# Patient Record
Sex: Male | Born: 1960 | Race: Black or African American | Hispanic: No | Marital: Married | Smoking: Former smoker
Health system: Southern US, Community
[De-identification: ages and names within clinical notes are randomized; demographics above are authoritative.]

## PROBLEM LIST (undated history)

## (undated) DIAGNOSIS — J45909 Unspecified asthma, uncomplicated: Secondary | ICD-10-CM

## (undated) HISTORY — PX: CERVICAL SPINE SURGERY: SHX589

---

## 2003-06-05 ENCOUNTER — Encounter: Payer: Self-pay | Admitting: Emergency Medicine

## 2003-06-05 ENCOUNTER — Emergency Department (HOSPITAL_COMMUNITY): Admission: EM | Admit: 2003-06-05 | Discharge: 2003-06-05 | Payer: Self-pay | Admitting: Emergency Medicine

## 2004-05-06 ENCOUNTER — Ambulatory Visit (HOSPITAL_COMMUNITY): Admission: RE | Admit: 2004-05-06 | Discharge: 2004-05-07 | Payer: Self-pay | Admitting: Neurosurgery

## 2004-05-26 ENCOUNTER — Encounter: Admission: RE | Admit: 2004-05-26 | Discharge: 2004-05-26 | Payer: Self-pay | Admitting: Neurosurgery

## 2004-09-29 IMAGING — RF DG CERVICAL SPINE 1V
1 series · 1 of 1 positions shown · non-contrast
Comparison: none

CLINICAL DATA: Foraminal stenosis.
 INTRAOPERATIVE CERVICAL FLUOROSCOPY

[Series 1: run · 1 of 1 slices shown]
[im 1/1]
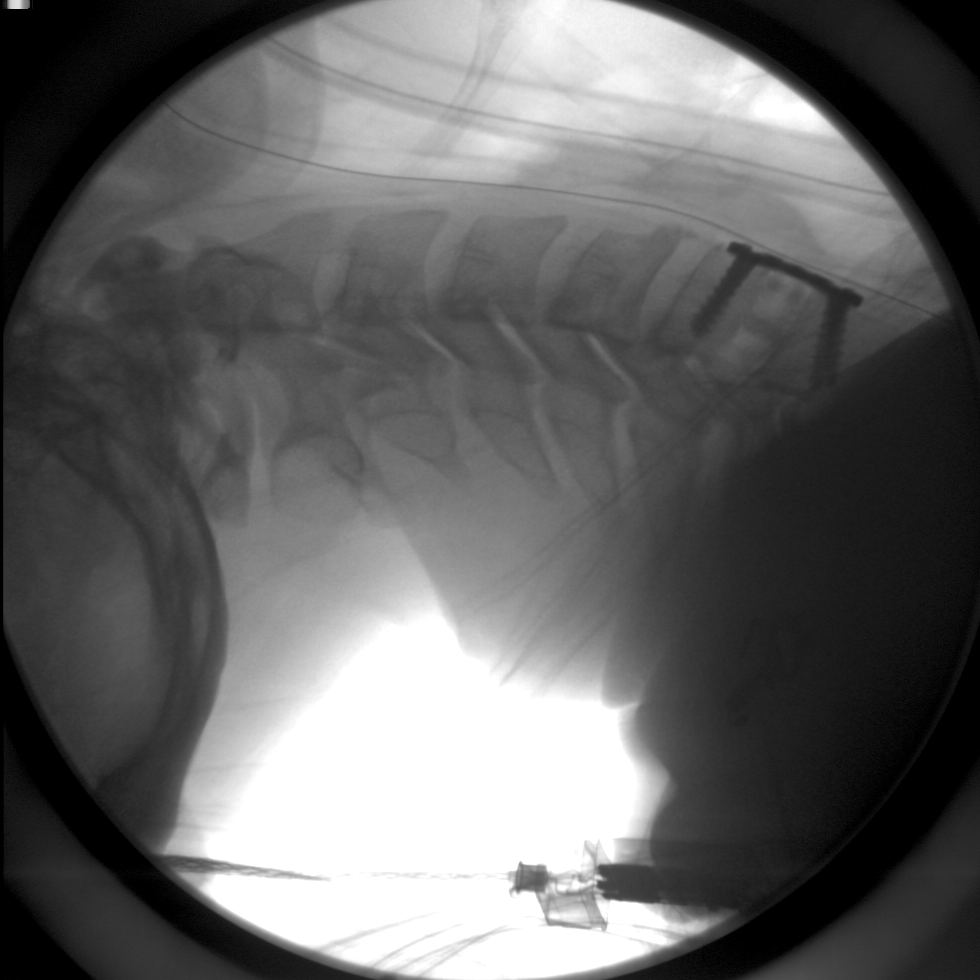

[1 of 1 positions shown; findings below may reference images not displayed]

FINDINGS: Submitted is a single image from intraoperative cervical spine fluoroscopy demonstrating changes of anterior cervical diskectomy and fusion at C6-7.  
 IMPRESSION
 Anterior cervical diskectomy and fusion at C6-7.

## 2005-01-27 ENCOUNTER — Emergency Department (HOSPITAL_COMMUNITY): Admission: EM | Admit: 2005-01-27 | Discharge: 2005-01-27 | Payer: Self-pay | Admitting: Emergency Medicine

## 2005-11-24 ENCOUNTER — Emergency Department (HOSPITAL_COMMUNITY): Admission: EM | Admit: 2005-11-24 | Discharge: 2005-11-24 | Payer: Self-pay | Admitting: Emergency Medicine

## 2006-08-17 ENCOUNTER — Emergency Department (HOSPITAL_COMMUNITY): Admission: EM | Admit: 2006-08-17 | Discharge: 2006-08-17 | Payer: Self-pay | Admitting: Emergency Medicine

## 2015-07-06 ENCOUNTER — Encounter (HOSPITAL_COMMUNITY): Payer: Self-pay | Admitting: *Deleted

## 2015-07-06 ENCOUNTER — Emergency Department (HOSPITAL_COMMUNITY)
Admission: EM | Admit: 2015-07-06 | Discharge: 2015-07-06 | Disposition: A | Discharge status: Home / Self Care | Payer: No Typology Code available for payment source | Attending: Emergency Medicine | Admitting: Emergency Medicine

## 2015-07-06 DIAGNOSIS — S199XXA Unspecified injury of neck, initial encounter: Secondary | ICD-10-CM | POA: Insufficient documentation

## 2015-07-06 DIAGNOSIS — J45909 Unspecified asthma, uncomplicated: Secondary | ICD-10-CM | POA: Diagnosis not present

## 2015-07-06 DIAGNOSIS — S4992XA Unspecified injury of left shoulder and upper arm, initial encounter: Secondary | ICD-10-CM | POA: Insufficient documentation

## 2015-07-06 DIAGNOSIS — S0990XA Unspecified injury of head, initial encounter: Secondary | ICD-10-CM | POA: Diagnosis present

## 2015-07-06 DIAGNOSIS — M542 Cervicalgia: Secondary | ICD-10-CM

## 2015-07-06 DIAGNOSIS — Y9389 Activity, other specified: Secondary | ICD-10-CM | POA: Insufficient documentation

## 2015-07-06 DIAGNOSIS — Y92410 Unspecified street and highway as the place of occurrence of the external cause: Secondary | ICD-10-CM | POA: Insufficient documentation

## 2015-07-06 DIAGNOSIS — Y998 Other external cause status: Secondary | ICD-10-CM | POA: Insufficient documentation

## 2015-07-06 DIAGNOSIS — Z720 Tobacco use: Secondary | ICD-10-CM | POA: Insufficient documentation

## 2015-07-06 DIAGNOSIS — R519 Headache, unspecified: Secondary | ICD-10-CM

## 2015-07-06 DIAGNOSIS — R51 Headache: Secondary | ICD-10-CM

## 2015-07-06 HISTORY — DX: Unspecified asthma, uncomplicated: J45.909

## 2015-07-06 MED ORDER — IBUPROFEN 400 MG PO TABS
400.0000 mg | ORAL_TABLET | Freq: Once | ORAL | Status: AC
  Administered 2015-07-06: 400 mg via ORAL
  Filled 2015-07-06: qty 2

## 2015-07-06 MED ORDER — METHOCARBAMOL 500 MG PO TABS: 500.0000 mg | ORAL_TABLET | Freq: Two times a day (BID) | ORAL | Status: DC

## 2015-07-06 MED ORDER — NAPROXEN 500 MG PO TABS: 500.0000 mg | ORAL_TABLET | Freq: Two times a day (BID) | ORAL | Status: DC
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
## 2015-07-06 NOTE — ED Notes (Signed)
PA at bedside.
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
## 2015-08-26 ENCOUNTER — Emergency Department (HOSPITAL_COMMUNITY): Payer: Self-pay

## 2015-08-26 ENCOUNTER — Emergency Department (HOSPITAL_COMMUNITY)
Admission: EM | Admit: 2015-08-26 | Discharge: 2015-08-26 | Disposition: A | Discharge status: Home / Self Care | Payer: Self-pay | Attending: Emergency Medicine | Admitting: Emergency Medicine

## 2015-08-26 ENCOUNTER — Encounter (HOSPITAL_COMMUNITY): Payer: Self-pay | Admitting: Emergency Medicine

## 2015-08-26 DIAGNOSIS — F17200 Nicotine dependence, unspecified, uncomplicated: Secondary | ICD-10-CM | POA: Insufficient documentation

## 2015-08-26 DIAGNOSIS — M545 Low back pain, unspecified: Secondary | ICD-10-CM

## 2015-08-26 DIAGNOSIS — J45909 Unspecified asthma, uncomplicated: Secondary | ICD-10-CM | POA: Insufficient documentation

## 2015-08-26 DIAGNOSIS — Z79899 Other long term (current) drug therapy: Secondary | ICD-10-CM | POA: Insufficient documentation

## 2015-08-26 MED ORDER — NAPROXEN 500 MG PO TABS: 500.0000 mg | ORAL_TABLET | Freq: Two times a day (BID) | ORAL | Status: DC

## 2015-08-26 MED ORDER — KETOROLAC TROMETHAMINE 60 MG/2ML IM SOLN
60.0000 mg | Freq: Once | INTRAMUSCULAR | Status: AC
  Administered 2015-08-26: 60 mg via INTRAMUSCULAR
  Filled 2015-08-26: qty 2

## 2015-08-26 MED ORDER — OXYCODONE-ACETAMINOPHEN 5-325 MG PO TABS: 2.0000 | ORAL_TABLET | ORAL | Status: DC | PRN

## 2015-08-26 MED ORDER — DIAZEPAM 5 MG PO TABS
5.0000 mg | ORAL_TABLET | Freq: Once | ORAL | Status: AC
  Administered 2015-08-26: 5 mg via ORAL
  Filled 2015-08-26: qty 1
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
## 2015-08-26 NOTE — Discharge Instructions (Signed)
Back Pain, Adult °Back pain is very common in adults. The cause of back pain is rarely dangerous and the pain often gets better over time. The cause of your back pain may not be known. Some common causes of back pain include: °· Strain of the muscles or ligaments supporting the spine. °· Wear and tear (degeneration) of the spinal disks. °· Arthritis. °· Direct injury to the back. °For many people, back pain may return. Since back pain is rarely dangerous, most people can learn to manage this condition on their own. °HOME CARE INSTRUCTIONS °Watch your back pain for any changes. The following actions may help to lessen any discomfort you are feeling: °· Remain active. It is stressful on your back to sit or stand in one place for long periods of time. Do not sit, drive, or stand in one place for more than 30 minutes at a time. Take short walks on even surfaces as soon as you are able. Try to increase the length of time you walk each day. °· Exercise regularly as directed by your health care provider. Exercise helps your back heal faster. It also helps avoid future injury by keeping your muscles strong and flexible. °· Do not stay in bed. Resting more than 1-2 days can delay your recovery. °· Pay attention to your body when you bend and lift. The most comfortable positions are those that put less stress on your recovering back. Always use proper lifting techniques, including: °¨ Bending your knees. °¨ Keeping the load close to your body. °¨ Avoiding twisting. °· Find a comfortable position to sleep. Use a firm mattress and lie on your side with your knees slightly bent. If you lie on your back, put a pillow under your knees. °· Avoid feeling anxious or stressed. Stress increases muscle tension and can worsen back pain. It is important to recognize when you are anxious or stressed and learn ways to manage it, such as with exercise. °· Take medicines only as directed by your health care provider. Over-the-counter  medicines to reduce pain and inflammation are often the most helpful. Your health care provider may prescribe muscle relaxant drugs. These medicines help dull your pain so you can more quickly return to your normal activities and healthy exercise. °· Apply ice to the injured area: °¨ Put ice in a plastic bag. °¨ Place a towel between your skin and the bag. °¨ Leave the ice on for 20 minutes, 2-3 times a day for the first 2-3 days. After that, ice and heat may be alternated to reduce pain and spasms. °· Maintain a healthy weight. Excess weight puts extra stress on your back and makes it difficult to maintain good posture. °SEEK MEDICAL CARE IF: °· You have pain that is not relieved with rest or medicine. °· You have increasing pain going down into the legs or buttocks. °· You have pain that does not improve in one week. °· You have night pain. °· You lose weight. °· You have a fever or chills. °SEEK IMMEDIATE MEDICAL CARE IF:  °· You develop new bowel or bladder control problems. °· You have unusual weakness or numbness in your arms or legs. °· You develop nausea or vomiting. °· You develop abdominal pain. °· You feel faint. °  °This information is not intended to replace advice given to you by your health care provider. Make sure you discuss any questions you have with your health care provider. °  °Document Released: 08/22/2005 Document Revised: 09/12/2014 Document Reviewed: 12/24/2013 °Elsevier Interactive Patient Education ©2016 Elsevier   Inc. ° °Emergency Department Resource Guide °1) Find a Doctor and Pay Out of Pocket °Although you won't have to find out who is covered by your insurance plan, it is a good idea to ask around and get recommendations. You will then need to call the office and see if the doctor you have chosen will accept you as a new patient and what types of options they offer for patients who are self-pay. Some doctors offer discounts or will set up payment plans for their patients who do not  have insurance, but you will need to ask so you aren't surprised when you get to your appointment. ° °2) Contact Your Local Health Department °Not all health departments have doctors that can see patients for sick visits, but many do, so it is worth a call to see if yours does. If you don't know where your local health department is, you can check in your phone book. The CDC also has a tool to help you locate your state's health department, and many state websites also have listings of all of their local health departments. ° °3) Find a Walk-in Clinic °If your illness is not likely to be very severe or complicated, you may want to try a walk in clinic. These are popping up all over the country in pharmacies, drugstores, and shopping centers. They're usually staffed by nurse practitioners or physician assistants that have been trained to treat common illnesses and complaints. They're usually fairly quick and inexpensive. However, if you have serious medical issues or chronic medical problems, these are probably not your best option. ° °No Primary Care Doctor: °- Call [facility] at  832-8000 - they can help you locate a primary care doctor that  accepts your insurance, provides certain services, etc. °- Physician Referral Service- 1-800-533-3463 ° °Chronic Pain Problems: °Organization         Address  Phone   Notes  °[facility]  (336) 297-2271 Patients need to be referred by their primary care doctor.  ° °Medication Assistance: °Organization         Address  Phone   Notes  °[place] [facility] [address] °[place], [place] [address] (336) 641-8030 --Must be a resident of [place] °-- Must have NO insurance coverage whatsoever (no Medicaid/ Medicare, etc.) °-- The pt. MUST have a primary care doctor that directs their care regularly and follows them in the community °  °[facility]  (866) 331-1348   °[employer]  (888) 892-1162   ° °Agencies that  provide inexpensive medical care: °Organization         Address  Phone   Notes  °[facility]  (336) 832-8035   °[facility]    (336) 832-7272   °[facility] [address] °[place], [place] [address] (336) 832-4777   °[facility] [address], °[place] (336) 271-4999   °[facility]    (336) 373-0678   °[facility]    (336) 272-1050   °[facility] ° [address], [place] Phone:  (336) 832-4444, Fax:  (336) 832-4440 Hours of Operation:  9 am - 6 pm, M-F.  Also accepts Medicaid/Medicare and self-pay.  °[facility] ° [address], [place] Phone: (336) 832-3150, Fax: (336) 832-3151. Hours of Operation:  8:30 am - 5:30 pm, M-F.  Also accepts Medicaid and self-pay.  °[facility]   [place] [address], [place] Phone: (336) 878-6027   °[facility] [address], [place], [place] (336)723-1848, Ext. 123 Mondays & Thursdays: 7-9 AM.  First 15 patients are seen on a first come, first serve basis. °  ° °Medicaid-accepting [place] Providers: ° °Organization         Address  Phone   Notes  °[facility] [address], [place] (336) 641-2100 Also accepts self-pay patients.  °[facility] [address], [place] ° (336) 856-9996   °[facility] [address], [place] (336) 288-8857   °[facility] [address] [place] [address], [place] (336) 299-7000   °Veita Bland [address], [place]  ° (336) 373-1557 Only accepts [place] Access Medicaid patients after they have their name applied to their card.  ° °Self-Pay (no insurance) in [place]: ° °Organization         Address  Phone   Notes  °Sickle Cell Patients, [facility] [address], [place] (336) 832-1970   °[facility] [address], [place] (336) 832-4400   °[facility] ° [address], [place] (336) 992-4800   °[facility]/Dr. Osei-Bonsu ° [address] [place] [address], [place] or [address], [place] (336) 841-8500 Phone number for both [place] and [place] locations is the same.  °[facility] [address], [place] (336) 299-0000   °[facility] [address] [place] [address], [place] or [address] (336) 852-7530 °(336) 878-2260   °[facility] [address], [place] (336) 350-1642, phone; (336) 294-5005, fax Sees patients 1st and 3rd Saturday of every month.  Must not qualify for public or private insurance (i.e. Medicaid, Medicare, [place] Health Choice, Veterans' Benefits) • Household income should be no more than 200% of the poverty level •The clinic cannot treat you if you are pregnant or think you are pregnant • Sexually transmitted diseases are not treated at the clinic.  ° ° °Dental Care: °Organization         Address  Phone  Notes  °[facility] [address], [place] (336) 641-6152 Accepts children up to age 21 who are enrolled in Medicaid or [place] Health Choice; pregnant women with a Medicaid card; and children who have applied for Medicaid or [place] Health Choice, but were declined, whose parents can pay a reduced fee at time of service.  °[facility] [place]  [address], [place] (336) 641-7733 Accepts children up to age 21 who are enrolled in Medicaid or [place] Health Choice; pregnant women with a Medicaid card; and children who have applied for Medicaid or  Health Choice, but were declined, whose parents can pay a reduced fee at time of service.  °[facility] ° [address], [place] (336) 641-4533 Patients are seen by appointment only. Walk-ins  are not accepted. [facility] will see patients 18 years of age and older. °Monday - Tuesday (8am-5pm) °Most Wednesdays (8:30-5pm) °$30 per visit, cash only  °[facility] ° [address], [place] (336) 641-4533 Patients are seen by appointment only. Walk-ins are not accepted. [facility] will see patients 18 years of age   and older. °One Wednesday Evening (Monthly: Volunteer Based).  $30 per visit, cash only  °[facility]  (919) 537-3737 for adults; Children under age 4, call Graduate Pediatric Dentistry at (919) [phone]. Children aged 4-14, please call (919) 537-3737 to request a pediatric application. ° Dental services are provided in all areas of dental care including fillings, crowns and bridges, complete and partial dentures, implants, gum treatment, root canals, and extractions. Preventive care is also provided. Treatment is provided to both adults and children. °Patients are selected via a lottery and there is often a waiting list. °  °[facility] [address], °[place] ° (336) 763-8833 [email or web address] °  °[facility] [address], [place], [place] (336)723-1848, Ext. 123 Second and Fourth Thursday of each month, opens at 6:30 AM; Clinic ends at 9 AM.  Patients are seen on a first-come first-served basis, and a limited number are seen during each clinic.  ° °[facility] ° [address], [place], [place] (336) 723-7904   Eligibility Requirements °You must have lived in [place], [place], or [place] counties for at least the last three months. °  You cannot be eligible for state or federal sponsored healthcare insurance, including Veterans Administration, Medicaid, or Medicare. °  You generally cannot be eligible for healthcare insurance through your employer.  °  How to apply: °Eligibility screenings are held every Tuesday and Wednesday afternoon from 1:00 pm until 4:00 pm. You do not need an appointment  for the interview!  °[facility] [address], [place], [place] 336-631-2330   °[facility]  336-342-8273   °[facility]  336-703-3100   °[facility]  336-570-6415   ° °Behavioral Health Resources in the Community: °Intensive Outpatient Programs °Organization         Address  Phone  Notes  °[facility] [address], [place], [place] 336-878-6098   °[facility] [address], [place], [place] 336-832-9800   °[facility] [address], [place], [place] ° 336-882-2125   °[facility] [address],  °[place], [place] 1-800-853-5163 or 336-641-4981   °Substance Abuse Resources °Organization         Address  Phone  Notes  °[facility]  336-882-2125   °[facility]  336-784-9470   °[facility]  336-285-9073   °[facility]  336-845-3988   °Residential & Outpatient Substance Abuse Program  1-800-659-3381   °Psychological Services °Organization         Address  Phone  Notes  °[facility]  336- 832-9600   °[facility]  336- 378-7881   °[facility] [address], [place] 1-800-853-5163 or 336-641-4981   ° °Mobile Crisis Teams °Organization         Address  Phone  Notes  °[facility], Mobile Crisis Care Unit  1-877-626-1772   °Assertive °[facility] ° [address] [place], [place] 336-834-9664   °Sharon DeEsch [address] °[place] 336-554-5454   ° °Self-Help/Support Groups °Organization         Address  Phone             Notes  °[facility]  - variety of support groups  336- 373-1402 Call for more information  °Narcotics Anonymous (NA), [facility] [address], °[place]  2 meetings at   this location  ° °Residential Treatment Programs °Organization         Address  Phone  Notes  °[facility] [address],    °[place]  1-866-801-8205   °[facility] ° [address], [place], [place] 704-293-8524   °[facility] [address], [place] 336-845-3988 Admissions: 8am-3pm M-F  °[facility] [address]    °[place], [place] 336-841-1104   °[facility] [address], [place], [place] 336-379-7146   °[facility] [address]  °[place], [place] 336-285-9073   °[facility] Intensive Outpatient [address], [place], [place] 336-852-3033   °[facility] [address]  °[place], [place] 1-877-615-2722 or 336-784-9470   °[facility] [address] [place], [place] 336-227-7417 Accepts Medicaid  °[facility] [address]  °[place] 1-800-659-3381 Substance Abuse/Addiction Treatment  ° °[place] Behavioral Health Resources °Organization         Address  Phone  Notes  °[facility]  (888) 581-9988   °Julie Brannon, PhD [address] [place], [place]   (336) 349-5553 or (336) 951-0000   °[facility]   [address] °[place], [place] (336) 349-4454   °[facility] [address], [place], [place] (336) 342-8316 Insurance/Medicaid/sponsorship through [facility]  °[facility] [address]                                    [place], [place] (336) 342-8316 Therapy/tele-psych/case  °[facility] [address]  ° [place], [place] (336) 349-2233    °Dr. Arfeen  (336) 349-4544   °[facility] [place]  [employer] [place] [facility] 1) [address], [place] °2) [address], [place] °3)  [address], [place] (336) 349-3220 °(336) 342-7768 ° °(336) 342-8140   °[place] [facility] (336) 342-1394 or (336) 342-3537 (After Hours)    ° ° ° °
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
## 2015-11-08 ENCOUNTER — Emergency Department (HOSPITAL_COMMUNITY)
Admission: EM | Admit: 2015-11-08 | Discharge: 2015-11-08 | Disposition: A | Discharge status: Home / Self Care | Payer: Self-pay | Attending: Emergency Medicine | Admitting: Emergency Medicine

## 2015-11-08 ENCOUNTER — Encounter (HOSPITAL_COMMUNITY): Payer: Self-pay | Admitting: *Deleted

## 2015-11-08 ENCOUNTER — Emergency Department (HOSPITAL_COMMUNITY): Payer: Self-pay

## 2015-11-08 DIAGNOSIS — J45909 Unspecified asthma, uncomplicated: Secondary | ICD-10-CM | POA: Insufficient documentation

## 2015-11-08 DIAGNOSIS — R42 Dizziness and giddiness: Secondary | ICD-10-CM | POA: Insufficient documentation

## 2015-11-08 DIAGNOSIS — Z79899 Other long term (current) drug therapy: Secondary | ICD-10-CM | POA: Insufficient documentation

## 2015-11-08 DIAGNOSIS — H5500 Unspecified nystagmus: Secondary | ICD-10-CM | POA: Insufficient documentation

## 2015-11-08 DIAGNOSIS — R1110 Vomiting, unspecified: Secondary | ICD-10-CM | POA: Insufficient documentation

## 2015-11-08 DIAGNOSIS — F17200 Nicotine dependence, unspecified, uncomplicated: Secondary | ICD-10-CM | POA: Insufficient documentation

## 2015-11-08 LAB — COMPREHENSIVE METABOLIC PANEL
ALK PHOS: 71 U/L (ref 38–126)
ALT: 21 U/L (ref 17–63)
ANION GAP: 8 (ref 5–15)
AST: 28 U/L (ref 15–41)
Albumin: 3.3 g/dL — ABNORMAL LOW (ref 3.5–5.0)
BUN: 7 mg/dL (ref 6–20)
CALCIUM: 8.7 mg/dL — AB (ref 8.9–10.3)
CO2: 26 mmol/L (ref 22–32)
CREATININE: 1.12 mg/dL (ref 0.61–1.24)
Chloride: 105 mmol/L (ref 101–111)
Glucose, Bld: 103 mg/dL — ABNORMAL HIGH (ref 65–99)
Potassium: 4.7 mmol/L (ref 3.5–5.1)
SODIUM: 139 mmol/L (ref 135–145)
Total Bilirubin: 0.4 mg/dL (ref 0.3–1.2)
Total Protein: 5.7 g/dL — ABNORMAL LOW (ref 6.5–8.1)

## 2015-11-08 LAB — DIFFERENTIAL
BASOS PCT: 0 %
Basophils Absolute: 0 10*3/uL (ref 0.0–0.1)
EOS PCT: 2 %
Eosinophils Absolute: 0.1 10*3/uL (ref 0.0–0.7)
LYMPHS PCT: 43 %
Lymphs Abs: 1.3 10*3/uL (ref 0.7–4.0)
MONO ABS: 0.2 10*3/uL (ref 0.1–1.0)
MONOS PCT: 8 %
Neutro Abs: 1.4 10*3/uL — ABNORMAL LOW (ref 1.7–7.7)
Neutrophils Relative %: 47 %

## 2015-11-08 LAB — CBC
HEMATOCRIT: 46.4 % (ref 39.0–52.0)
Hemoglobin: 16.2 g/dL (ref 13.0–17.0)
MCH: 33.3 pg (ref 26.0–34.0)
MCHC: 34.9 g/dL (ref 30.0–36.0)
MCV: 95.3 fL (ref 78.0–100.0)
PLATELETS: 240 10*3/uL (ref 150–400)
RBC: 4.87 MIL/uL (ref 4.22–5.81)
RDW: 14 % (ref 11.5–15.5)
WBC: 3.1 10*3/uL — ABNORMAL LOW (ref 4.0–10.5)

## 2015-11-08 LAB — PROTIME-INR
INR: 0.98 (ref 0.00–1.49)
PROTHROMBIN TIME: 13.2 s (ref 11.6–15.2)

## 2015-11-08 LAB — I-STAT CHEM 8, ED
BUN: 9 mg/dL (ref 6–20)
CREATININE: 1 mg/dL (ref 0.61–1.24)
Calcium, Ion: 1.22 mmol/L (ref 1.12–1.23)
Chloride: 104 mmol/L (ref 101–111)
GLUCOSE: 96 mg/dL (ref 65–99)
HEMATOCRIT: 51 % (ref 39.0–52.0)
HEMOGLOBIN: 17.3 g/dL — AB (ref 13.0–17.0)
Potassium: 4.7 mmol/L (ref 3.5–5.1)
Sodium: 141 mmol/L (ref 135–145)
TCO2: 23 mmol/L (ref 0–100)

## 2015-11-08 LAB — APTT: aPTT: 28 seconds (ref 24–37)

## 2015-11-08 LAB — I-STAT TROPONIN, ED: TROPONIN I, POC: 0 ng/mL (ref 0.00–0.08)

## 2015-11-08 MED ORDER — ONDANSETRON 4 MG PO TBDP: 4.0000 mg | ORAL_TABLET | Freq: Three times a day (TID) | ORAL | Status: DC | PRN

## 2015-11-08 MED ORDER — ONDANSETRON 4 MG PO TBDP: 4.0000 mg | ORAL_TABLET | Freq: Once | ORAL | Status: DC

## 2015-11-08 MED ORDER — SODIUM CHLORIDE 0.9 % IV BOLUS (SEPSIS)
1000.0000 mL | Freq: Once | INTRAVENOUS | Status: AC
  Administered 2015-11-08: 1000 mL via INTRAVENOUS

## 2015-11-08 MED ORDER — MECLIZINE HCL 25 MG PO TABS: 25.0000 mg | ORAL_TABLET | Freq: Once | ORAL | Status: DC

## 2015-11-08 MED ORDER — MECLIZINE HCL 25 MG PO TABS
25.0000 mg | ORAL_TABLET | Freq: Once | ORAL | Status: AC
  Administered 2015-11-08: 25 mg via ORAL
  Filled 2015-11-08: qty 1

## 2015-11-08 MED ORDER — MECLIZINE HCL 25 MG PO TABS
25.0000 mg | ORAL_TABLET | Freq: Two times a day (BID) | ORAL | Status: AC
Start: 2015-11-08 — End: 2015-11-22

## 2015-11-08 MED ORDER — ONDANSETRON 4 MG PO TBDP
4.0000 mg | ORAL_TABLET | Freq: Once | ORAL | Status: AC
  Administered 2015-11-08: 4 mg via ORAL
  Filled 2015-11-08: qty 1

## 2015-11-08 NOTE — Discharge Instructions (Signed)
Benign Positional Vertigo Vertigo is the feeling that you or your surroundings are moving when they are not. Benign positional vertigo is the most common form of vertigo. The cause of this condition is not serious (is benign). This condition is triggered by certain movements and positions (is positional). This condition can be dangerous if it occurs while you are doing something that could endanger you or others, such as driving.  CAUSES In many cases, the cause of this condition is not known. It may be caused by a disturbance in an area of the inner ear that helps your brain to sense movement and balance. This disturbance can be caused by a viral infection (labyrinthitis), head injury, or repetitive motion. RISK FACTORS This condition is more likely to develop in:  Women.  People who are 50 years of age or older. SYMPTOMS Symptoms of this condition usually happen when you move your head or your eyes in different directions. Symptoms may start suddenly, and they usually last for less than a minute. Symptoms may include:  Loss of balance and falling.  Feeling like you are spinning or moving.  Feeling like your surroundings are spinning or moving.  Nausea and vomiting.  Blurred vision.  Dizziness.  Involuntary eye movement (nystagmus). Symptoms can be mild and cause only slight annoyance, or they can be severe and interfere with daily life. Episodes of benign positional vertigo may return (recur) over time, and they may be triggered by certain movements. Symptoms may improve over time. DIAGNOSIS This condition is usually diagnosed by medical history and a physical exam of the head, neck, and ears. You may be referred to a health care provider who specializes in ear, nose, and throat (ENT) problems (otolaryngologist) or a provider who specializes in disorders of the nervous system (neurologist). You may have additional testing, including:  MRI.  A CT scan.  Eye movement tests. Your  health care provider may ask you to change positions quickly while he or she watches you for symptoms of benign positional vertigo, such as nystagmus. Eye movement may be tested with an electronystagmogram (ENG), caloric stimulation, the Dix-Hallpike test, or the roll test.  An electroencephalogram (EEG). This records electrical activity in your brain.  Hearing tests. TREATMENT Usually, your health care provider will treat this by moving your head in specific positions to adjust your inner ear back to normal. Surgery may be needed in severe cases, but this is rare. In some cases, benign positional vertigo may resolve on its own in 2-4 weeks. HOME CARE INSTRUCTIONS Safety  Move slowly.Avoid sudden body or head movements.  Avoid driving.  Avoid operating heavy machinery.  Avoid doing any tasks that would be dangerous to you or others if a vertigo episode would occur.  If you have trouble walking or keeping your balance, try using a cane for stability. If you feel dizzy or unstable, sit down right away.  Return to your normal activities as told by your health care provider. Ask your health care provider what activities are safe for you. General Instructions  Take over-the-counter and prescription medicines only as told by your health care provider.  Avoid certain positions or movements as told by your health care provider.  Drink enough fluid to keep your urine clear or pale yellow.  Keep all follow-up visits as told by your health care provider. This is important. SEEK MEDICAL CARE IF:  You have a fever.  Your condition gets worse or you develop new symptoms.  Your family or friends   notice any behavioral changes.  Your nausea or vomiting gets worse.  You have numbness or a "pins and needles" sensation. SEEK IMMEDIATE MEDICAL CARE IF:  You have difficulty speaking or moving.  You are always dizzy.  You faint.  You develop severe headaches.  You have weakness in your  legs or arms.  You have changes in your hearing or vision.  You develop a stiff neck.  You develop sensitivity to light.   This information is not intended to replace advice given to you by your health care provider. Make sure you discuss any questions you have with your health care provider.   Document Released: 05/30/2006 Document Revised: 05/13/2015 Document Reviewed: 12/15/2014 Elsevier Interactive Patient Education 2016 [employer]  

## 2015-11-08 NOTE — ED Notes (Signed)
Pt reports onset of dizziness yesterday. "feels like room is spinning" and is causing n/v.

## 2015-11-08 NOTE — ED Provider Notes (Signed)
CSN: [id number]     Arrival date & time 11/08/15  1408 History   First MD Initiated Contact with Patient 11/08/15 1708     Chief Complaint  Patient presents with  . Dizziness  . Emesis   Patient is a 55 y.o. male presenting with dizziness. The history is provided by the patient. No language interpreter was used.  Dizziness Quality:  Head spinning and room spinning Severity:  Moderate Onset quality:  Sudden Duration:  1 day Timing:  Constant Progression:  Unchanged Chronicity:  New Context: head movement   Relieved by:  None tried Worsened by:  Nothing Ineffective treatments:  None tried Associated symptoms: vomiting   Associated symptoms: no blood in stool, no chest pain, no diarrhea, no headaches, no hearing loss, no nausea, no palpitations, no shortness of breath, no syncope and no weakness   Risk factors: no hx of stroke and no new medications     Past Medical History  Diagnosis Date  . Asthma    Past Surgical History  Procedure Laterality Date  . Cervical spine surgery     History reviewed. No pertinent family history. Social History  Substance Use Topics  . Smoking status: Current Every Day Smoker  . Smokeless tobacco: Never Used  . Alcohol Use: Yes    Review of Systems  Constitutional: Negative for fever, chills, activity change and appetite change.  HENT: Negative for congestion, dental problem, ear pain, facial swelling, hearing loss, rhinorrhea, sneezing, sore throat, trouble swallowing and voice change.   Eyes: Negative for photophobia, pain, redness and visual disturbance.  Respiratory: Negative for apnea, cough, chest tightness, shortness of breath, wheezing and stridor.   Cardiovascular: Negative for chest pain, palpitations, leg swelling and syncope.  Gastrointestinal: Positive for vomiting. Negative for nausea, abdominal pain, diarrhea, constipation, blood in stool and abdominal distention.  Endocrine: Negative for polydipsia and polyuria.   Genitourinary: Negative for frequency, hematuria, flank pain, decreased urine volume and difficulty urinating.  Musculoskeletal: Negative for back pain, joint swelling, gait problem, neck pain and neck stiffness.  Skin: Negative for rash and wound.  Allergic/Immunologic: Negative for immunocompromised state.  Neurological: Positive for dizziness. Negative for syncope, facial asymmetry, speech difficulty, weakness, light-headedness, numbness and headaches.  Hematological: Negative for adenopathy.  Psychiatric/Behavioral: Negative for suicidal ideas, behavioral problems, confusion, sleep disturbance and agitation. The patient is not nervous/anxious.   All other systems reviewed and are negative.     Allergies  Iodine; Shellfish allergy; and Other  Home Medications   Prior to Admission medications   Medication Sig Start Date End Date Taking? Authorizing Provider  Multiple Vitamin (MULTIVITAMIN WITH MINERALS) TABS tablet Take 1 tablet by mouth daily.   Yes Historical Provider, MD  meclizine (ANTIVERT) 25 MG tablet Take 1 tablet (25 mg total) by mouth 2 (two) times daily. 11/08/15 11/22/15  Vira Blanco, MD  methocarbamol (ROBAXIN) 500 MG tablet Take 1 tablet (500 mg total) by mouth 2 (two) times daily. Patient not taking: Reported on 11/08/2015 07/06/15   Stevi Barrett, PA-C  naproxen (NAPROSYN) 500 MG tablet Take 1 tablet (500 mg total) by mouth 2 (two) times daily. Patient not taking: Reported on 11/08/2015 08/26/15   Ottie Glazier, PA-C  ondansetron (ZOFRAN-ODT) 4 MG disintegrating tablet Take 1 tablet (4 mg total) by mouth every 8 (eight) hours as needed for nausea or vomiting. 11/08/15   Vira Blanco, MD  oxyCODONE-acetaminophen (PERCOCET/ROXICET) 5-325 MG tablet Take 2 tablets by mouth every 4 (four) hours as needed for severe pain. Patient  not taking: Reported on 11/08/2015 08/26/15   Hanna Patel-Mills, PA-C   BP 126/85 mmHg  Pulse 64  Temp(Src) 97.9 F (36.6 C) (Oral)  Resp 16   SpO2 97% Physical Exam  Constitutional: He is oriented to person, place, and time. He appears well-developed and well-nourished. No distress.  HENT:  Head: Normocephalic and atraumatic.  Right Ear: External ear normal.  Left Ear: External ear normal.  Eyes: Pupils are equal, round, and reactive to light. Right eye exhibits no discharge. Left eye exhibits no discharge. Right eye exhibits nystagmus. Left eye exhibits nystagmus.  Neck: Normal range of motion. No JVD present. No tracheal deviation present.  Cardiovascular: Normal rate, regular rhythm and normal heart sounds.  Exam reveals no friction rub.   No murmur heard. Pulmonary/Chest: Effort normal and breath sounds normal. No stridor. No respiratory distress. He has no wheezes.  Abdominal: Soft. Bowel sounds are normal. He exhibits no distension. There is no rebound and no guarding.  Musculoskeletal: Normal range of motion. He exhibits no edema or tenderness.  Lymphadenopathy:    He has no cervical adenopathy.  Neurological: He is alert and oriented to person, place, and time. No cranial nerve deficit. Coordination normal.  Normal finger to nose. Slowed rapid alternating movements. Normal heel-to-shin.  Positive Dix-Hallpike maneuver to the right.  Skin: Skin is warm and dry. No rash noted. No pallor.  Psychiatric: He has a normal mood and affect. His behavior is normal. Judgment and thought content normal.  Nursing note and vitals reviewed.   ED Course  Procedures (including critical care time) Labs Review Labs Reviewed  CBC - Abnormal; Notable for the following:    WBC 3.1 (*)    All other components within normal limits  DIFFERENTIAL - Abnormal; Notable for the following:    Neutro Abs 1.4 (*)    All other components within normal limits  COMPREHENSIVE METABOLIC PANEL - Abnormal; Notable for the following:    Glucose, Bld 103 (*)    Calcium 8.7 (*)    Total Protein 5.7 (*)    Albumin 3.3 (*)    All other components  within normal limits  I-STAT CHEM 8, ED - Abnormal; Notable for the following:    Hemoglobin 17.3 (*)    All other components within normal limits  PROTIME-INR  APTT  I-STAT TROPOININ, ED  CBG MONITORING, ED    Imaging Review Mr Brain Wo Contrast  11/08/2015  CLINICAL DATA:  Initial evaluation for sudden onset of dizziness yesterday. Evaluate for posterior circulation stroke. EXAM: MRI HEAD WITHOUT CONTRAST TECHNIQUE: Multiplanar, multiecho pulse sequences of the brain and surrounding structures were obtained without intravenous contrast. COMPARISON:  None. FINDINGS: Cerebral volume within normal limits for patient age. No focal parenchymal signal abnormality identified. No significant white matter disease. No abnormal foci of restricted diffusion to suggest acute intracranial infarct. Gray-white matter differentiation well maintained. Major intracranial vascular flow voids preserved. Distal right vertebral artery mildly tortuous and indents upon the brainstem. No acute or chronic intracranial hemorrhage. No areas of chronic infarction. No mass lesion, midline shift, or mass effect. No hydrocephalus. No extra-axial fluid collection. Major dural sinuses are patent. Craniocervical junction within normal limits. Visualized upper cervical spine unremarkable. Pituitary gland normal.  No acute abnormality about the orbits. Paranasal sinuses are clear. No mastoid effusion. Inner ear structures grossly normal. Bone marrow signal intensity within normal limits. No scalp soft tissue abnormality. IMPRESSION: Normal brain MRI. No acute intracranial infarct or other process identified. Electronically Signed  By: Jeannine Boga M.D.   On: 11/08/2015 21:55   I have personally reviewed and evaluated these images and lab results as part of my medical decision-making.   EKG Interpretation   Date/Time:  Sunday November 08 2015 14:47:15 EST Ventricular Rate:  60 PR Interval:  140 QRS Duration: 82 QT Interval:   410 QTC Calculation: 410 R Axis:   87 Text Interpretation:  Normal sinus rhythm ST elevation, consider early  repolarization, pericarditis, or injury Abnormal ECG Confirmed by Hazle Coca [phone]) on 11/08/2015 2:51:53 PM      MDM   Final diagnoses:  Vertigo    A patient with 2 days of dizziness he describes as room spinning. This causing nausea vomiting. Patient has normal vital signs. He has horizontal nystagmus but also difficulty with rapid alternating movements.  Differential diagnosis includes posterior circulation stroke versus BPPV.  MRI brain with no findings of stroke. BMP, troponin, INR, CBC unremarkable.  Patient given meclizine, normal saline bolus, Zofran with resolution of his symptoms.  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 He is  prescribed Zofran and meclizine at home and was given contact information establish care with PCP through [facility] and wellness.   Patient ambulatory no acute distress at time of discharge. I discussed case my attending, Dr.Delo.     Vira Blanco, MD 11/08/15 4818  Veryl Speak, MD 11/09/15 [phone]

## 2019-10-03 ENCOUNTER — Ambulatory Visit: Payer: Self-pay | Attending: Internal Medicine

## 2019-10-03 DIAGNOSIS — Z20822 Contact with and (suspected) exposure to covid-19: Secondary | ICD-10-CM | POA: Insufficient documentation

## 2019-10-04 LAB — NOVEL CORONAVIRUS, NAA: SARS-CoV-2, NAA: NOT DETECTED

## 2019-10-07 ENCOUNTER — Telehealth: Payer: Self-pay | Admitting: General Practice

## 2019-10-07 NOTE — Telephone Encounter (Signed)
Negative COVID results given. Patient results "NOT Detected." Caller expressed understanding. ° °

## 2020-11-09 ENCOUNTER — Inpatient Hospital Stay (HOSPITAL_COMMUNITY)
Admission: EM | Admit: 2020-11-09 | Discharge: 2020-11-19 | DRG: 853 | Disposition: A | Discharge status: Home / Self Care | Payer: Medicaid Other | Attending: Family Medicine | Admitting: Family Medicine

## 2020-11-09 ENCOUNTER — Emergency Department (HOSPITAL_COMMUNITY): Payer: Medicaid Other

## 2020-11-09 ENCOUNTER — Other Ambulatory Visit: Payer: Self-pay

## 2020-11-09 ENCOUNTER — Encounter (HOSPITAL_COMMUNITY): Payer: Self-pay

## 2020-11-09 DIAGNOSIS — Z9689 Presence of other specified functional implants: Secondary | ICD-10-CM

## 2020-11-09 DIAGNOSIS — I309 Acute pericarditis, unspecified: Secondary | ICD-10-CM | POA: Diagnosis present

## 2020-11-09 DIAGNOSIS — J939 Pneumothorax, unspecified: Secondary | ICD-10-CM

## 2020-11-09 DIAGNOSIS — I472 Ventricular tachycardia: Secondary | ICD-10-CM | POA: Diagnosis not present

## 2020-11-09 DIAGNOSIS — Z91041 Radiographic dye allergy status: Secondary | ICD-10-CM

## 2020-11-09 DIAGNOSIS — E041 Nontoxic single thyroid nodule: Secondary | ICD-10-CM | POA: Diagnosis present

## 2020-11-09 DIAGNOSIS — I3139 Other pericardial effusion (noninflammatory): Secondary | ICD-10-CM

## 2020-11-09 DIAGNOSIS — E871 Hypo-osmolality and hyponatremia: Secondary | ICD-10-CM | POA: Diagnosis present

## 2020-11-09 DIAGNOSIS — I313 Pericardial effusion (noninflammatory): Principal | ICD-10-CM | POA: Diagnosis present

## 2020-11-09 DIAGNOSIS — Z681 Body mass index (BMI) 19 or less, adult: Secondary | ICD-10-CM

## 2020-11-09 DIAGNOSIS — K769 Liver disease, unspecified: Secondary | ICD-10-CM | POA: Diagnosis present

## 2020-11-09 DIAGNOSIS — R590 Localized enlarged lymph nodes: Secondary | ICD-10-CM

## 2020-11-09 DIAGNOSIS — Z981 Arthrodesis status: Secondary | ICD-10-CM

## 2020-11-09 DIAGNOSIS — J90 Pleural effusion, not elsewhere classified: Secondary | ICD-10-CM

## 2020-11-09 DIAGNOSIS — F17210 Nicotine dependence, cigarettes, uncomplicated: Secondary | ICD-10-CM | POA: Diagnosis present

## 2020-11-09 DIAGNOSIS — R221 Localized swelling, mass and lump, neck: Secondary | ICD-10-CM

## 2020-11-09 DIAGNOSIS — C7951 Secondary malignant neoplasm of bone: Secondary | ICD-10-CM | POA: Diagnosis present

## 2020-11-09 DIAGNOSIS — Z79899 Other long term (current) drug therapy: Secondary | ICD-10-CM

## 2020-11-09 DIAGNOSIS — R918 Other nonspecific abnormal finding of lung field: Secondary | ICD-10-CM | POA: Diagnosis present

## 2020-11-09 DIAGNOSIS — K869 Disease of pancreas, unspecified: Secondary | ICD-10-CM | POA: Diagnosis present

## 2020-11-09 DIAGNOSIS — I82290 Acute embolism and thrombosis of other thoracic veins: Secondary | ICD-10-CM | POA: Diagnosis present

## 2020-11-09 DIAGNOSIS — M899 Disorder of bone, unspecified: Secondary | ICD-10-CM | POA: Diagnosis present

## 2020-11-09 DIAGNOSIS — C3431 Malignant neoplasm of lower lobe, right bronchus or lung: Secondary | ICD-10-CM | POA: Diagnosis present

## 2020-11-09 DIAGNOSIS — R042 Hemoptysis: Secondary | ICD-10-CM | POA: Diagnosis present

## 2020-11-09 DIAGNOSIS — I480 Paroxysmal atrial fibrillation: Secondary | ICD-10-CM

## 2020-11-09 DIAGNOSIS — A419 Sepsis, unspecified organism: Principal | ICD-10-CM | POA: Diagnosis present

## 2020-11-09 DIAGNOSIS — D649 Anemia, unspecified: Secondary | ICD-10-CM | POA: Diagnosis present

## 2020-11-09 DIAGNOSIS — J439 Emphysema, unspecified: Secondary | ICD-10-CM | POA: Diagnosis present

## 2020-11-09 DIAGNOSIS — I4819 Other persistent atrial fibrillation: Secondary | ICD-10-CM | POA: Diagnosis not present

## 2020-11-09 DIAGNOSIS — Z20822 Contact with and (suspected) exposure to covid-19: Secondary | ICD-10-CM | POA: Diagnosis present

## 2020-11-09 DIAGNOSIS — R109 Unspecified abdominal pain: Secondary | ICD-10-CM

## 2020-11-09 DIAGNOSIS — E43 Unspecified severe protein-calorie malnutrition: Secondary | ICD-10-CM | POA: Diagnosis present

## 2020-11-09 DIAGNOSIS — G8929 Other chronic pain: Secondary | ICD-10-CM | POA: Diagnosis present

## 2020-11-09 DIAGNOSIS — R64 Cachexia: Secondary | ICD-10-CM | POA: Diagnosis present

## 2020-11-09 DIAGNOSIS — E876 Hypokalemia: Secondary | ICD-10-CM | POA: Diagnosis present

## 2020-11-09 DIAGNOSIS — I314 Cardiac tamponade: Secondary | ICD-10-CM | POA: Diagnosis present

## 2020-11-09 DIAGNOSIS — Z91013 Allergy to seafood: Secondary | ICD-10-CM

## 2020-11-09 DIAGNOSIS — R059 Cough, unspecified: Secondary | ICD-10-CM

## 2020-11-09 LAB — COMPREHENSIVE METABOLIC PANEL
ALT: 18 U/L (ref 0–44)
AST: 20 U/L (ref 15–41)
Albumin: 3.2 g/dL — ABNORMAL LOW (ref 3.5–5.0)
Alkaline Phosphatase: 93 U/L (ref 38–126)
Anion gap: 10 (ref 5–15)
BUN: 14 mg/dL (ref 6–20)
CO2: 21 mmol/L — ABNORMAL LOW (ref 22–32)
Calcium: 9 mg/dL (ref 8.9–10.3)
Chloride: 107 mmol/L (ref 98–111)
Creatinine, Ser: 0.79 mg/dL (ref 0.61–1.24)
GFR, Estimated: >60 mL/min (ref >60)
Glucose, Bld: 122 mg/dL — ABNORMAL HIGH (ref 70–99)
Potassium: 3.6 mmol/L (ref 3.5–5.1)
Sodium: 138 mmol/L (ref 135–145)
Total Bilirubin: 0.8 mg/dL (ref 0.3–1.2)
Total Protein: 6.1 g/dL — ABNORMAL LOW (ref 6.5–8.1)

## 2020-11-09 LAB — CBC WITH DIFFERENTIAL/PLATELET
Abs Immature Granulocytes: 0.03 10*3/uL (ref 0.00–0.07)
Basophils Absolute: 0 10*3/uL (ref 0.0–0.1)
Basophils Relative: 0 %
Eosinophils Absolute: 0.2 10*3/uL (ref 0.0–0.5)
Eosinophils Relative: 3 %
HCT: 35.3 % — ABNORMAL LOW (ref 39.0–52.0)
Hemoglobin: 12.2 g/dL — ABNORMAL LOW (ref 13.0–17.0)
Immature Granulocytes: 0 %
Lymphocytes Relative: 13 %
Lymphs Abs: 1.1 10*3/uL (ref 0.7–4.0)
MCH: 33.2 pg (ref 26.0–34.0)
MCHC: 34.6 g/dL (ref 30.0–36.0)
MCV: 95.9 fL (ref 80.0–100.0)
Monocytes Absolute: 0.9 10*3/uL (ref 0.1–1.0)
Monocytes Relative: 11 %
Neutro Abs: 5.9 10*3/uL (ref 1.7–7.7)
Neutrophils Relative %: 73 %
Platelets: 288 10*3/uL (ref 150–400)
RBC: 3.68 MIL/uL — ABNORMAL LOW (ref 4.22–5.81)
RDW: 13.4 % (ref 11.5–15.5)
WBC: 8.1 10*3/uL (ref 4.0–10.5)
nRBC: 0 % (ref 0.0–0.2)

## 2020-11-09 NOTE — ED Provider Notes (Signed)
[facility] EMERGENCY DEPARTMENT Provider Note   CSN: [id number] Arrival date & time: 11/09/20  1832     History Chief Complaint  Patient presents with  . Back Pain  . Cough    Brian Leonard is a 60 y.o. male with past medical history of asthma, ongoing 20-pack-year smoking history, BPPV, and degenerative disc disease who presents the ED with complaints of subacute/chronic cough productive of whitish sputum.  Patient states that he lost his medical insurance and has not seen a primary care provider in nearly 10 years.  He states that he did have a colonoscopy at one point when he was younger.  When asked what prompted patient to come into the ED today, he states that it is because in addition to his subacute/chronic mildly productive cough that he described as being comparable to his typical smoker's cough, he has also been experiencing multiple areas of joint discomfort, most notably involving shoulders bilaterally.  He also notes a mildly pruritic rash over his right upper arm x2 months.  He states that he has been applying alcohol to the area, with little relief.  Patient denies any chest pain or shortness of breath, hemoptysis, history of clots or clotting disorder, unilateral extremity swelling or edema, recent weight loss, fevers, chills, night sweats, abdominal pain, or any other symptoms.  Patient only smokes half pack per day, but smokes marijuana on a daily basis as well.  He denies any IVDA or other illicit drugs.  HPI     Past Medical History:  Diagnosis Date  . Asthma     There are no problems to display for this patient.   Past Surgical History:  Procedure Laterality Date  . CERVICAL SPINE SURGERY         No family history on file.  Social History   Tobacco Use  . Smoking status: Current Every Day Smoker  . Smokeless tobacco: Never Used  Substance Use Topics  . Alcohol use: Yes  . Drug use: Yes    Types: Marijuana    Home  Medications Prior to Admission medications   Medication Sig Start Date End Date Taking? Authorizing Provider  methocarbamol (ROBAXIN) 500 MG tablet Take 1 tablet (500 mg total) by mouth 2 (two) times daily. Patient not taking: Reported on 11/08/2015 07/06/15   Barrett, Lahoma Crocker, PA-C  Multiple Vitamin (MULTIVITAMIN WITH MINERALS) TABS tablet Take 1 tablet by mouth daily.    [provider]  naproxen (NAPROSYN) 500 MG tablet Take 1 tablet (500 mg total) by mouth 2 (two) times daily. Patient not taking: Reported on 11/08/2015 08/26/15   Patel-Mills, Orvil Feil, PA-C  ondansetron (ZOFRAN-ODT) 4 MG disintegrating tablet Take 1 tablet (4 mg total) by mouth every 8 (eight) hours as needed for nausea or vomiting. 11/08/15   Vira Blanco, MD  oxyCODONE-acetaminophen (PERCOCET/ROXICET) 5-325 MG tablet Take 2 tablets by mouth every 4 (four) hours as needed for severe pain. Patient not taking: Reported on 11/08/2015 08/26/15   Ottie Glazier, PA-C    Allergies    Iodine, Shellfish allergy, and Other  Review of Systems   Review of Systems  All other systems reviewed and are negative.   Physical Exam Updated Vital Signs BP (!) 127/98 (BP Location: Left Arm)   Pulse (!) 114   Temp 98.4 F (36.9 C) (Oral)   Resp (!) 22   SpO2 99%   Physical Exam Vitals and nursing note reviewed. Exam conducted with a chaperone present.  Constitutional:  General: He is not in acute distress.    Appearance: Normal appearance. He is not toxic-appearing.  HENT:     Head: Normocephalic and atraumatic.     Mouth/Throat:     Comments: Patent oropharynx.  No trismus.  Discolored tongue.  No teeth.  1 x 1 cm irregular nontender lesion on left lower gum line. Eyes:     General: No scleral icterus.    Conjunctiva/sclera: Conjunctivae normal.  Cardiovascular:     Rate and Rhythm: Regular rhythm. Tachycardia present.     Pulses: Normal pulses.  Pulmonary:     Effort: Pulmonary effort is normal. No respiratory  distress.     Breath sounds: Normal breath sounds. No stridor. No wheezing or rales.     Comments: CTA bilaterally.  No increased work of breathing.  99% on room air.  Symmetric chest rise. Skin:    General: Skin is dry.     Findings: Rash present.     Comments: Scaly macular rash involving right upper arm.  Nontender.  Negative Nikolsky sign.  Neurological:     Mental Status: He is alert.     GCS: GCS eye subscore is 4. GCS verbal subscore is 5. GCS motor subscore is 6.  Psychiatric:        Mood and Affect: Mood normal.        Behavior: Behavior normal.        Thought Content: Thought content normal.            ED Results / Procedures / Treatments   Labs (all labs ordered are listed, but only abnormal results are displayed) Labs Reviewed  CBC WITH DIFFERENTIAL/PLATELET - Abnormal; Notable for the following components:      Result Value   RBC 3.68 (*)    Hemoglobin 12.2 (*)    HCT 35.3 (*)    All other components within normal limits  COMPREHENSIVE METABOLIC PANEL - Abnormal; Notable for the following components:   CO2 21 (*)    Glucose, Bld 122 (*)    Total Protein 6.1 (*)    Albumin 3.2 (*)    All other components within normal limits    EKG None  Radiology DG Chest 2 View  Result Date: 11/09/2020 CLINICAL DATA:  Back pain and productive cough. EXAM: CHEST - 2 VIEW COMPARISON:  None. FINDINGS: Lungs are hyperexpanded. Soft tissue fullness noted in the right hilum. Left lung unremarkable. No pleural effusion. The visualized bony structures of the thorax show no acute abnormality. IMPRESSION: Soft tissue fullness in the right hilum. CT chest with contrast recommended to further evaluate as lymphadenopathy/neoplasm a concern. Electronically Signed   By: Misty Stanley M.D.   On: 11/09/2020 20:21    Procedures Procedures   Medications Ordered in ED Medications - No data to display  ED Course  I have reviewed the triage vital signs and the nursing  notes.  Pertinent labs & imaging results that were available during my care of the patient were reviewed by me and considered in my medical decision making (see chart for details).    MDM Rules/Calculators/A&P                          Durell Lofaso was evaluated in Emergency Department on 11/09/2020 for the symptoms described in the history of present illness. He was evaluated in the context of the global COVID-19 pandemic, which necessitated consideration that the patient might be at risk for infection with  the SARS-CoV-2 virus that causes COVID-19. Institutional protocols and algorithms that pertain to the evaluation of patients at risk for COVID-19 are in a state of rapid change based on information released by regulatory bodies including the CDC and federal and state organizations. These policies and algorithms were followed during the patient's care in the ED.  I personally reviewed patient's medical chart and all notes from triage and staff during today's encounter. I have also ordered and reviewed all labs and imaging that I felt to be medically necessary in the evaluation of this patient's complaints and with consideration of their physical exam. If needed, translation services were available and utilized.   Patient does not have any outpatient follow-up or medical insurance at this time.  He is in here for bilateral shoulder discomfort/body aches over the course of the past month.  He states that he will worsening body aches including his chest and abdomen when he coughs, but this discomfort is not persistent or exertional.  He denies any shortness of breath.  Patient states that he smokes tobacco and marijuana regularly.    Plain films of chest demonstrate hyperexpanded lungs suggestive of COPD.  There is also evidence of soft tissue fullness in the right hilum concerning for malignancy versus lymphadenopathy.  Recommending CT with contrast for further evaluation.  While patient is  tachycardic here in the ED, he is not toxic appearing and does not appear to be in any acute distress.  Patient is afebrile denies any recent fevers, chills, or other symptoms concerning for infection.  He denies any chest pain or shortness of breath.  No hemoptysis or history of clots.  No unilateral extremity swelling or edema.  While he endorsed upper and lower back discomfort in triage, he reports that this is consistent with his history of degenerative disc disease and had ACDF years ago.  If anything, his new body aches that are perhaps concerning are his bilateral shoulders.  He denies any history of IVDA or penile discharge concerning for disseminated gonorrhea.  At shift change care was transferred to [facility], PA-C who will follow pending studies, re-evaluate, and determine disposition.    At the very least, patient will need referral to ENT given his unusual oral lesion.    The patient was counseled on the dangers of tobacco use, and was advised to quit.  Reviewed strategies to maximize success, including removing cigarettes and smoking materials from environment, stress management, substitution of other forms of reinforcement, support of family/friends and written materials. Total time was 5 min CPT code 99406.    Final Clinical Impression(s) / ED Diagnoses Final diagnoses:  Cough    Rx / DC Orders ED Discharge Orders    None       Reita Chard 11/09/20 2359    Luna Fuse, MD 11/11/20 1319

## 2020-11-09 NOTE — ED Triage Notes (Signed)
Pt reports that he has been having back pain and productive cough for the past 2 week, white sputum, pain is in upper and lower back, worse when coughing. Has DDD, denies injury, denies fevers.

## 2020-11-09 NOTE — ED Provider Notes (Signed)
60 year old male received a signout from [facility] pending CT chest.  Per his HPI:  "Brian Leonard is a 60 y.o. male with past medical history of asthma, ongoing 20-pack-year smoking history, BPPV, and degenerative disc disease who presents the ED with complaints of subacute/chronic cough productive of whitish sputum.  Patient states that he lost his medical insurance and has not seen a primary care provider in nearly 10 years.  He states that he did have a colonoscopy at one point when he was younger.  When asked what prompted patient to come into the ED today, he states that it is because in addition to his subacute/chronic mildly productive cough that he described as being comparable to his typical smoker's cough, he has also been experiencing multiple areas of joint discomfort, most notably involving shoulders bilaterally.  He also notes a mildly pruritic rash over his right upper arm x2 months.  He states that he has been applying alcohol to the area, with little relief.  Patient denies any chest pain or shortness of breath, hemoptysis, history of clots or clotting disorder, unilateral extremity swelling or edema, recent weight loss, fevers, chills, night sweats, abdominal pain, or any other symptoms.  Patient only smokes half pack per day, but smokes marijuana on a daily basis as well.  He denies any IVDA or other illicit drugs."  On my evaluation, the patient endorses a 1 month history of bilateral upper back and chest pain that has been constant and gradually worsening since onset.  He also endorses a cough that is mildly productive that is worse than his baseline cough.  He is also endorsing joint pain in his bilateral shoulders and elbows.  He came in for evaluation to the emergency department today as he was concerned that he may have COVID-19.  No night sweats, unintentional weight loss, fever, chills.    No history of TB.  No history of TB exposures.  He is a current, every day smoker.   He also endorses marijuana use, but no other illicit or recreational substance use. He denies IV drug use.   Physical Exam  BP 121/88 (BP Location: Left Arm)    Pulse (!) 113    Temp 98.4 F (36.9 C) (Oral)    Resp 20    SpO2 97%   Physical Exam Constitutional:      General: He is not in acute distress.    Appearance: He is well-developed and well-nourished. He is not ill-appearing, toxic-appearing or diaphoretic.     Comments: Thin male.  No acute distress.  HENT:     Head: Normocephalic and atraumatic.  Cardiovascular:     Rate and Rhythm: Tachycardia present.     Pulses: Normal pulses.     Heart sounds: Normal heart sounds. No murmur heard. No friction rub. No gallop.   Pulmonary:     Effort: Pulmonary effort is normal.     Comments: No increased work of breathing.  Patient is able to speak in complete, fluent sentences.  Lungs are clear to auscultation bilaterally. Abdominal:     General: There is no distension.  Musculoskeletal:     Cervical back: Neck supple.  Neurological:     Mental Status: He is alert and oriented to person, place, and time.     Cranial Nerves: No cranial nerve deficit.  Psychiatric:        Mood and Affect: Mood and affect normal.        Behavior: Behavior normal.  ED Course/Procedures     .Critical Care Performed by: Joanne Gavel, PA-C Authorized by: Joanne Gavel, PA-C   Critical care provider statement:    Critical care time (minutes):  40   Critical care time was exclusive of:  Separately billable procedures and treating other patients and teaching time   Critical care was necessary to treat or prevent imminent or life-threatening deterioration of the following conditions:  Circulatory failure   Critical care was time spent personally by me on the following activities:  Ordering and review of laboratory studies, ordering and performing treatments and interventions, re-evaluation of patient's condition, review of old charts, pulse  oximetry, obtaining history from patient or surrogate, examination of patient, evaluation of patient's response to treatment and development of treatment plan with patient or surrogate   I assumed direction of critical care for this patient from another provider in my specialty: yes     Care discussed with: admitting provider      MDM   60 year old male received at signout from [facility] pending CT chest.  Please see his note for further work-up and medical decision making.  CT chest was ordered after chest x-ray demonstrated soft tissue fullness in the right hilum.    I personally spoke with radiology x2 regarding CT findings.  CT chest with multiple findings including: - Extensive centrally necrotic lymphadenopathy with numerous scattered solid micronodules to the bilateral lungs.  Differential diagnosis includes primary pulmonary malignancy versus metastatic disease from a partially visualized pancreatic tail lesion versus atypical infection such as mycobacterial or tuberculosis. - Moderate pericardial effusion, likely malignant. -Thrombus in the left brachiocephalic vein -Trace right pleural effusion -A hypoattenuating nodule in the thyroid isthmus.  Thyroid ultrasound recommended. -And an indeterminate hypoattenuating focus in the posterior right lobe of the liver  Patient is tachycardic, but vital signs are otherwise stable.  No evidence of tamponade physiology.  Will start heparin for thrombus.  The patient will need an echo for pericardial effusion.  Since he is actively having chest pain we will add on troponin.  I do have a low suspicion for ACS at this time given findings on CT chest.  Fentanyl and Zofran have been ordered for pain and nausea control.  We will also add on QuantiFERON and COVID-19 test.  I personally discussed to the CT results with the patient at bedside extensively for more than 20 minutes as patient initially wanted to leave AMA.  All questions were answered.  I  offered to call family or a chaplain as patient acknowledged that he was very worried about the results, but he declined.  Consult to the hospitalist and Dr. Myna Hidalgo will accept the patient for admission. The patient appears reasonably stabilized for admission considering the current resources, flow, and capabilities available in the ED at this time, and I doubt any other [facility] requiring further screening and/or treatment in the ED prior to admission.       Joanne Gavel, PA-C 11/10/20 6789    Fatima Blank, MD 11/10/20 [phone]

## 2020-11-10 ENCOUNTER — Inpatient Hospital Stay (HOSPITAL_COMMUNITY): Payer: Medicaid Other

## 2020-11-10 ENCOUNTER — Emergency Department (HOSPITAL_COMMUNITY): Payer: Medicaid Other

## 2020-11-10 ENCOUNTER — Encounter (HOSPITAL_COMMUNITY)
Admission: EM | Disposition: A | Discharge status: Home / Self Care | Payer: Self-pay | Source: Home / Self Care | Attending: Internal Medicine

## 2020-11-10 ENCOUNTER — Inpatient Hospital Stay (HOSPITAL_COMMUNITY): Payer: Medicaid Other | Admitting: Anesthesiology

## 2020-11-10 ENCOUNTER — Encounter (HOSPITAL_COMMUNITY): Payer: Self-pay | Admitting: Family Medicine

## 2020-11-10 DIAGNOSIS — K769 Liver disease, unspecified: Secondary | ICD-10-CM

## 2020-11-10 DIAGNOSIS — A419 Sepsis, unspecified organism: Secondary | ICD-10-CM | POA: Diagnosis present

## 2020-11-10 DIAGNOSIS — J439 Emphysema, unspecified: Secondary | ICD-10-CM | POA: Diagnosis present

## 2020-11-10 DIAGNOSIS — I314 Cardiac tamponade: Secondary | ICD-10-CM

## 2020-11-10 DIAGNOSIS — Z91041 Radiographic dye allergy status: Secondary | ICD-10-CM | POA: Diagnosis not present

## 2020-11-10 DIAGNOSIS — M899 Disorder of bone, unspecified: Secondary | ICD-10-CM | POA: Diagnosis present

## 2020-11-10 DIAGNOSIS — I472 Ventricular tachycardia: Secondary | ICD-10-CM | POA: Diagnosis not present

## 2020-11-10 DIAGNOSIS — F17210 Nicotine dependence, cigarettes, uncomplicated: Secondary | ICD-10-CM | POA: Diagnosis present

## 2020-11-10 DIAGNOSIS — D649 Anemia, unspecified: Secondary | ICD-10-CM | POA: Diagnosis present

## 2020-11-10 DIAGNOSIS — I313 Pericardial effusion (noninflammatory): Principal | ICD-10-CM | POA: Diagnosis present

## 2020-11-10 DIAGNOSIS — C7951 Secondary malignant neoplasm of bone: Secondary | ICD-10-CM | POA: Diagnosis present

## 2020-11-10 DIAGNOSIS — I82290 Acute embolism and thrombosis of other thoracic veins: Secondary | ICD-10-CM | POA: Diagnosis present

## 2020-11-10 DIAGNOSIS — E871 Hypo-osmolality and hyponatremia: Secondary | ICD-10-CM | POA: Diagnosis present

## 2020-11-10 DIAGNOSIS — R042 Hemoptysis: Secondary | ICD-10-CM | POA: Diagnosis present

## 2020-11-10 DIAGNOSIS — Z681 Body mass index (BMI) 19 or less, adult: Secondary | ICD-10-CM | POA: Diagnosis not present

## 2020-11-10 DIAGNOSIS — Z91013 Allergy to seafood: Secondary | ICD-10-CM | POA: Diagnosis not present

## 2020-11-10 DIAGNOSIS — I309 Acute pericarditis, unspecified: Secondary | ICD-10-CM | POA: Diagnosis present

## 2020-11-10 DIAGNOSIS — I3139 Other pericardial effusion (noninflammatory): Secondary | ICD-10-CM | POA: Diagnosis present

## 2020-11-10 DIAGNOSIS — R590 Localized enlarged lymph nodes: Secondary | ICD-10-CM

## 2020-11-10 DIAGNOSIS — Z79899 Other long term (current) drug therapy: Secondary | ICD-10-CM | POA: Diagnosis not present

## 2020-11-10 DIAGNOSIS — Z20822 Contact with and (suspected) exposure to covid-19: Secondary | ICD-10-CM | POA: Diagnosis present

## 2020-11-10 DIAGNOSIS — G8929 Other chronic pain: Secondary | ICD-10-CM | POA: Diagnosis present

## 2020-11-10 DIAGNOSIS — I4819 Other persistent atrial fibrillation: Secondary | ICD-10-CM | POA: Diagnosis not present

## 2020-11-10 DIAGNOSIS — Z981 Arthrodesis status: Secondary | ICD-10-CM | POA: Diagnosis not present

## 2020-11-10 DIAGNOSIS — R918 Other nonspecific abnormal finding of lung field: Secondary | ICD-10-CM

## 2020-11-10 DIAGNOSIS — E041 Nontoxic single thyroid nodule: Secondary | ICD-10-CM | POA: Diagnosis present

## 2020-11-10 DIAGNOSIS — C3431 Malignant neoplasm of lower lobe, right bronchus or lung: Secondary | ICD-10-CM | POA: Diagnosis present

## 2020-11-10 DIAGNOSIS — E43 Unspecified severe protein-calorie malnutrition: Secondary | ICD-10-CM | POA: Diagnosis present

## 2020-11-10 DIAGNOSIS — R64 Cachexia: Secondary | ICD-10-CM | POA: Diagnosis present

## 2020-11-10 DIAGNOSIS — K869 Disease of pancreas, unspecified: Secondary | ICD-10-CM | POA: Diagnosis present

## 2020-11-10 DIAGNOSIS — J90 Pleural effusion, not elsewhere classified: Secondary | ICD-10-CM | POA: Diagnosis present

## 2020-11-10 HISTORY — PX: PERICARDIAL WINDOW: SHX2213

## 2020-11-10 LAB — POCT I-STAT 7, (LYTES, BLD GAS, ICA,H+H)
Acid-base deficit: 5 mmol/L — ABNORMAL HIGH (ref 0.0–2.0)
Bicarbonate: 22.1 mmol/L (ref 20.0–28.0)
Calcium, Ion: 1.28 mmol/L (ref 1.15–1.40)
HCT: 25 % — ABNORMAL LOW (ref 39.0–52.0)
Hemoglobin: 8.5 g/dL — ABNORMAL LOW (ref 13.0–17.0)
O2 Saturation: 100 %
Patient temperature: 35.7
Potassium: 4 mmol/L (ref 3.5–5.1)
Sodium: 136 mmol/L (ref 135–145)
TCO2: 24 mmol/L (ref 22–32)
pCO2 arterial: 50.5 mmHg — ABNORMAL HIGH (ref 32.0–48.0)
pH, Arterial: 7.242 — ABNORMAL LOW (ref 7.350–7.450)
pO2, Arterial: 438 mmHg — ABNORMAL HIGH (ref 83.0–108.0)

## 2020-11-10 LAB — ECHO INTRAOPERATIVE TEE
Height: 71 in
Weight: 2176 oz

## 2020-11-10 LAB — BASIC METABOLIC PANEL
Anion gap: 10 (ref 5–15)
BUN: 13 mg/dL (ref 6–20)
CO2: 21 mmol/L — ABNORMAL LOW (ref 22–32)
Calcium: 8.9 mg/dL (ref 8.9–10.3)
Chloride: 106 mmol/L (ref 98–111)
Creatinine, Ser: 0.8 mg/dL (ref 0.61–1.24)
GFR, Estimated: >60 mL/min (ref >60)
Glucose, Bld: 174 mg/dL — ABNORMAL HIGH (ref 70–99)
Potassium: 3.5 mmol/L (ref 3.5–5.1)
Sodium: 137 mmol/L (ref 135–145)

## 2020-11-10 LAB — CBC
HCT: 35.9 % — ABNORMAL LOW (ref 39.0–52.0)
Hemoglobin: 11.7 g/dL — ABNORMAL LOW (ref 13.0–17.0)
MCH: 32.2 pg (ref 26.0–34.0)
MCHC: 32.6 g/dL (ref 30.0–36.0)
MCV: 98.9 fL (ref 80.0–100.0)
Platelets: 268 10*3/uL (ref 150–400)
RBC: 3.63 MIL/uL — ABNORMAL LOW (ref 4.22–5.81)
RDW: 13.4 % (ref 11.5–15.5)
WBC: 6.7 10*3/uL (ref 4.0–10.5)
nRBC: 0 % (ref 0.0–0.2)

## 2020-11-10 LAB — RESP PANEL BY RT-PCR (FLU A&B, COVID) ARPGX2
Influenza A by PCR: NEGATIVE
Influenza B by PCR: NEGATIVE
SARS Coronavirus 2 by RT PCR: NEGATIVE

## 2020-11-10 LAB — LIPASE, BLOOD: Lipase: 68 U/L — ABNORMAL HIGH (ref 11–51)

## 2020-11-10 LAB — GLUCOSE, CAPILLARY: Glucose-Capillary: 143 mg/dL — ABNORMAL HIGH (ref 70–99)

## 2020-11-10 LAB — TYPE AND SCREEN
ABO/RH(D): B POS
Antibody Screen: NEGATIVE

## 2020-11-10 LAB — ECHOCARDIOGRAM COMPLETE
Height: 71 in
S' Lateral: 2.15 cm
Weight: 2176 oz

## 2020-11-10 LAB — HEPARIN LEVEL (UNFRACTIONATED): Heparin Unfractionated: 0.18 IU/mL — ABNORMAL LOW (ref 0.30–0.70)

## 2020-11-10 LAB — TROPONIN I (HIGH SENSITIVITY)
Troponin I (High Sensitivity): 16 ng/L (ref <18)
Troponin I (High Sensitivity): 16 ng/L (ref <18)

## 2020-11-10 LAB — ABO/RH: ABO/RH(D): B POS

## 2020-11-10 SURGERY — CREATION, PERICARDIAL WINDOW
Anesthesia: General

## 2020-11-10 MED ORDER — CHLORHEXIDINE GLUCONATE 0.12 % MT SOLN
OROMUCOSAL | Status: AC
  Filled 2020-11-10: qty 15

## 2020-11-10 MED ORDER — MIDAZOLAM HCL 5 MG/5ML IJ SOLN
INTRAMUSCULAR | Status: DC | PRN
  Administered 2020-11-10: 2 mg via INTRAVENOUS

## 2020-11-10 MED ORDER — SENNOSIDES-DOCUSATE SODIUM 8.6-50 MG PO TABS: 1.0000 | ORAL_TABLET | Freq: Every evening | ORAL | Status: DC | PRN

## 2020-11-10 MED ORDER — ACETAMINOPHEN 650 MG RE SUPP: 650.0000 mg | Freq: Four times a day (QID) | RECTAL | Status: DC | PRN

## 2020-11-10 MED ORDER — NICOTINE 14 MG/24HR TD PT24
14.0000 mg | MEDICATED_PATCH | Freq: Every day | TRANSDERMAL | Status: DC
  Administered 2020-11-11 – 2020-11-19 (×9): 14 mg via TRANSDERMAL
  Filled 2020-11-10 (×9): qty 1

## 2020-11-10 MED ORDER — LIDOCAINE 2% (20 MG/ML) 5 ML SYRINGE
INTRAMUSCULAR | Status: DC | PRN
  Administered 2020-11-10: 60 mg via INTRAVENOUS

## 2020-11-10 MED ORDER — 0.9 % SODIUM CHLORIDE (POUR BTL) OPTIME
TOPICAL | Status: DC | PRN
  Administered 2020-11-10: 2000 mL

## 2020-11-10 MED ORDER — FENTANYL CITRATE (PF) 250 MCG/5ML IJ SOLN
INTRAMUSCULAR | Status: AC
  Filled 2020-11-10: qty 5

## 2020-11-10 MED ORDER — GUAIFENESIN 100 MG/5ML PO SOLN
5.0000 mL | Freq: Once | ORAL | Status: AC
  Administered 2020-11-10: 100 mg via ORAL
  Filled 2020-11-10: qty 5

## 2020-11-10 MED ORDER — SUCCINYLCHOLINE CHLORIDE 200 MG/10ML IV SOSY
PREFILLED_SYRINGE | INTRAVENOUS | Status: DC | PRN
  Administered 2020-11-10: 60 mg via INTRAVENOUS

## 2020-11-10 MED ORDER — IOHEXOL 300 MG/ML  SOLN
75.0000 mL | Freq: Once | INTRAMUSCULAR | Status: AC | PRN
  Administered 2020-11-10: 75 mL via INTRAVENOUS

## 2020-11-10 MED ORDER — KETAMINE HCL 50 MG/5ML IJ SOSY
PREFILLED_SYRINGE | INTRAMUSCULAR | Status: AC
  Filled 2020-11-10: qty 5

## 2020-11-10 MED ORDER — IOHEXOL 9 MG/ML PO SOLN
ORAL | Status: AC
  Filled 2020-11-10: qty 1000

## 2020-11-10 MED ORDER — ROCURONIUM BROMIDE 10 MG/ML (PF) SYRINGE
PREFILLED_SYRINGE | INTRAVENOUS | Status: DC | PRN
  Administered 2020-11-10 (×2): 20 mg via INTRAVENOUS

## 2020-11-10 MED ORDER — CEFAZOLIN SODIUM-DEXTROSE 2-4 GM/100ML-% IV SOLN
2.0000 g | Freq: Once | INTRAVENOUS | Status: AC
  Administered 2020-11-10: 2 g via INTRAVENOUS

## 2020-11-10 MED ORDER — KETAMINE HCL 10 MG/ML IJ SOLN
INTRAMUSCULAR | Status: DC | PRN
  Administered 2020-11-10: 30 mg via INTRAVENOUS

## 2020-11-10 MED ORDER — FENTANYL CITRATE (PF) 250 MCG/5ML IJ SOLN
INTRAMUSCULAR | Status: DC | PRN
  Administered 2020-11-10 (×5): 50 ug via INTRAVENOUS

## 2020-11-10 MED ORDER — HYDROCODONE-ACETAMINOPHEN 5-325 MG PO TABS
1.0000 | ORAL_TABLET | ORAL | Status: DC | PRN
  Administered 2020-11-10 (×2): 2 via ORAL
  Administered 2020-11-10: 08:00:00 1 via ORAL
  Administered 2020-11-11 – 2020-11-12 (×4): 2 via ORAL
  Filled 2020-11-10 (×4): qty 2
  Filled 2020-11-10: qty 1
  Filled 2020-11-10 (×2): qty 2

## 2020-11-10 MED ORDER — INSULIN ASPART 100 UNIT/ML ~~LOC~~ SOLN
0.0000 [IU] | Freq: Three times a day (TID) | SUBCUTANEOUS | Status: DC
  Administered 2020-11-14 – 2020-11-18 (×4): 1 [IU] via SUBCUTANEOUS

## 2020-11-10 MED ORDER — PROMETHAZINE HCL 25 MG/ML IJ SOLN: 6.2500 mg | INTRAMUSCULAR | Status: DC | PRN

## 2020-11-10 MED ORDER — CHLORHEXIDINE GLUCONATE 0.12 % MT SOLN: 15.0000 mL | Freq: Once | OROMUCOSAL | Status: DC

## 2020-11-10 MED ORDER — BISACODYL 5 MG PO TBEC: 5.0000 mg | DELAYED_RELEASE_TABLET | Freq: Every day | ORAL | Status: DC | PRN

## 2020-11-10 MED ORDER — ONDANSETRON HCL 4 MG/2ML IJ SOLN
4.0000 mg | Freq: Four times a day (QID) | INTRAMUSCULAR | Status: DC | PRN
  Administered 2020-11-10: 13:00:00 4 mg via INTRAVENOUS
  Filled 2020-11-10: qty 2

## 2020-11-10 MED ORDER — ACETAMINOPHEN 10 MG/ML IV SOLN
INTRAVENOUS | Status: AC
  Filled 2020-11-10: qty 100

## 2020-11-10 MED ORDER — ESMOLOL HCL 100 MG/10ML IV SOLN
INTRAVENOUS | Status: DC | PRN
  Administered 2020-11-10: 20 mg via INTRAVENOUS

## 2020-11-10 MED ORDER — ONDANSETRON HCL 4 MG/2ML IJ SOLN
INTRAMUSCULAR | Status: DC | PRN
  Administered 2020-11-10: 4 mg via INTRAVENOUS

## 2020-11-10 MED ORDER — METOPROLOL TARTRATE 5 MG/5ML IV SOLN
2.5000 mg | Freq: Four times a day (QID) | INTRAVENOUS | Status: DC | PRN
  Administered 2020-11-10: 2.5 mg via INTRAVENOUS
  Filled 2020-11-10: qty 5

## 2020-11-10 MED ORDER — ALBUMIN HUMAN 5 % IV SOLN: INTRAVENOUS | Status: DC | PRN

## 2020-11-10 MED ORDER — SODIUM CHLORIDE 0.9 % IV SOLN: INTRAVENOUS | Status: DC

## 2020-11-10 MED ORDER — LACTATED RINGERS IV SOLN: INTRAVENOUS | Status: DC | PRN

## 2020-11-10 MED ORDER — ETOMIDATE 2 MG/ML IV SOLN
INTRAVENOUS | Status: DC | PRN
  Administered 2020-11-10: 10 mg via INTRAVENOUS

## 2020-11-10 MED ORDER — HYDROCODONE-ACETAMINOPHEN 7.5-325 MG/15ML PO SOLN
10.0000 mL | Freq: Once | ORAL | Status: AC
  Administered 2020-11-10: 10 mL via ORAL
  Filled 2020-11-10: qty 15

## 2020-11-10 MED ORDER — LEVALBUTEROL HCL 0.63 MG/3ML IN NEBU
0.6300 mg | INHALATION_SOLUTION | Freq: Four times a day (QID) | RESPIRATORY_TRACT | Status: DC
  Administered 2020-11-10: 0.63 mg via RESPIRATORY_TRACT
  Filled 2020-11-10 (×2): qty 3

## 2020-11-10 MED ORDER — DEXAMETHASONE SODIUM PHOSPHATE 10 MG/ML IJ SOLN
INTRAMUSCULAR | Status: DC | PRN
  Administered 2020-11-10: 10 mg via INTRAVENOUS

## 2020-11-10 MED ORDER — FENTANYL CITRATE (PF) 100 MCG/2ML IJ SOLN
INTRAMUSCULAR | Status: AC
  Filled 2020-11-10: qty 2

## 2020-11-10 MED ORDER — OXYCODONE HCL 5 MG/5ML PO SOLN: 5.0000 mg | Freq: Once | ORAL | Status: DC | PRN

## 2020-11-10 MED ORDER — OXYCODONE HCL 5 MG PO TABS: 5.0000 mg | ORAL_TABLET | Freq: Once | ORAL | Status: DC | PRN

## 2020-11-10 MED ORDER — SCOPOLAMINE 1 MG/3DAYS TD PT72
MEDICATED_PATCH | TRANSDERMAL | Status: DC | PRN
  Administered 2020-11-10: 1 via TRANSDERMAL

## 2020-11-10 MED ORDER — ACETAMINOPHEN 10 MG/ML IV SOLN
1000.0000 mg | Freq: Once | INTRAVENOUS | Status: DC | PRN
  Administered 2020-11-10: 1000 mg via INTRAVENOUS

## 2020-11-10 MED ORDER — MIDAZOLAM HCL 2 MG/2ML IJ SOLN
INTRAMUSCULAR | Status: AC
  Filled 2020-11-10: qty 2

## 2020-11-10 MED ORDER — FENTANYL CITRATE (PF) 100 MCG/2ML IJ SOLN
50.0000 ug | INTRAMUSCULAR | Status: DC | PRN
  Administered 2020-11-10: 50 ug via INTRAVENOUS
  Filled 2020-11-10: qty 2

## 2020-11-10 MED ORDER — ONDANSETRON HCL 4 MG PO TABS: 4.0000 mg | ORAL_TABLET | Freq: Four times a day (QID) | ORAL | Status: DC | PRN

## 2020-11-10 MED ORDER — OXYMETAZOLINE HCL 0.05 % NA SOLN
NASAL | Status: DC | PRN
  Administered 2020-11-10: 3 via NASAL

## 2020-11-10 MED ORDER — VASOPRESSIN 20 UNIT/ML IV SOLN
INTRAVENOUS | Status: AC
  Filled 2020-11-10: qty 1

## 2020-11-10 MED ORDER — ACETAMINOPHEN 325 MG PO TABS: 650.0000 mg | ORAL_TABLET | Freq: Four times a day (QID) | ORAL | Status: DC | PRN

## 2020-11-10 MED ORDER — LACTATED RINGERS IV SOLN: INTRAVENOUS | Status: DC

## 2020-11-10 MED ORDER — IOHEXOL 300 MG/ML  SOLN
100.0000 mL | Freq: Once | INTRAMUSCULAR | Status: AC | PRN
  Administered 2020-11-10: 100 mL via INTRAVENOUS

## 2020-11-10 MED ORDER — SODIUM CHLORIDE 0.9 % IV BOLUS
1000.0000 mL | Freq: Once | INTRAVENOUS | Status: AC
  Administered 2020-11-10: 1000 mL via INTRAVENOUS

## 2020-11-10 MED ORDER — ESMOLOL HCL 100 MG/10ML IV SOLN
INTRAVENOUS | Status: AC
  Filled 2020-11-10: qty 10

## 2020-11-10 MED ORDER — PROPOFOL 10 MG/ML IV BOLUS
INTRAVENOUS | Status: AC
  Filled 2020-11-10: qty 20

## 2020-11-10 MED ORDER — SODIUM CHLORIDE 0.9 % IV SOLN: INTRAVENOUS | Status: AC

## 2020-11-10 MED ORDER — FENTANYL CITRATE (PF) 100 MCG/2ML IJ SOLN
25.0000 ug | INTRAMUSCULAR | Status: DC | PRN
  Administered 2020-11-10 – 2020-11-11 (×4): 25 ug via INTRAVENOUS

## 2020-11-10 MED ORDER — ONDANSETRON HCL 4 MG/2ML IJ SOLN
4.0000 mg | Freq: Once | INTRAMUSCULAR | Status: AC | PRN
  Administered 2020-11-10: 4 mg via INTRAVENOUS
  Filled 2020-11-10: qty 2

## 2020-11-10 MED ORDER — SUGAMMADEX SODIUM 200 MG/2ML IV SOLN
INTRAVENOUS | Status: DC | PRN
  Administered 2020-11-10: 200 mg via INTRAVENOUS

## 2020-11-10 MED ORDER — FENTANYL CITRATE (PF) 100 MCG/2ML IJ SOLN
50.0000 ug | INTRAMUSCULAR | Status: DC | PRN
  Administered 2020-11-10 – 2020-11-11 (×2): 50 ug via INTRAVENOUS
  Filled 2020-11-10 (×2): qty 2

## 2020-11-10 MED ORDER — OXYMETAZOLINE HCL 0.05 % NA SOLN
NASAL | Status: AC
  Filled 2020-11-10: qty 30

## 2020-11-10 MED ORDER — AMISULPRIDE (ANTIEMETIC) 5 MG/2ML IV SOLN: 10.0000 mg | Freq: Once | INTRAVENOUS | Status: DC | PRN

## 2020-11-10 MED ORDER — ACETAMINOPHEN 325 MG PO TABS
650.0000 mg | ORAL_TABLET | Freq: Once | ORAL | Status: AC
  Administered 2020-11-10: 650 mg via ORAL
  Filled 2020-11-10: qty 2

## 2020-11-10 MED ORDER — HEPARIN (PORCINE) 25000 UT/250ML-% IV SOLN
1200.0000 [IU]/h | INTRAVENOUS | Status: DC
  Administered 2020-11-10: 1000 [IU]/h via INTRAVENOUS
  Filled 2020-11-10 (×2): qty 250

## 2020-11-10 MED ORDER — CEFAZOLIN SODIUM-DEXTROSE 2-4 GM/100ML-% IV SOLN
INTRAVENOUS | Status: AC
  Filled 2020-11-10: qty 100

## 2020-11-10 MED ORDER — GUAIFENESIN 100 MG/5ML PO SOLN
15.0000 mL | ORAL | Status: DC | PRN
  Administered 2020-11-10: 300 mg via ORAL
  Filled 2020-11-10 (×2): qty 15

## 2020-11-10 MED ORDER — HEPARIN BOLUS VIA INFUSION
2000.0000 [IU] | Freq: Once | INTRAVENOUS | Status: AC
  Administered 2020-11-10: 2000 [IU] via INTRAVENOUS
  Filled 2020-11-10: qty 2000

## 2020-11-10 MED ORDER — HEPARIN BOLUS VIA INFUSION
4000.0000 [IU] | Freq: Once | INTRAVENOUS | Status: AC
  Administered 2020-11-10: 4000 [IU] via INTRAVENOUS
  Filled 2020-11-10: qty 4000

## 2020-11-10 SURGICAL SUPPLY — 48 items
ADH SKN CLS LQ APL DERMABOND (GAUZE/BANDAGES/DRESSINGS) ×1
ATTRACTOMAT 16X20 MAGNETIC DRP (DRAPES) ×2 IMPLANT
BLADE CLIPPER SURG (BLADE) ×2 IMPLANT
CANISTER SUCT 3000ML PPV (MISCELLANEOUS) ×2 IMPLANT
CATH THORACIC 28FR (CATHETERS) IMPLANT
CATH THORACIC 28FR RT ANG (CATHETERS) IMPLANT
CATH THORACIC 36FR (CATHETERS) IMPLANT
CATH THORACIC 36FR RT ANG (CATHETERS) IMPLANT
CNTNR URN SCR LID CUP LEK RST (MISCELLANEOUS) ×1 IMPLANT
CONT SPEC 4OZ STRL OR WHT (MISCELLANEOUS) ×2
COVER SURGICAL LIGHT HANDLE (MISCELLANEOUS) ×4 IMPLANT
DERMABOND ADHESIVE PROPEN (GAUZE/BANDAGES/DRESSINGS) ×1
DERMABOND ADVANCED .7 DNX6 (GAUZE/BANDAGES/DRESSINGS) ×1 IMPLANT
DRAIN CHANNEL 28F RND 3/8 FF (WOUND CARE) IMPLANT
DRAPE LAPAROSCOPIC ABDOMINAL (DRAPES) ×2 IMPLANT
ELECT BLADE 4.0 EZ CLEAN MEGAD (MISCELLANEOUS) ×2
ELECT REM PT RETURN 9FT ADLT (ELECTROSURGICAL) ×2
ELECTRODE BLDE 4.0 EZ CLN MEGD (MISCELLANEOUS) ×1 IMPLANT
ELECTRODE REM PT RTRN 9FT ADLT (ELECTROSURGICAL) ×1 IMPLANT
GAUZE SPONGE 4X4 12PLY STRL (GAUZE/BANDAGES/DRESSINGS) ×2 IMPLANT
GLOVE TRIUMPH SURG SIZE 7.0 (KITS) ×2 IMPLANT
GOWN STRL REUS W/ TWL XL LVL3 (GOWN DISPOSABLE) ×1 IMPLANT
GOWN STRL REUS W/TWL XL LVL3 (GOWN DISPOSABLE) ×2
HEMOSTAT POWDER SURGIFOAM 1G (HEMOSTASIS) IMPLANT
KIT BASIN OR (CUSTOM PROCEDURE TRAY) ×2 IMPLANT
KIT TURNOVER KIT B (KITS) ×2 IMPLANT
NS IRRIG 1000ML POUR BTL (IV SOLUTION) ×2 IMPLANT
PACK CHEST (CUSTOM PROCEDURE TRAY) ×2 IMPLANT
PAD ARMBOARD 7.5X6 YLW CONV (MISCELLANEOUS) ×4 IMPLANT
PAD ELECT DEFIB RADIOL ZOLL (MISCELLANEOUS) ×2 IMPLANT
SUT VIC AB 0 CT1 18XCR BRD 8 (SUTURE) ×1 IMPLANT
SUT VIC AB 0 CT1 8-18 (SUTURE) ×2
SUT VIC AB 1 CTX 18 (SUTURE) IMPLANT
SUT VIC AB 2-0 CT1 27 (SUTURE)
SUT VIC AB 2-0 CT1 TAPERPNT 27 (SUTURE) IMPLANT
SUT VIC AB 3-0 SH 27 (SUTURE) ×2
SUT VIC AB 3-0 SH 27X BRD (SUTURE) ×1 IMPLANT
SUT VIC AB 3-0 X1 27 (SUTURE) IMPLANT
SWAB COLLECTION DEVICE MRSA (MISCELLANEOUS) IMPLANT
SWAB CULTURE ESWAB REG 1ML (MISCELLANEOUS) IMPLANT
SYR 10ML LL (SYRINGE) IMPLANT
SYR 50ML SLIP (SYRINGE) IMPLANT
SYSTEM SAHARA CHEST DRAIN ATS (WOUND CARE) IMPLANT
TOWEL GREEN STERILE (TOWEL DISPOSABLE) ×2 IMPLANT
TOWEL GREEN STERILE FF (TOWEL DISPOSABLE) ×2 IMPLANT
TRAP SPECIMEN MUCUS 40CC (MISCELLANEOUS) ×2 IMPLANT
TRAY FOLEY MTR SLVR 14FR STAT (SET/KITS/TRAYS/PACK) ×2 IMPLANT
WATER STERILE IRR 1000ML POUR (IV SOLUTION) ×4 IMPLANT

## 2020-11-10 NOTE — ED Notes (Signed)
Lunch Tray Ordered @ 1015. 

## 2020-11-10 NOTE — Anesthesia Postprocedure Evaluation (Signed)
Anesthesia Post Note  Patient: Brian Leonard  Procedure(s) Performed: PERICARDIAL WINDOW (N/A )     Patient location during evaluation: PACU Anesthesia Type: General Level of consciousness: awake Pain management: pain level controlled Vital Signs Assessment: post-procedure vital signs reviewed and stable Respiratory status: spontaneous breathing, nonlabored ventilation, respiratory function stable and patient connected to nasal cannula oxygen Cardiovascular status: blood pressure returned to baseline and stable Postop Assessment: no apparent nausea or vomiting Anesthetic complications: no   No complications documented.  Last Vitals:  Vitals:   11/10/20 2300 11/10/20 2330  BP: 118/89 (!) 120/91  Pulse: (!) 104 96  Resp: 16 (!) 21  Temp:    SpO2: 97% 97%    Last Pain:  Vitals:   11/10/20 2330  TempSrc:   PainSc: Asleep                 [name]

## 2020-11-10 NOTE — Progress Notes (Signed)
ANTICOAGULATION CONSULT NOTE - Initial Consult  Pharmacy Consult for heparin Indication: DVT  Allergies  Allergen Reactions  . Iodine Anaphylaxis  . Shellfish Allergy Anaphylaxis  . Other Hives and Swelling    Reaction to unknown oral asthma medication (pt has taken Theo Dur in the past - 10 yrs ago with no reaction)    Patient Measurements: Height: 5\' 11"  (180.3 cm) Weight: 61.7 kg (136 lb) IBW/kg (Calculated) : 75.3 Heparin Dosing Weight: 61.7 kg  Vital Signs: Temp: 98.4 F (36.9 C) (03/07 2005) Temp Source: Oral (03/07 2005) BP: 113/93 (03/08 0328) Pulse Rate: 118 (03/08 0328)  Labs: Recent Labs    11/09/20 2242 11/10/20 0114  HGB 12.2*  --   HCT 35.3*  --   PLT 288  --   CREATININE 0.79  --   TROPONINIHS  --  16    Estimated Creatinine Clearance: 87.8 mL/min (by C-G formula based on SCr of 0.79 mg/dL).   Medical History: Past Medical History:  Diagnosis Date  . Asthma     Medications:  See medication history  Assessment: 61 yo man to start heparin for thrombus in brachiocephalic vein.  He was not on anticoagulation PTA.  Hg 12.2, PTLC 288 Goal of Therapy:  Heparin level 0.3-0.7 units/ml Monitor platelets by anticoagulation protocol: Yes   Plan:  Heparin bolus 4000 units and drip at 1000 units/hr Check heparin level 6 hours after start Daily HL and CBC while on heparin Monitor for bleeding complications  Brian Leonard 11/10/2020,3:43 AM

## 2020-11-10 NOTE — H&P (Signed)
History and Physical    Brian Leonard BZJ:[id number] DOB: Aug 18, 1961 DOA: 11/09/2020  PCP: Patient, No Pcp Per   Patient coming from: Home   Chief Complaint: Back pain, chest pain, cough   HPI: Brian Leonard is a 60 y.o. male with medical history significant for tobacco abuse, now presenting to the emergency department for evaluation of back pain, chest pain, and cough.  Patient reports some chronic back pain and, but developed more severe pain over the past month which is different in character from his prior pain, involving the upper back between the scapulae as well as lower back.  He has also been experiencing pain in the central chest and increased cough which had been productive of some white sputum but is now mainly nonproductive.  There was no inciting event for this.  Both back and chest pain is much worse when coughing.  He denies any fevers, chills, unintentional weight loss, or night sweats.  Reports that he has always been very thin.  He continues to smoke approximately 7 cigarettes/day but plans to quit.  He drinks only an occasional beer but has history of excessive use remotely.  Has never lived in a homeless shelter but has been incarcerated for short periods.  Does not know of anyone with TB.  ED Course: Upon arrival to the ED, patient is found to be afebrile, saturating well on room air, slightly tachypneic, tachycardic in the 110s, and with stable blood pressure.  Chemistry panel and CBC largely unremarkable.  CT chest concerning for extensive necrotic adenopathy, necrotic right lower lobe mass, numerous scattered micronodules throughout bilateral lungs, pericardial effusion, bone lesions involving T9 and bilateral ribs, suspected thrombus in the left brachiocephalic vein, and partially visualized pancreatic tail lesion among other findings.  Patient was given a liter of saline, Zofran, fentanyl, and started on IV heparin in the ED.  COVID-19 screening test was negative.  Review of  Systems:  All other systems reviewed and apart from HPI, are negative.  Past Medical History:  Diagnosis Date  . Asthma     Past Surgical History:  Procedure Laterality Date  . CERVICAL SPINE SURGERY      Social History:   reports that he has been smoking. He has never used smokeless tobacco. He reports current alcohol use. He reports current drug use. Drug: Marijuana.  Allergies  Allergen Reactions  . Iodine Anaphylaxis  . Shellfish Allergy Anaphylaxis  . Other Hives and Swelling    Reaction to unknown oral asthma medication (pt has taken Theo Dur in the past - 10 yrs ago with no reaction)    History reviewed. No pertinent family history.   Prior to Admission medications   Medication Sig Start Date End Date Taking? Authorizing Provider  acetaminophen (TYLENOL) 500 MG tablet Take 1,000 mg by mouth every 6 (six) hours as needed for headache or moderate pain.   Yes [provider]  ibuprofen (ADVIL) 200 MG tablet Take 400 mg by mouth every 6 (six) hours as needed for headache or moderate pain.   Yes [provider]  Multiple Vitamin (MULTIVITAMIN WITH MINERALS) TABS tablet Take 1 tablet by mouth daily.   Yes [provider]    Physical Exam: Vitals:   11/09/20 1923 11/09/20 2005 11/10/20 0007  BP: 126/90 (!) 127/98 121/88  Pulse: (!) 118 (!) 114 (!) 113  Resp: 18 (!) 22 20  Temp: 98.4 F (36.9 C) 98.4 F (36.9 C)   TempSrc: Oral Oral   SpO2: 99% 99%  97%    Constitutional: NAD, calm  Eyes: PERTLA, lids and conjunctivae normal ENMT: Mucous membranes are moist. Posterior pharynx clear of any exudate or lesions.   Neck: supple, no thyromegaly, distended neck veins  Respiratory: no wheezing, no crackles. No accessory muscle use.  Cardiovascular: Rate ~120 and regular. No extremity edema.   Abdomen: No distension, no tenderness, soft. Bowel sounds active.  Musculoskeletal: no clubbing / cyanosis. No joint deformity upper and lower extremities.    Skin: no significant rashes, lesions, ulcers. Warm, dry, well-perfused. Neurologic: CN 2-12 grossly intact. Sensation intact. Moving all extremities.  Psychiatric: Alert and oriented to person, place, and situation. Very pleasant and cooperative.    Labs and Imaging on Admission: I have personally reviewed following labs and imaging studies  CBC: Recent Labs  Lab 11/09/20 2242  WBC 8.1  NEUTROABS 5.9  HGB 12.2*  HCT 35.3*  MCV 95.9  PLT 086   Basic Metabolic Panel: Recent Labs  Lab 11/09/20 2242  NA 138  K 3.6  CL 107  CO2 21*  GLUCOSE 122*  BUN 14  CREATININE 0.79  CALCIUM 9.0   GFR: CrCl cannot be calculated (Unknown ideal weight.). Liver Function Tests: Recent Labs  Lab 11/09/20 2242  AST 20  ALT 18  ALKPHOS 93  BILITOT 0.8  PROT 6.1*  ALBUMIN 3.2*   Recent Labs  Lab 11/10/20 0114  LIPASE 68*   No results for input(s): AMMONIA in the last 168 hours. Coagulation Profile: No results for input(s): INR, PROTIME in the last 168 hours. Cardiac Enzymes: No results for input(s): CKTOTAL, CKMB, CKMBINDEX, TROPONINI in the last 168 hours. BNP (last 3 results) No results for input(s): PROBNP in the last 8760 hours. HbA1C: No results for input(s): HGBA1C in the last 72 hours. CBG: No results for input(s): GLUCAP in the last 168 hours. Lipid Profile: No results for input(s): CHOL, HDL, LDLCALC, TRIG, CHOLHDL, LDLDIRECT in the last 72 hours. Thyroid Function Tests: No results for input(s): TSH, T4TOTAL, FREET4, T3FREE, THYROIDAB in the last 72 hours. Anemia Panel: No results for input(s): VITAMINB12, FOLATE, FERRITIN, TIBC, IRON, RETICCTPCT in the last 72 hours. Urine analysis: No results found for: COLORURINE, APPEARANCEUR, LABSPEC, PHURINE, GLUCOSEU, HGBUR, BILIRUBINUR, KETONESUR, PROTEINUR, UROBILINOGEN, NITRITE, LEUKOCYTESUR Sepsis Labs: @LABRCNTIP (procalcitonin:4,lacticidven:4) ) Recent Results (from the past 240 hour(s))  Resp Panel by RT-PCR (Flu  A&B, Covid) Nasopharyngeal Swab     Status: None   Collection Time: 11/10/20  1:55 AM   Specimen: Nasopharyngeal Swab; Nasopharyngeal(NP) swabs in vial transport medium  Result Value Ref Range Status   SARS Coronavirus 2 by RT PCR NEGATIVE NEGATIVE Final    Comment: (NOTE) SARS-CoV-2 target nucleic acids are NOT DETECTED.  The SARS-CoV-2 RNA is generally detectable in upper respiratory specimens during the acute phase of infection. The lowest concentration of SARS-CoV-2 viral copies this assay can detect is 138 copies/mL. A negative result does not preclude SARS-Cov-2 infection and should not be used as the sole basis for treatment or other patient management decisions. A negative result may occur with  improper specimen collection/handling, submission of specimen other than nasopharyngeal swab, presence of viral mutation(s) within the areas targeted by this assay, and inadequate number of viral copies(<138 copies/mL). A negative result must be combined with clinical observations, patient history, and epidemiological information. The expected result is Negative.  Fact Sheet for Patients:  EntrepreneurPulse.com.au  Fact Sheet for Healthcare Providers:  IncredibleEmployment.be  This test is no t yet approved or cleared by the [place]  FDA and  has been authorized for detection and/or diagnosis of SARS-CoV-2 by FDA under an Emergency Use Authorization (EUA). This EUA will remain  in effect (meaning this test can be used) for the duration of the COVID-19 declaration under Section 564(b)(1) of the Act, 21 U.S.C.section 360bbb-3(b)(1), unless the authorization is terminated  or revoked sooner.       Influenza A by PCR NEGATIVE NEGATIVE Final   Influenza B by PCR NEGATIVE NEGATIVE Final    Comment: (NOTE) The Xpert Xpress SARS-CoV-2/FLU/RSV plus assay is intended as an aid in the diagnosis of influenza from Nasopharyngeal swab specimens  and should not be used as a sole basis for treatment. Nasal washings and aspirates are unacceptable for Xpert Xpress SARS-CoV-2/FLU/RSV testing.  Fact Sheet for Patients: EntrepreneurPulse.com.au  Fact Sheet for Healthcare Providers: IncredibleEmployment.be  This test is not yet approved or cleared by the [place] FDA and has been authorized for detection and/or diagnosis of SARS-CoV-2 by FDA under an Emergency Use Authorization (EUA). This EUA will remain in effect (meaning this test can be used) for the duration of the COVID-19 declaration under Section 564(b)(1) of the Act, 21 U.S.C. section 360bbb-3(b)(1), unless the authorization is terminated or revoked.  Performed at [facility], [place] [address], [place], [place] [address]      Radiological Exams on Admission: DG Chest 2 View  Result Date: 11/09/2020 CLINICAL DATA:  Back pain and productive cough. EXAM: CHEST - 2 VIEW COMPARISON:  None. FINDINGS: Lungs are hyperexpanded. Soft tissue fullness noted in the right hilum. Left lung unremarkable. No pleural effusion. The visualized bony structures of the thorax show no acute abnormality. IMPRESSION: Soft tissue fullness in the right hilum. CT chest with contrast recommended to further evaluate as lymphadenopathy/neoplasm a concern. Electronically Signed   By: Misty Stanley M.D.   On: 11/09/2020 20:21   CT Chest W Contrast  Addendum Date: 11/10/2020   ADDENDUM REPORT: 11/10/2020 01:06 ADDENDUM: Additional small centrally necrotic appearing deposits appear to be located either along or adjacent the right hemidiaphragm, either within the lung base or subphrenic space (3/150). Addendum was called by telephone at the time of interpretation on 11/10/2020 at 1:06 am to provider GARRETT GREEN , who verbally acknowledged these results. Electronically Signed   By: Lovena Le M.D.   On: 11/10/2020 01:06   Result Date: 11/10/2020 CLINICAL DATA:   Abnormal radiograph, concern for mass EXAM: CT CHEST WITH CONTRAST TECHNIQUE: Multidetector CT imaging of the chest was performed during intravenous contrast administration. CONTRAST:  67mL OMNIPAQUE IOHEXOL 300 MG/ML  SOLN COMPARISON:  Radiograph 11/09/2020 FINDINGS: Cardiovascular: Normal cardiac size. Intermediate attenuation pericardial effusion, possibly malignant given the bulky mediastinal and hilar nodal disease. The aortic root is suboptimally assessed given cardiac pulsation artifact. The aorta is normal caliber. Aberrant right subclavian artery with small diverticulum of Kommerell. Proximal great vessels are otherwise unremarkable. Central pulmonary arteries are top-normal caliber. No large central or lobar filling defects within limitations of this non tailored examination of the pulmonary arteries. Reflux of contrast into the IVC and hepatic veins. Hypoattenuating filling defect is seen extending from the within the left brachiocephalic vein 7/78-24, clear this reflects direct extension or merely thrombus in the setting of compression by the bulky disease Mediastinum/Nodes: Extensive mediastinal, hilar and supraclavicular adenopathy throughout the chest bilaterally. Some central hypoattenuation concerning necrotic adenopathy. Largest supraclavicular nodal deposit noted on the right measuring up to 3.1 x 3 cm in size (3/23). Paratracheal node measuring 3.3 x 1.9  cm (3/70). Right hilar deposit measuring 3.3 x 3.2 cm (3/90). Conglomerate adenopathy noted in the AP window and prevascular space as well. No acute abnormality of the trachea or esophagus. Hypoattenuating nodule in the thyroid isthmus measuring up to 1.7 cm in size. Lungs/Pleura: Centrally hypoattenuating lobular mass lesion seen in the posterosuperior segment right lower lobe measuring approximately 2.3 x 2.4 cm in size (4/93). Additional smaller nodules are seen throughout both lungs. Findings are on a background of diffuse centrilobular and  paraseptal emphysematous changes as well as bronchitic features with airways thickening and scattered secretions. Regions of bandlike scarring and/or atelectasis are noted throughout both lungs. Trace right effusion with adjacent atelectasis. No left effusion. Upper Abdomen: Heterogeneous, largely hypoattenuating focus in the tail of the pancreas measuring 2.5 x 2.1 cm (3/21). Indeterminate hyperattenuating focus in the posterior right lobe liver measuring 1.3 x 1.3 cm (3/182), possible flash filling hemangioma though should be viewed with conspicuity given additional findings. No other acute abnormality in the upper abdomen. Musculoskeletal: Lucent lesion seen involving the T9 anterior inferior vertebral body with some mild soft tissue extension. Additional lucent lesion in the posterior left ninth rib (3/114) some ill defined sclerotic change in the right seventh rib laterally. No other consider acute or conspicuous osseous lesions. IMPRESSION: 1. 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 Differential considerations could include a primary pulmonary malignancy with widespread metastases versus metastatic disease from a partially visualized pancreatic tail lesion. Alternatively, atypical infection such as mycobacterial/tuberculosis should be excluded as well. 2. Trace right pleural effusion. 3. Reflux of contrast in the hepatic veins and IVC could reflect some elevated right heart pressures/right-sided dysfunction. Correlate with echocardiogram particularly given the presence pericardial effusion. 4. Hypoattenuating filling defect is seen in the left brachiocephalic vein, unclear if this could reflect extension from the nodal disease or  merely bland thrombus with compression of the left brachiocephalic vein as it passes through the extensive prevascular deposits. No large central or lobar filling defects are seen within the pulmonary arteries though evaluation limited on this non tailored examination. 5. Lucent lesion involving the T9 anterior inferior vertebral body with some mild soft tissue extension. Additional lucent lesion in the posterior left ninth rib. Additional ill defined sclerotic change in the right seventh rib laterally. 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 6. Indeterminate hyperattenuating focus in the posterior right lobe liver measuring 1.3 x 1.3 cm, possible flash filling hemangioma though should be viewed with conspicuity given additional findings. 7. Hypoattenuating 1.7 cm nodule in the thyroid isthmus, Recommend thyroid US (ref: J Am Coll Radiol. 2015 Feb;12(2): 143-50). 8. Aberrant right subclavian artery. 9. Emphysema (ICD10-J43.9). These results were called by telephone at the time of interpretation on 11/10/2020 at 12:49 am to provider PA McDonald, who verbally acknowledged these results. Electronically Signed: By: Lovena Le M.D. On: 11/10/2020 00:51   Assessment/Plan  1. Pericardial effusion  - Presents with cough, pleuritic pain, and back pain and is found on CT chest to have likely metastatic disease with pericardial effusion  - Dilated neck veins and tachycardia noted without hypotension or pulsus paradoxus  - Check EKG, echocardiogram    2. Lung mass; necrotic lymphadenopathy; bone lesions; pancreatic tail lesion  - Presents with a month of increased cough, pleuritic pain, and back pain and  is found on chest CT to have extensive necrotic LAD, necrotic RLL mass, numerous b/l nodules, lucent T9 lesion with soft tissue extension, left 9th rib and right 7th rib lesions, hyperattenuating focus in right lobe of liver, and partially visualized pancreatic tail lesion  - Primary pulmonary  malignancy with widespread metastases, pancreatic primary, or less likely atypical infection  - Check CT abdomen/pelvis, sputum AFB smears and culture    3. Brachiocephalic thrombosis  - Suspected left brachiocephalic vein thrombus noted on CT in ED  - Continue IV heparin for now   4. Thyroid nodule  - Thyroid nodule noted on CT with [place] recommended    5. Tobacco abuse  - Patient motivated to quit, nicotine patch provided     DVT prophylaxis: IV heparin  Code Status: Full  Level of Care: Level of care: Telemetry Cardiac Family Communication: Patient did not want anyone updated tonight  Disposition Plan:  Patient is from: Home  Anticipated d/c is to: TBD Anticipated d/c date is: 11/12/20 Patient currently: Pending echocardiogram, CT abd/pelvis, conversion to oral anticoagulant, possible bronchoscopy or image-guided biopsy  Consults called: None  Admission status: Inpatient     Vianne Bulls, MD [facility]  11/10/2020, 3:14 AM

## 2020-11-10 NOTE — Op Note (Signed)
  CARDIOVASCULAR SURGERY OPERATIVE NOTE  11/10/2020  Surgeon:  Gaye Pollack, MD  First Assistant: RNFA   Preoperative Diagnosis:  Large pericardial effusion with tamponade   Postoperative Diagnosis:  Same  Procedure:  Subxyphoid pericardial window   Anesthesia:  General Endotracheal   Clinical History/Surgical Indication:  This 60 year old gentleman has a mass in the superior segment of the RLL with extensive mediastinal, hilar and supraclavicular adenopathy, bone lesions in T9 vertebral body and left 9th rib and possibly right 7th rib, with a large pericardial effusion and signs of tamponade. I suspect this is metastatic cancer. He has borderline hemodynamics and will require emergent subxyphoid pericardial window for drainage of the effusion and to obtain fluid and tissue for diagnosis. I discussed the operative procedure with the patient including alternatives, benefits and risks; including but not limited to bleeding, blood transfusion, infection, injury to the heart, recurrent effusion,  organ dysfunction, and death.  Brian Leonard understands and agrees to proceed.    Preparation:  The patient was taken directly to the operating room 14.  The consent was signed by me. Preoperative antibiotics were given.  The patient was positioned supine on the operating room table. After being placed under general endotracheal anesthesia by the anesthesia team a foley catheter was placed. The neck, chest, and abdomen were prepped with betadine soap and solution and draped in the usual sterile manner. A surgical time-out was taken and the correct patient and operative procedure were confirmed with the nursing and anesthesia staff.   TEE:  Performed by Dr. Roanna Banning. This showed a large circumferential pericardial effusion with signs of tamponade.  Subxyphoid pericardial window:  A 4 cm incision was made over the xyphoid process and carried down through the subcutaneous tissue using cautery  until the midline fascia was encountered. The fascia was divided in the midline and the subxyphoid space entered. The anterior pericardium was visualized just at the diaphragm and it was opened with cautery. The pericardial space was entered and about 350 cc of dark bloody fluid was removed. TEE confirmed that all of the fluid was removed. BP immediately increased and pulse decreased. A 28 F Blake drain was placed in the inferior pericardial space through a separate small incision. Hemostasis was complete. The midline fascia was approximated with interrupted #0 vicryl sutures. The subcutaneous tissue was approximated with continuous 2-0 vicryl suture and the skin with 3-0 vicryl subcuticular suture. The sponge, needle, and instrument counts were correct according to the nurses. Dermabond was applied to the incision. The patient was awakened, extubated, and transported to the PACU in stable condition.

## 2020-11-10 NOTE — Progress Notes (Signed)
Urgent case limited pre-op care due to urgent nature of procedure.

## 2020-11-10 NOTE — Brief Op Note (Signed)
11/10/2020  8:14 PM  PATIENT:  Brian Leonard  60 y.o. male  PRE-OPERATIVE DIAGNOSIS:  pericardial effusion  POST-OPERATIVE DIAGNOSIS:  pericardial effusion  PROCEDURE:  Procedure(s): PERICARDIAL WINDOW (N/A)  SURGEON:  Surgeon(s) and Role:    * Trinty Marken, Fernande Boyden, MD - Primary  PHYSICIAN ASSISTANT: none  ASSISTANTS: RNFA   ANESTHESIA:   general  EBL:  50 mL   BLOOD ADMINISTERED:none  DRAINS: (29F) Blake drain(s) in the pericardium   LOCAL MEDICATIONS USED:  NONE  SPECIMEN:  Source of Specimen:  pericardial fluid and pericardial tissue   350 cc of bloody pericardial fluid drained.   DISPOSITION OF SPECIMEN:  PATHOLOGY  COUNTS:  YES  TOURNIQUET:  * No tourniquets in log *  DICTATION: .Note written in EPIC  PLAN OF CARE: Admit to inpatient   PATIENT DISPOSITION:  PACU - hemodynamically stable.   Delay start of Pharmacological VTE agent (>24hrs) due to surgical blood loss or risk of bleeding: yes

## 2020-11-10 NOTE — ED Notes (Signed)
Lab called stating something happened to the cbc sent down earlier and we need a redraw again.

## 2020-11-10 NOTE — Anesthesia Procedure Notes (Signed)
Procedure Name: Intubation Date/Time: 11/10/2020 7:12 PM Performed by: Jearld Pies, CRNA Pre-anesthesia Checklist: Patient identified, Emergency Drugs available, Suction available and Patient being monitored Patient Re-evaluated:Patient Re-evaluated prior to induction Oxygen Delivery Method: Circle System Utilized Preoxygenation: Pre-oxygenation with 100% oxygen Induction Type: IV induction, Rapid sequence and Cricoid Pressure applied Laryngoscope Size: Miller and 2 Grade View: Grade I Tube type: Oral Tube size: 8.0 mm Number of attempts: 1 Airway Equipment and Method: Stylet and Oral airway Placement Confirmation: ETT inserted through vocal cords under direct vision,  positive ETCO2 and breath sounds checked- equal and bilateral Secured at: 22 cm Tube secured with: Tape Dental Injury: Teeth and Oropharynx as per pre-operative assessment

## 2020-11-10 NOTE — Progress Notes (Signed)
Brian Leonard is a 60 y.o. male with medical history significant for tobacco abuse, now presenting to the emergency department for evaluation of back pain and cough.  Patient reports some chronic back pain and, but developed more severe pain over the past month which is different in character from his prior pain, involving the upper back between the scapulae as well as lower back.  Increased cough which had been productive of some white sputum but is now mainly nonproductive.  There was no inciting event for this.  He denies any fevers, chills, unintentional weight loss, or night sweats.  Reports that he has always been very thin.  He continues to smoke approximately 7 cigarettes/day but plans to quit.  He drinks only an occasional beer but has history of excessive use remotely.  Has never lived in a homeless shelter but has been incarcerated for short periods.  Does not know of anyone with TB.  11/10/20:  Patient was seen and examined at his bedside.  He reports diffused joints pain.  Denies any chest pain.  Work up concerning for possible pulmonary malignancy with bony, liver involvement versus infective process.  Dr. Lindi Adie, medical oncology, was consulted to assist with management.  Please refer to H&P dictated by my partner Dr. Myna Hidalgo on 11/10/2020 for further details of the assessment and plan.

## 2020-11-10 NOTE — Transfer of Care (Signed)
Immediate Anesthesia Transfer of Care Note  Patient: Brian Leonard  Procedure(s) Performed: PERICARDIAL WINDOW (N/A )  Patient Location: PACU  Anesthesia Type:General  Level of Consciousness: awake, alert  and oriented  Airway & Oxygen Therapy: Patient Spontanous Breathing and Patient connected to face mask oxygen  Post-op Assessment: Report given to RN and Post -op Vital signs reviewed and stable  Post vital signs: Reviewed and stable  Last Vitals:  Vitals Value Taken Time  BP 140/101 11/10/20 2031  Temp    Pulse 102 11/10/20 2033  Resp 27 11/10/20 2034  SpO2 95 % 11/10/20 2033  Vitals shown include unvalidated device data.  Last Pain:  Vitals:   11/10/20 1805  TempSrc: Oral  PainSc:      Right nare noted to have nose bleed post nasal airway insertion, removed, pressure, Afrin spray, resolved. VSS.    Complications: No complications documented.

## 2020-11-10 NOTE — Progress Notes (Signed)
ANTICOAGULATION CONSULT NOTE - Initial Consult  Pharmacy Consult for heparin Indication: DVT  Allergies  Allergen Reactions  . Iodine Anaphylaxis  . Shellfish Allergy Anaphylaxis  . Other Hives and Swelling    Reaction to unknown oral asthma medication (pt has taken Theo Dur in the past - 10 yrs ago with no reaction)    Patient Measurements: Height: 5\' 11"  (180.3 cm) Weight: 61.7 kg (136 lb) IBW/kg (Calculated) : 75.3 Heparin Dosing Weight: 61.7 kg  Vital Signs: BP: 104/87 (03/08 0645) Pulse Rate: 108 (03/08 0645)  Labs: Recent Labs    11/09/20 2242 11/10/20 0114 11/10/20 0304 11/10/20 0408 11/10/20 0935  HGB 12.2*  --   --   --   --   HCT 35.3*  --   --   --   --   PLT 288  --   --   --   --   HEPARINUNFRC  --   --   --   --  0.18*  CREATININE 0.79  --   --  0.80  --   TROPONINIHS  --  16 16  --   --     Estimated Creatinine Clearance: 87.8 mL/min (by C-G formula based on SCr of 0.8 mg/dL).   Medical History: Past Medical History:  Diagnosis Date  . 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  He was not on anticoagulation PTA.  Hg 12.2, PTLC 288  3/8 @0935  HL 0.18 - subtherapeutic on 1000 units/hr rate. Per RN no infusion issues noted.   Goal of Therapy:  Heparin level 0.3-0.7 units/ml Monitor platelets by anticoagulation protocol: Yes   Plan:  Heparin bolus 2000 units and increase drip to 1200 units/hr Check heparin level 6 hours Daily HL and CBC while on heparin Monitor for bleeding complications Follow up transition to oral anticoagulation  Jacobo Forest PharmD Candidate 2022 11/10/2020 10:53 AM

## 2020-11-10 NOTE — ED Notes (Signed)
Pt drinking contrast now

## 2020-11-10 NOTE — Anesthesia Preprocedure Evaluation (Addendum)
Anesthesia Evaluation  Patient identified by MRN, date of birth, ID band Patient awake    Reviewed: Allergy & Precautions, NPO status , Patient's Chart, lab work & pertinent test resultsPreop documentation limited or incomplete due to emergent nature of procedure.  Airway Mallampati: III       Dental  (+) Edentulous Upper, Edentulous Lower   Pulmonary asthma , Current Smoker,     + decreased breath sounds      Cardiovascular  Rate:Tachycardia  ECG: ST, rate 660  Brachiocephalic vein thrombosis  Echocardiogram being performed as we speak in the emergency room shows a moderate to large pericardial effusion with clear-cut evidence of pericardial tamponade including plethoric IVC, right atrial and right ventricular diastolic collapse, increased respiratory variation in mitral flow.   Neuro/Psych negative neurological ROS  negative psych ROS   GI/Hepatic negative GI ROS, Neg liver ROS,   Endo/Other  negative endocrine ROS  Renal/GU negative Renal ROS     Musculoskeletal negative musculoskeletal ROS (+)   Abdominal   Peds  Hematology  (+) anemia ,   Anesthesia Other Findings Pericardial effusion  Reproductive/Obstetrics                            Anesthesia Physical Anesthesia Plan  ASA: IV and emergent  Anesthesia Plan: General   Post-op Pain Management:    Induction: Intravenous  PONV Risk Score and Plan: 1 and Ondansetron, Dexamethasone, Midazolam, Treatment may vary due to age or medical condition and Scopolamine patch - Pre-op  Airway Management Planned: Oral ETT  Additional Equipment: Arterial line and TEE  Intra-op Plan:   Post-operative Plan: Extubation in OR  Informed Consent: I have reviewed the patients History and Physical, chart, labs and discussed the procedure including the risks, benefits and alternatives for the proposed anesthesia with the patient or authorized  representative who has indicated his/her understanding and acceptance.       Plan Discussed with: CRNA and Surgeon  Anesthesia Plan Comments: [place] Dates for intraoperative monitoring only Potential central line placement discussed)     Anesthesia Quick Evaluation

## 2020-11-10 NOTE — Consult Note (Signed)
[address]       [place] [address]             [phone]      Cardiothoracic Surgery Consultation  Reason for Consult: large pericardial effusion with tamponade Referring Physician: Dr. Tawny Asal Nelles is an 60 y.o. male.  HPI:   The patient is a 60 year old smoker who presented to the ER yesterday with complaints of cough, shortness of breath, orthopnea, pleuritic chest pain and back pain that have been going on for about a month and worsening. CXR last night showed soft tissue fullness in the right hilum and he had a CT of the chest, abdomen and pelvis early this morning showing extensive mediastinal, hilar and supraclavicular lymphadenopathy with a necrotic appearing mass in the superior segment of the RLL lung with numerous scattered nodules. There is also a large pericardial effusion. 2D echo was just done late this afternoon and shows a moderate to large pericardial effusion with signs of tamponade.  Past Medical History:  Diagnosis Date  . Asthma     Past Surgical History:  Procedure Laterality Date  . CERVICAL SPINE SURGERY      History reviewed. No pertinent family history.  Social History:  reports that he has been smoking. He has never used smokeless tobacco. He reports current alcohol use. He reports current drug use. Drug: Marijuana.  Allergies:  Allergies  Allergen Reactions  . Iodine Anaphylaxis  . Shellfish Allergy Anaphylaxis  . Other Hives and Swelling    Reaction to unknown oral asthma medication (pt has taken Theo Dur in the past - 10 yrs ago with no reaction)    Medications:  I have reviewed the patient's current medications. Prior to Admission: (Not in a hospital admission)  Scheduled: . [START ON 11/11/2020] insulin aspart  0-9 Units Subcutaneous TID WC  . iohexol      . levalbuterol  0.63 mg Nebulization Q6H  . nicotine  14 mg Transdermal Daily   Continuous: . sodium chloride 100 mL/hr at 11/10/20 1655  .  heparin 1,200 Units/hr (11/10/20 1109)   JGO:TLXBWIOMBTDHR **OR** acetaminophen, bisacodyl, fentaNYL (SUBLIMAZE) injection, guaiFENesin, HYDROcodone-acetaminophen, metoprolol tartrate, ondansetron **OR** ondansetron (ZOFRAN) IV, senna-docusate  Results for orders placed or performed during the hospital encounter of 11/09/20 (from the past 48 hour(s))  CBC with Differential     Status: Abnormal   Collection Time: 11/09/20 10:42 PM  Result Value Ref Range   WBC 8.1 4.0 - 10.5 K/uL   RBC 3.68 (L) 4.22 - 5.81 MIL/uL   Hemoglobin 12.2 (L) 13.0 - 17.0 g/dL   HCT 35.3 (L) 39.0 - 52.0 %   MCV 95.9 80.0 - 100.0 fL   MCH 33.2 26.0 - 34.0 pg   MCHC 34.6 30.0 - 36.0 g/dL   RDW 13.4 11.5 - 15.5 %   Platelets 288 150 - 400 K/uL   nRBC 0.0 0.0 - 0.2 %   Neutrophils Relative % 73 %   Neutro Abs 5.9 1.7 - 7.7 K/uL   Lymphocytes Relative 13 %   Lymphs Abs 1.1 0.7 - 4.0 K/uL   Monocytes Relative 11 %   Monocytes Absolute 0.9 0.1 - 1.0 K/uL   Eosinophils Relative 3 %   Eosinophils Absolute 0.2 0.0 - 0.5 K/uL   Basophils Relative 0 %   Basophils Absolute 0.0 0.0 - 0.1 K/uL   Immature Granulocytes 0 %   Abs Immature Granulocytes 0.03 0.00 - 0.07 K/uL  Comment: Performed at [facility], [place] [address] [place], [place] [address]  Comprehensive metabolic panel     Status: Abnormal   Collection Time: 11/09/20 10:42 PM  Result Value Ref Range   Sodium 138 135 - 145 mmol/L   Potassium 3.6 3.5 - 5.1 mmol/L   Chloride 107 98 - 111 mmol/L   CO2 21 (L) 22 - 32 mmol/L   Glucose, Bld 122 (H) 70 - 99 mg/dL    Comment: Glucose reference range applies only to samples taken after fasting for at least 8 hours.   BUN 14 6 - 20 mg/dL   Creatinine, Ser 0.79 0.61 - 1.24 mg/dL   Calcium 9.0 8.9 - 10.3 mg/dL   Total Protein 6.1 (L) 6.5 - 8.1 g/dL   Albumin 3.2 (L) 3.5 - 5.0 g/dL   AST 20 15 - 41 U/L   ALT 18 0 - 44 U/L   Alkaline Phosphatase 93 38 - 126 U/L   Total Bilirubin 0.8 0.3 - 1.2 mg/dL   GFR,  Estimated >60 >60 mL/min    Comment: (NOTE) Calculated using the CKD-EPI Creatinine Equation (2021)    Anion gap 10 5 - 15    Comment: Performed at [facility], [place] [address], [place], [place] [address]  Troponin I (High Sensitivity)     Status: None   Collection Time: 11/10/20  1:14 AM  Result Value Ref Range   Troponin I (High Sensitivity) 16 <18 ng/L    Comment: (NOTE) Elevated high sensitivity troponin I (hsTnI) values and significant  changes across serial measurements may suggest ACS but many other  chronic and acute conditions are known to elevate hsTnI results.  Refer to the "Links" section for chest pain algorithms and additional  guidance. Performed at [facility], [place] [address] [place], [place] [address]   Lipase, blood     Status: Abnormal   Collection Time: 11/10/20  1:14 AM  Result Value Ref Range   Lipase 68 (H) 11 - 51 U/L    Comment: Performed at [facility] [address], [place],  [address]  Resp Panel by RT-PCR (Flu A&B, Covid) Nasopharyngeal Swab     Status: None   Collection Time: 11/10/20  1:55 AM   Specimen: Nasopharyngeal Swab; Nasopharyngeal(NP) swabs in vial transport medium  Result Value Ref Range   SARS Coronavirus 2 by RT PCR NEGATIVE NEGATIVE    Comment: (NOTE) SARS-CoV-2 target nucleic acids are NOT DETECTED.  The SARS-CoV-2 RNA is generally detectable in upper respiratory specimens during the acute phase of infection. The lowest concentration of SARS-CoV-2 viral copies this assay can detect is 138 copies/mL. A negative result does not preclude SARS-Cov-2 infection and should not be used as the sole basis for treatment or other patient management decisions. A negative result may occur with  improper specimen collection/handling, submission of specimen other than nasopharyngeal swab, presence of viral mutation(s) within the areas targeted by this assay, and inadequate number of viral copies(<138 copies/mL). A  negative result must be combined with clinical observations, patient history, and epidemiological information. The expected result is Negative.  Fact Sheet for Patients:  EntrepreneurPulse.com.au  Fact Sheet for Healthcare Providers:  IncredibleEmployment.be  This test is no t yet approved or cleared by the [place] FDA and  has been authorized for detection and/or diagnosis of SARS-CoV-2 by FDA under an Emergency Use Authorization (EUA). This EUA will remain  in effect (meaning this test can be  used) for the duration of the COVID-19 declaration under Section 564(b)(1) of the Act, 21 U.S.C.section 360bbb-3(b)(1), unless the authorization is terminated  or revoked sooner.       Influenza A by PCR NEGATIVE NEGATIVE   Influenza B by PCR NEGATIVE NEGATIVE    Comment: (NOTE) The Xpert Xpress SARS-CoV-2/FLU/RSV plus assay is intended as an aid in the diagnosis of influenza from Nasopharyngeal swab specimens and should not be used as a sole basis for treatment. Nasal washings and aspirates are unacceptable for Xpert Xpress SARS-CoV-2/FLU/RSV testing.  Fact Sheet for Patients: EntrepreneurPulse.com.au  Fact Sheet for Healthcare Providers: IncredibleEmployment.be  This test is not yet approved or cleared by the [place] FDA and has been authorized for detection and/or diagnosis of SARS-CoV-2 by FDA under an Emergency Use Authorization (EUA). This EUA will remain in effect (meaning this test can be used) for the duration of the COVID-19 declaration under Section 564(b)(1) of the Act, 21 U.S.C. section 360bbb-3(b)(1), unless the authorization is terminated or revoked.  Performed at [facility], [place] [address], [place], [place] [address]   Troponin I (High Sensitivity)     Status: None   Collection Time: 11/10/20  3:04 AM  Result Value Ref Range   Troponin I (High Sensitivity) 16 <18 ng/L     Comment: (NOTE) Elevated high sensitivity troponin I (hsTnI) values and significant  changes across serial measurements may suggest ACS but many other  chronic and acute conditions are known to elevate hsTnI results.  Refer to the "Links" section for chest pain algorithms and additional  guidance. Performed at [facility], [place] [address], [place], [place] [address]   Basic metabolic panel     Status: Abnormal   Collection Time: 11/10/20  4:08 AM  Result Value Ref Range   Sodium 137 135 - 145 mmol/L   Potassium 3.5 3.5 - 5.1 mmol/L   Chloride 106 98 - 111 mmol/L   CO2 21 (L) 22 - 32 mmol/L   Glucose, Bld 174 (H) 70 - 99 mg/dL    Comment: Glucose reference range applies only to samples taken after fasting for at least 8 hours.   BUN 13 6 - 20 mg/dL   Creatinine, Ser 0.80 0.61 - 1.24 mg/dL   Calcium 8.9 8.9 - 10.3 mg/dL   GFR, Estimated >60 >60 mL/min    Comment: (NOTE) Calculated using the CKD-EPI Creatinine Equation (2021)    Anion gap 10 5 - 15    Comment: Performed at [facility] [address] [place], [place] [address]  Heparin level (unfractionated)     Status: Abnormal   Collection Time: 11/10/20  9:35 AM  Result Value Ref Range   Heparin Unfractionated 0.18 (L) 0.30 - 0.70 IU/mL    Comment: (NOTE) If heparin results are below expected values, and patient dosage has  been confirmed, suggest follow up testing of antithrombin III levels. Performed at [facility], [place] [address] [place],  [address]   CBC     Status: Abnormal   Collection Time: 11/10/20 10:55 AM  Result Value Ref Range   WBC 6.7 4.0 - 10.5 K/uL   RBC 3.63 (L) 4.22 - 5.81 MIL/uL   Hemoglobin 11.7 (L) 13.0 - 17.0 g/dL   HCT 35.9 (L) 39.0 - 52.0 %   MCV 98.9 80.0 - 100.0 fL   MCH 32.2 26.0 - 34.0 pg   MCHC 32.6 30.0 - 36.0 g/dL   RDW 13.4 11.5 -  15.5 %   Platelets 268 150 - 400 K/uL   nRBC 0.0 0.0 - 0.2 %    Comment: Performed at [facility], [place] [address],  [place], [place] [address]    DG Chest 2 View  Result Date: 11/09/2020 CLINICAL DATA:  Back pain and productive cough. EXAM: CHEST - 2 VIEW COMPARISON:  None. FINDINGS: Lungs are hyperexpanded. Soft tissue fullness noted in the right hilum. Left lung unremarkable. No pleural effusion. The visualized bony structures of the thorax show no acute abnormality. IMPRESSION: Soft tissue fullness in the right hilum. CT chest with contrast recommended to further evaluate as lymphadenopathy/neoplasm a concern. Electronically Signed   By: Misty Stanley M.D.   On: 11/09/2020 20:21   CT Chest W Contrast  Addendum Date: 11/10/2020   ADDENDUM REPORT: 11/10/2020 01:06 ADDENDUM: Additional small centrally necrotic appearing deposits appear to be located either along or adjacent the right hemidiaphragm, either within the lung base or subphrenic space (3/150). Addendum was called by telephone at the time of interpretation on 11/10/2020 at 1:06 am to provider GARRETT GREEN , who verbally acknowledged these results. Electronically Signed   By: Lovena Le M.D.   On: 11/10/2020 01:06   Result Date: 11/10/2020 CLINICAL DATA:  Abnormal radiograph, concern for mass EXAM: CT CHEST WITH CONTRAST TECHNIQUE: Multidetector CT imaging of the chest was performed during intravenous contrast administration. CONTRAST:  87mL OMNIPAQUE IOHEXOL 300 MG/ML  SOLN COMPARISON:  Radiograph 11/09/2020 FINDINGS: Cardiovascular: Normal cardiac size. Intermediate attenuation pericardial effusion, possibly malignant given the bulky mediastinal and hilar nodal disease. The aortic root is suboptimally assessed given cardiac pulsation artifact. The aorta is normal caliber. Aberrant right subclavian artery with small diverticulum of Kommerell. Proximal great vessels are otherwise unremarkable. Central pulmonary arteries are top-normal caliber. No large central or lobar filling defects within limitations of this non tailored examination of the pulmonary arteries.  Reflux of contrast into the IVC and hepatic veins. Hypoattenuating filling defect is seen extending from the within the left brachiocephalic vein 1/06-26, clear this reflects direct extension or merely thrombus in the setting of compression by the bulky disease Mediastinum/Nodes: Extensive mediastinal, hilar and supraclavicular adenopathy throughout the chest bilaterally. Some central hypoattenuation concerning necrotic adenopathy. Largest supraclavicular nodal deposit noted on the right measuring up to 3.1 x 3 cm in size (3/23). Paratracheal node measuring 3.3 x 1.9 cm (3/70). Right hilar deposit measuring 3.3 x 3.2 cm (3/90). Conglomerate adenopathy noted in the AP window and prevascular space as well. No acute abnormality of the trachea or esophagus. Hypoattenuating nodule in the thyroid isthmus measuring up to 1.7 cm in size. Lungs/Pleura: Centrally hypoattenuating lobular mass lesion seen in the posterosuperior segment right lower lobe measuring approximately 2.3 x 2.4 cm in size (4/93). Additional smaller nodules are seen throughout both lungs. Findings are on a background of diffuse centrilobular and paraseptal emphysematous changes as well as bronchitic features with airways thickening and scattered secretions. Regions of bandlike scarring and/or atelectasis are noted throughout both lungs. Trace right effusion with adjacent atelectasis. No left effusion. Upper Abdomen: Heterogeneous, largely hypoattenuating focus in the tail of the pancreas measuring 2.5 x 2.1 cm (3/21). Indeterminate hyperattenuating focus in the posterior right lobe liver measuring 1.3 x 1.3 cm (3/182), possible flash filling hemangioma though should be viewed with conspicuity given additional findings. No other acute abnormality in the upper abdomen. Musculoskeletal: Lucent lesion seen involving the T9 anterior inferior vertebral body with some mild soft tissue extension. Additional lucent lesion in  the posterior left ninth rib (3/114)  some ill defined sclerotic change in the right seventh rib laterally. No other consider acute or conspicuous osseous lesions. IMPRESSION: 1. 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 Differential considerations could include a primary pulmonary malignancy with widespread metastases versus metastatic disease from a partially visualized pancreatic tail lesion. Alternatively, atypical infection such as mycobacterial/tuberculosis should be excluded as well. 2. Trace right pleural effusion. 3. Reflux of contrast in the hepatic veins and IVC could reflect some elevated right heart pressures/right-sided dysfunction. Correlate with echocardiogram particularly given the presence pericardial effusion. 4. Hypoattenuating filling defect is seen in the left brachiocephalic vein, unclear if this could reflect extension from the nodal disease or merely bland thrombus with compression of the left brachiocephalic vein as it passes through the extensive prevascular deposits. No large central or lobar filling defects are seen within the pulmonary arteries though evaluation limited on this non tailored examination. 5. Lucent lesion involving the T9 anterior inferior vertebral body with some mild soft tissue extension. Additional lucent lesion in the posterior left ninth rib. Additional ill defined sclerotic change in the right seventh rib laterally. 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 6. Indeterminate hyperattenuating focus in the posterior right lobe liver measuring 1.3 x 1.3 cm, possible flash filling hemangioma though should be viewed with conspicuity given additional findings. 7. Hypoattenuating 1.7 cm nodule in the thyroid isthmus,  Recommend thyroid US (ref: J Am Coll Radiol. 2015 Feb;12(2): 143-50). 8. Aberrant right subclavian artery. 9. Emphysema (ICD10-J43.9). These results were called by telephone at the time of interpretation on 11/10/2020 at 12:49 am to provider PA McDonald, who verbally acknowledged these results. Electronically Signed: By: Lovena Le M.D. On: 11/10/2020 00:51   CT ABDOMEN PELVIS W CONTRAST  Result Date: 11/10/2020 CLINICAL DATA:  Metastatic disease evaluation EXAM: CT ABDOMEN AND PELVIS WITH CONTRAST TECHNIQUE: Multidetector CT imaging of the abdomen and pelvis was performed using the standard protocol following bolus administration of intravenous contrast. CONTRAST:  13mL OMNIPAQUE IOHEXOL 300 MG/ML  SOLN COMPARISON:  Same-day CT chest, 11/10/2020 FINDINGS: Lower chest: Small right pleural effusion, increased compared to prior examination. Large pericardial effusion, similar to prior. Hepatobiliary: No solid liver abnormality is seen. Periportal edema. Pericholecystic fluid, nonspecific in the setting of ascites. No gallstones, gallbladder wall thickening, or biliary dilatation. Pancreas: Unremarkable. No pancreatic ductal dilatation or surrounding inflammatory changes. Spleen: Normal in size without significant abnormality. Adrenals/Urinary Tract: Adrenal glands are unremarkable. Kidneys are normal, without renal calculi, solid lesion, or hydronephrosis. Bladder is unremarkable. Stomach/Bowel: Stomach is within normal limits. Appendix appears normal. No evidence of bowel wall thickening, distention, or inflammatory changes. Vascular/Lymphatic: Aortic atherosclerosis. No enlarged abdominal or pelvic lymph nodes. Reproductive: No mass or other significant abnormality. Other: No abdominal wall hernia or abnormality. Small volume ascites throughout the abdomen and pelvis. Musculoskeletal: Small lucent and rim sclerotic lesions noted in the pelvis, for example in the right ilium (series 3, image 54, 57). IMPRESSION:  1. No CT evidence of lymphadenopathy soft tissue metastatic disease in the abdomen or pelvis. 2. Small lucent and rim sclerotic lesions noted in the bony pelvis, suspicious for subtle osseous metastatic disease given the presence of a presumed metastatic lesion of the T9 vertebral body. PET-CT is more sensitive for the detection of subtle metabolically  active osseous metastatic disease. 3. Small volume ascites throughout the abdomen and pelvis, nonspecific. 4. Periportal edema and pericholecystic fluid, nonspecific in the setting of ascites. 5. Small right pleural effusion, increased compared to prior examination. 6. Large pericardial effusion, similar to prior examination. Aortic Atherosclerosis (ICD10-I70.0). Electronically Signed   By: Eddie Candle M.D.   On: 11/10/2020 12:46    Review of Systems  Constitutional: Positive for activity change, appetite change and fatigue. Negative for chills and fever.  HENT: Negative.   Eyes: Negative.   Respiratory: Positive for cough and shortness of breath.        Hemoptysis today  Cardiovascular: Positive for chest pain. Negative for palpitations and leg swelling.  Gastrointestinal: Negative.   Endocrine: Negative.   Genitourinary: Negative.   Musculoskeletal: Positive for back pain.  Allergic/Immunologic: Negative.   Neurological: Negative for dizziness, syncope and headaches.  Hematological: Negative.   Psychiatric/Behavioral: Negative.    Blood pressure 112/79, pulse (!) 127, temperature 98.4 F (36.9 C), temperature source Oral, resp. rate 18, height 5\' 11"  (1.803 m), weight 61.7 kg, SpO2 92 %. Physical Exam Constitutional:      Appearance: Normal appearance. He is normal weight. He is ill-appearing.  HENT:     Head: Normocephalic and atraumatic.  Eyes:     Extraocular Movements: Extraocular movements intact.     Conjunctiva/sclera: Conjunctivae normal.     Pupils: Pupils are equal, round, and reactive to light.  Neck:     Vascular: No  carotid bruit.  Cardiovascular:     Rate and Rhythm: Normal rate and regular rhythm.     Heart sounds: No murmur heard. No friction rub. No gallop.      Comments: Distant heart sounds Pulmonary:     Effort: Pulmonary effort is normal.     Comments: Decreased breath sounds in bases Abdominal:     General: Abdomen is flat. Bowel sounds are normal. There is no distension.     Palpations: Abdomen is soft. There is no mass.     Tenderness: There is no abdominal tenderness.  Musculoskeletal:        General: No swelling. Normal range of motion.     Cervical back: Normal range of motion and neck supple.  Lymphadenopathy:     Cervical: Cervical adenopathy present.  Skin:    General: Skin is warm and dry.  Neurological:     General: No focal deficit present.     Mental Status: He is alert and oriented to person, place, and time.  Psychiatric:        Mood and Affect: Mood normal.        Behavior: Behavior normal.     Assessment/Plan:  This 60 year old gentleman has a mass in the superior segment of the RLL with extensive mediastinal, hilar and supraclavicular adenopathy, bone lesions in T9 vertebral body and left 9th rib and possibly right 7th rib, with a large pericardial effusion and signs of tamponade. I suspect this is metastatic cancer. He has borderline hemodynamics and will require emergent subxyphoid pericardial window for drainage of the effusion and to obtain fluid and tissue for diagnosis. I discussed the operative procedure with the patient including alternatives, benefits and risks; including but not limited to bleeding, blood transfusion, infection, injury to the heart, recurrent effusion,  organ dysfunction, and death.  Cindra Eves understands and agrees to proceed.   Gaye Pollack 11/10/2020, 6:02 PM

## 2020-11-10 NOTE — Consult Note (Signed)
Cardiology Consultation:   Patient ID: Brian Leonard MRN: [id number]; DOB: [birth year]  Admit date: 11/09/2020 Date of Consult: 11/10/2020  PCP:  Patient, No Pcp Per   [place]  Cardiologist:  No primary care provider on file. New Advanced Practice Provider:  No care team member to display Electrophysiologist:  None        Patient Profile:   Brian Leonard is a 60 y.o. male with a hx of smoking who is being seen today for the evaluation of pericardial effusion at the request of Dr.Hall.  History of Present Illness:   Brian Leonard presents today with multiple complaints including pleuritic chest pain, several sites of skeletal pain, cough, dyspnea and orthopnea. He has not had fever, chills, hemoptysis or known sick contacts. He denies leg swelling or pain.  He also denies weight loss and night sweats.  He complains of his right hip sometimes "locking up" due to pain when he tries to stand up from a sitting position.  He has been sick for about a month, with steadily worsening intensity of the pain.  He does not have a history of exertional chest pain, syncope, palpitations, intermittent claudication, stroke or other focal neurological events, lower extremity edema.  CT shows a right lower lobe pleural based lung mass with necrotic change, numerous micronodules scattered through both lungs, widespread necrotic lymph nodes throughout the chest, bony lesions in T9 vertebra and bilateral ribs, possible pancreatic tail lesion, as well as a pericardial effusion.  There is also a question of thrombus in the left brachiocephalic vein, for which she was started on intravenous heparin.  Echocardiogram being performed as we speak in the emergency room shows a moderate to large pericardial effusion with clear-cut evidence of pericardial tamponade including plethoric IVC, right atrial and right ventricular diastolic collapse, increased respiratory variation in mitral flow.  He is  tachypneic and mildly tachycardic with a narrow pulse pressure (heart rate 118, blood pressure 101/82).  COVID-19 negative.  He is married but reports that his closest familial relationship is with his sister.   Past Medical History:  Diagnosis Date  . Asthma     Past Surgical History:  Procedure Laterality Date  . CERVICAL SPINE SURGERY       Home Medications:  Prior to Admission medications   Medication Sig Start Date End Date Taking? Authorizing Provider  acetaminophen (TYLENOL) 500 MG tablet Take 1,000 mg by mouth every 6 (six) hours as needed for headache or moderate pain.   Yes [provider]  ibuprofen (ADVIL) 200 MG tablet Take 400 mg by mouth every 6 (six) hours as needed for headache or moderate pain.   Yes [provider]  Multiple Vitamin (MULTIVITAMIN WITH MINERALS) TABS tablet Take 1 tablet by mouth daily.   Yes [provider]    Inpatient Medications: Scheduled Meds: . iohexol      . levalbuterol  0.63 mg Nebulization Q6H  . nicotine  14 mg Transdermal Daily   Continuous Infusions: . sodium chloride 100 mL/hr at 11/10/20 1655  . heparin 1,200 Units/hr (11/10/20 1109)   PRN Meds: acetaminophen **OR** acetaminophen, bisacodyl, fentaNYL (SUBLIMAZE) injection, guaiFENesin, HYDROcodone-acetaminophen, metoprolol tartrate, ondansetron **OR** ondansetron (ZOFRAN) IV, senna-docusate  Allergies:    Allergies  Allergen Reactions  . Iodine Anaphylaxis  . Shellfish Allergy Anaphylaxis  . Other Hives and Swelling    Reaction to unknown oral asthma medication (pt has taken Theo Dur in the past - 10 yrs ago with no reaction)  Social History:   Social History   Socioeconomic History  . Marital status: Married    Spouse name: Not on file  . Number of children: Not on file  . Years of education: Not on file  . Highest education level: Not on file  Occupational History  . Not on file  Tobacco Use  . Smoking status: Current Every Day  Smoker  . Smokeless tobacco: Never Used  Substance and Sexual Activity  . Alcohol use: Yes  . Drug use: Yes    Types: Marijuana  . Sexual activity: Not on file  Other Topics Concern  . Not on file  Social History Narrative  . Not on file   Social Determinants of Health   Financial Resource Strain: Not on file  Food Insecurity: Not on file  Transportation Needs: Not on file  Physical Activity: Not on file  Stress: Not on file  Social Connections: Not on file  Intimate Partner Violence: Not on file    Family History:   History reviewed. No pertinent family history.   ROS:  Please see the history of present illness.   All other ROS reviewed and negative.     Physical Exam/Data:   Vitals:   11/10/20 1130 11/10/20 1200 11/10/20 1547 11/10/20 1625  BP: 107/83 106/85 98/87 112/79  Pulse: (!) 108 (!) 110 (!) 136 (!) 127  Resp: 16 16 16 18   Temp:      TempSrc:      SpO2: 93% 90% 94% 92%  Weight:      Height:        Intake/Output Summary (Last 24 hours) at 11/10/2020 1717 Last data filed at 11/10/2020 1507 Gross per 24 hour  Intake 2000 ml  Output --  Net 2000 ml   Last 3 Weights 11/10/2020 08/26/2015  Weight (lbs) 136 lb 135 lb  Weight (kg) 61.689 kg 61.236 kg     Body mass index is 18.97 kg/m.  General:  Well nourished, well developed, in no acute distress.  He is very lean. HEENT: normal Lymph: no adenopathy Neck: JVD to the angle of the jaw Endocrine:  No thryomegaly Vascular: No carotid bruits; FA pulses 2+ bilaterally without bruits  Cardiac:  normal S1, S2; RRR; no murmur and no pericardial rubs Lungs:  clear to auscultation bilaterally, no wheezing, rhonchi or rales.  Questionable faint pleural rub right lower lung posteriorly (he avoids taking deep breaths). Abd: soft, nontender, no hepatomegaly  Ext: no edema Musculoskeletal:  No deformities, BUE and BLE strength normal and equal Skin: warm and dry  Neuro:  CNs 2-12 intact, no focal abnormalities  noted Psych:  Normal affect  Could not elicit pulses paradoxus clinically.  EKG:  The EKG was personally reviewed and demonstrates: Sinus tachycardia with subtle ST segment elevation the inferior leads as well as V3-V6 Telemetry:  Telemetry was personally reviewed and demonstrates: Sinus tachycardia  Relevant CV Studies: Bedside review of echocardiogram shows a moderate to large circumferential pericardial effusion with multiple signs of pericardial tamponade.  Left and right ventricular systolic function, no major valvular abnormalities are seen.  There is prominent respiratory flow variation across the mitral and tricuspid valves, but there is no variation in LV outflow tract velocities, consistent with the absence of pulses paradoxus on physical exam.  Laboratory Data:  High Sensitivity Troponin:   Recent Labs  Lab 11/10/20 0114 11/10/20 0304  TROPONINIHS 16 16     Chemistry Recent Labs  Lab 11/09/20 2242 11/10/20 0408  NA 138  137  K 3.6 3.5  CL 107 106  CO2 21* 21*  GLUCOSE 122* 174*  BUN 14 13  CREATININE 0.79 0.80  CALCIUM 9.0 8.9  GFRNONAA >60 >60  ANIONGAP 10 10    Recent Labs  Lab 11/09/20 2242  PROT 6.1*  ALBUMIN 3.2*  AST 20  ALT 18  ALKPHOS 93  BILITOT 0.8   Hematology Recent Labs  Lab 11/09/20 2242 11/10/20 1055  WBC 8.1 6.7  RBC 3.68* 3.63*  HGB 12.2* 11.7*  HCT 35.3* 35.9*  MCV 95.9 98.9  MCH 33.2 32.2  MCHC 34.6 32.6  RDW 13.4 13.4  PLT 288 268   BNPNo results for input(s): BNP, PROBNP in the last 168 hours.  DDimer No results for input(s): DDIMER in the last 168 hours.   Radiology/Studies:  DG Chest 2 View  Result Date: 11/09/2020 CLINICAL DATA:  Back pain and productive cough. EXAM: CHEST - 2 VIEW COMPARISON:  None. FINDINGS: Lungs are hyperexpanded. Soft tissue fullness noted in the right hilum. Left lung unremarkable. No pleural effusion. The visualized bony structures of the thorax show no acute abnormality. IMPRESSION: Soft  tissue fullness in the right hilum. CT chest with contrast recommended to further evaluate as lymphadenopathy/neoplasm a concern. Electronically Signed   By: Misty Stanley M.D.   On: 11/09/2020 20:21   CT Chest W Contrast  Addendum Date: 11/10/2020   ADDENDUM REPORT: 11/10/2020 01:06 ADDENDUM: Additional small centrally necrotic appearing deposits appear to be located either along or adjacent the right hemidiaphragm, either within the lung base or subphrenic space (3/150). Addendum was called by telephone at the time of interpretation on 11/10/2020 at 1:06 am to provider GARRETT GREEN , who verbally acknowledged these results. Electronically Signed   By: Lovena Le M.D.   On: 11/10/2020 01:06   Result Date: 11/10/2020 CLINICAL DATA:  Abnormal radiograph, concern for mass EXAM: CT CHEST WITH CONTRAST TECHNIQUE: Multidetector CT imaging of the chest was performed during intravenous contrast administration. CONTRAST:  46mL OMNIPAQUE IOHEXOL 300 MG/ML  SOLN COMPARISON:  Radiograph 11/09/2020 FINDINGS: Cardiovascular: Normal cardiac size. Intermediate attenuation pericardial effusion, possibly malignant given the bulky mediastinal and hilar nodal disease. The aortic root is suboptimally assessed given cardiac pulsation artifact. The aorta is normal caliber. Aberrant right subclavian artery with small diverticulum of Kommerell. Proximal great vessels are otherwise unremarkable. Central pulmonary arteries are top-normal caliber. No large central or lobar filling defects within limitations of this non tailored examination of the pulmonary arteries. Reflux of contrast into the IVC and hepatic veins. Hypoattenuating filling defect is seen extending from the within the left brachiocephalic vein 4/09-81, clear this reflects direct extension or merely thrombus in the setting of compression by the bulky disease Mediastinum/Nodes: Extensive mediastinal, hilar and supraclavicular adenopathy throughout the chest bilaterally.  Some central hypoattenuation concerning necrotic adenopathy. Largest supraclavicular nodal deposit noted on the right measuring up to 3.1 x 3 cm in size (3/23). Paratracheal node measuring 3.3 x 1.9 cm (3/70). Right hilar deposit measuring 3.3 x 3.2 cm (3/90). Conglomerate adenopathy noted in the AP window and prevascular space as well. No acute abnormality of the trachea or esophagus. Hypoattenuating nodule in the thyroid isthmus measuring up to 1.7 cm in size. Lungs/Pleura: Centrally hypoattenuating lobular mass lesion seen in the posterosuperior segment right lower lobe measuring approximately 2.3 x 2.4 cm in size (4/93). Additional smaller nodules are seen throughout both lungs. Findings are on a background of diffuse centrilobular and paraseptal emphysematous changes as well as bronchitic  features with airways thickening and scattered secretions. Regions of bandlike scarring and/or atelectasis are noted throughout both lungs. Trace right effusion with adjacent atelectasis. No left effusion. Upper Abdomen: Heterogeneous, largely hypoattenuating focus in the tail of the pancreas measuring 2.5 x 2.1 cm (3/21). Indeterminate hyperattenuating focus in the posterior right lobe liver measuring 1.3 x 1.3 cm (3/182), possible flash filling hemangioma though should be viewed with conspicuity given additional findings. No other acute abnormality in the upper abdomen. Musculoskeletal: Lucent lesion seen involving the T9 anterior inferior vertebral body with some mild soft tissue extension. Additional lucent lesion in the posterior left ninth rib (3/114) some ill defined sclerotic change in the right seventh rib laterally. No other consider acute or conspicuous osseous lesions. IMPRESSION: 1. Extensive centrally necrotic lymphadenopathy seen in the supraclavicular chains, mediastinum and hila, right greater left with a necrotic appearing mass in the superior segment right lower lobe, numerous scattered solid micro nodules  throughout both lungs, and intermediate attenuation likely malignant pericardial effusion. Differential considerations could include a primary pulmonary malignancy with widespread metastases versus metastatic disease from a partially visualized pancreatic tail lesion. Alternatively, atypical infection such as mycobacterial/tuberculosis should be excluded as well. 2. Trace right pleural effusion. 3. Reflux of contrast in the hepatic veins and IVC could reflect some elevated right heart pressures/right-sided dysfunction. Correlate with echocardiogram particularly given the presence pericardial effusion. 4. Hypoattenuating filling defect is seen in the left brachiocephalic vein, unclear if this could reflect extension from the nodal disease or merely bland thrombus with compression of the left brachiocephalic vein as it passes through the extensive prevascular deposits. No large central or lobar filling defects are seen within the pulmonary arteries though evaluation limited on this non tailored examination. 5. Lucent lesion involving the T9 anterior inferior vertebral body with some mild soft tissue extension. Additional lucent lesion in the posterior left ninth rib. Additional ill defined sclerotic change in the right seventh rib laterally. 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 6. Indeterminate hyperattenuating focus in the posterior right lobe liver measuring 1.3 x 1.3 cm, possible flash filling hemangioma though should be viewed with conspicuity given additional findings. 7. Hypoattenuating 1.7 cm nodule in the thyroid isthmus, Recommend thyroid US (ref: J Am Coll Radiol. 2015 Feb;12(2): 143-50). 8. Aberrant right subclavian artery. 9. Emphysema (ICD10-J43.9). These results were called by telephone at the time of interpretation on 11/10/2020 at 12:49 am to provider PA McDonald, who verbally acknowledged these results. Electronically Signed: By: Lovena Le M.D. On: 11/10/2020 00:51   CT ABDOMEN  PELVIS W CONTRAST  Result Date: 11/10/2020 CLINICAL DATA:  Metastatic disease evaluation EXAM: CT ABDOMEN AND PELVIS WITH CONTRAST TECHNIQUE: Multidetector CT imaging of the abdomen and pelvis was performed using the standard protocol following bolus administration of intravenous contrast. CONTRAST:  169mL OMNIPAQUE IOHEXOL 300 MG/ML  SOLN COMPARISON:  Same-day CT chest, 11/10/2020 FINDINGS: Lower chest: Small right pleural effusion, increased compared to prior examination. Large pericardial effusion, similar to prior. Hepatobiliary: No solid liver abnormality is seen. Periportal edema. Pericholecystic fluid, nonspecific in the setting of ascites. No gallstones, gallbladder wall thickening, or biliary dilatation. Pancreas: Unremarkable. No pancreatic ductal dilatation or surrounding inflammatory changes. Spleen: Normal in size without significant abnormality. Adrenals/Urinary Tract: Adrenal glands are unremarkable. Kidneys are normal, without renal calculi, solid lesion, or hydronephrosis. Bladder is unremarkable. Stomach/Bowel: Stomach is within normal limits. Appendix appears normal. No evidence of bowel wall thickening, distention, or inflammatory changes. Vascular/Lymphatic: Aortic atherosclerosis. No enlarged abdominal or pelvic lymph nodes.  Reproductive: No mass or other significant abnormality. Other: No abdominal wall hernia or abnormality. Small volume ascites throughout the abdomen and pelvis. Musculoskeletal: Small lucent and rim sclerotic lesions noted in the pelvis, for example in the right ilium (series 3, image 54, 57). IMPRESSION: 1. No CT evidence of lymphadenopathy soft tissue metastatic disease in the abdomen or pelvis. 2. Small lucent and rim sclerotic lesions noted in the bony pelvis, suspicious for subtle osseous metastatic disease given the presence of a presumed metastatic lesion of the T9 vertebral body. PET-CT is more sensitive for the detection of subtle metabolically active osseous  metastatic disease. 3. Small volume ascites throughout the abdomen and pelvis, nonspecific. 4. Periportal edema and pericholecystic fluid, nonspecific in the setting of ascites. 5. Small right pleural effusion, increased compared to prior examination. 6. Large pericardial effusion, similar to prior examination. Aortic Atherosclerosis (ICD10-I70.0). Electronically Signed   By: Eddie Candle M.D.   On: 11/10/2020 12:46     Assessment and Plan:   1. Pericardial tamponade: The effusion is most likely due to widespread malignancy, most likely with a primary lung source.  The patient currently appears hemodynamically compensated, but has numerous features suggesting impending tamponade including tachycardia, narrow pulse pressure and has all the typical echo features of tamponade.  Does not yet have pulses paradoxus or evidence of any endorgan hypoperfusion, but these are likely imminent.  We will ask for consultation by the cardiothoracic surgery service, since we may obtain a more durable resolution of the effusion and get a diagnosis with a surgical sample, rather than pericardiocentesis. 2. Lung mass with extensive intrathoracic lymphadenopathy and probable bony metastases, and a smoker, almost certainly representing primary lung cancer. 3. Brachiocephalic venous thrombus: May represent an expression of hypercoagulable state in the setting of neoplasm but also likely due to direct compression of venous drainage due to the bulky lymphadenopathy.  The superior vena cava itself does not appear to be compressed or thrombosed.  Need to be cautious with anticoagulation in the setting of what may be a hemorrhagic pericardial effusion.  Heparin has already been initiated.   For questions or updates, please contact [facility] Please consult [email or web address] for contact info under    Signed, Sanda Klein, MD  11/10/2020 5:17 PM

## 2020-11-10 NOTE — ED Notes (Signed)
Pt to CT

## 2020-11-10 NOTE — Progress Notes (Signed)
Discussed new findings of pericardial tamponade with cardiology.  Patient will be transferred to higher level of care.  PCCM contacted for transfer to the ICU.

## 2020-11-10 NOTE — Anesthesia Procedure Notes (Signed)
Arterial Line Insertion Performed by: Clearnce Sorrel, CRNA  Patient location: Pre-op. Preanesthetic checklist: patient identified, IV checked, site marked, risks and benefits discussed, surgical consent, monitors and equipment checked, pre-op evaluation, timeout performed and anesthesia consent Lidocaine 1% used for infiltration Right, radial was placed Catheter size: 20 Fr Hand hygiene performed  and maximum sterile barriers used   Attempts: 1 Procedure performed without using ultrasound guided technique. Following insertion, dressing applied. Post procedure assessment: normal and unchanged

## 2020-11-10 NOTE — Progress Notes (Signed)
  Echocardiogram 2D Echocardiogram has been performed.  Brian Leonard 11/10/2020, 5:58 PM

## 2020-11-11 ENCOUNTER — Encounter (HOSPITAL_COMMUNITY): Payer: Self-pay | Admitting: Surgery

## 2020-11-11 ENCOUNTER — Inpatient Hospital Stay (HOSPITAL_COMMUNITY): Payer: Medicaid Other

## 2020-11-11 DIAGNOSIS — K869 Disease of pancreas, unspecified: Secondary | ICD-10-CM

## 2020-11-11 DIAGNOSIS — M899 Disorder of bone, unspecified: Secondary | ICD-10-CM

## 2020-11-11 LAB — CBC
HCT: 29.8 % — ABNORMAL LOW (ref 39.0–52.0)
Hemoglobin: 10.7 g/dL — ABNORMAL LOW (ref 13.0–17.0)
MCH: 33.5 pg (ref 26.0–34.0)
MCHC: 35.9 g/dL (ref 30.0–36.0)
MCV: 93.4 fL (ref 80.0–100.0)
Platelets: 233 10*3/uL (ref 150–400)
RBC: 3.19 MIL/uL — ABNORMAL LOW (ref 4.22–5.81)
RDW: 13.2 % (ref 11.5–15.5)
WBC: 9.7 10*3/uL (ref 4.0–10.5)
nRBC: 0 % (ref 0.0–0.2)

## 2020-11-11 LAB — BASIC METABOLIC PANEL
Anion gap: 9 (ref 5–15)
BUN: 9 mg/dL (ref 6–20)
CO2: 20 mmol/L — ABNORMAL LOW (ref 22–32)
Calcium: 8.9 mg/dL (ref 8.9–10.3)
Chloride: 104 mmol/L (ref 98–111)
Creatinine, Ser: 0.68 mg/dL (ref 0.61–1.24)
GFR, Estimated: >60 mL/min (ref >60)
Glucose, Bld: 127 mg/dL — ABNORMAL HIGH (ref 70–99)
Potassium: 4.1 mmol/L (ref 3.5–5.1)
Sodium: 133 mmol/L — ABNORMAL LOW (ref 135–145)

## 2020-11-11 LAB — HEMOGLOBIN A1C
Hgb A1c MFr Bld: 5.9 % — ABNORMAL HIGH (ref 4.8–5.6)
Mean Plasma Glucose: 122.63 mg/dL

## 2020-11-11 LAB — GLUCOSE, CAPILLARY
Glucose-Capillary: 124 mg/dL — ABNORMAL HIGH (ref 70–99)
Glucose-Capillary: 113 mg/dL — ABNORMAL HIGH (ref 70–99)
Glucose-Capillary: 109 mg/dL — ABNORMAL HIGH (ref 70–99)
Glucose-Capillary: 112 mg/dL — ABNORMAL HIGH (ref 70–99)

## 2020-11-11 LAB — TSH: TSH: 0.672 u[IU]/mL (ref 0.350–4.500)

## 2020-11-11 LAB — MRSA PCR SCREENING: MRSA by PCR: NEGATIVE

## 2020-11-11 MED ORDER — ENSURE ENLIVE PO LIQD
237.0000 mL | Freq: Two times a day (BID) | ORAL | Status: DC
  Administered 2020-11-11 (×2): 237 mL via ORAL

## 2020-11-11 MED ORDER — CEFAZOLIN SODIUM-DEXTROSE 2-4 GM/100ML-% IV SOLN
2.0000 g | Freq: Three times a day (TID) | INTRAVENOUS | Status: AC
  Administered 2020-11-11 (×2): 2 g via INTRAVENOUS
  Filled 2020-11-11 (×2): qty 100

## 2020-11-11 MED ORDER — DIPHENHYDRAMINE HCL 25 MG PO CAPS
25.0000 mg | ORAL_CAPSULE | Freq: Every evening | ORAL | Status: DC | PRN
  Administered 2020-11-11 – 2020-11-15 (×5): 25 mg via ORAL
  Filled 2020-11-11 (×5): qty 1

## 2020-11-11 MED ORDER — LEVALBUTEROL TARTRATE 45 MCG/ACT IN AERO
2.0000 | INHALATION_SPRAY | Freq: Four times a day (QID) | RESPIRATORY_TRACT | Status: DC
  Administered 2020-11-11 – 2020-11-12 (×4): 2 via RESPIRATORY_TRACT
  Filled 2020-11-11: qty 15

## 2020-11-11 MED ORDER — TRAMADOL HCL 50 MG PO TABS
50.0000 mg | ORAL_TABLET | Freq: Four times a day (QID) | ORAL | Status: DC | PRN
  Administered 2020-11-13 – 2020-11-19 (×3): 100 mg via ORAL
  Filled 2020-11-11 (×3): qty 2

## 2020-11-11 MED ORDER — GUAIFENESIN 100 MG/5ML PO SOLN
10.0000 mL | ORAL | Status: DC | PRN
  Administered 2020-11-11 (×3): 200 mg via ORAL
  Filled 2020-11-11 (×2): qty 5
  Filled 2020-11-11: qty 10
  Filled 2020-11-11 (×2): qty 5

## 2020-11-11 MED ORDER — CHLORHEXIDINE GLUCONATE CLOTH 2 % EX PADS
6.0000 | MEDICATED_PAD | Freq: Every day | CUTANEOUS | Status: DC
  Administered 2020-11-13: 6 via TOPICAL

## 2020-11-11 MED ORDER — SODIUM CHLORIDE 0.9 % IV SOLN: INTRAVENOUS | Status: DC | PRN

## 2020-11-11 MED ORDER — ENOXAPARIN SODIUM 40 MG/0.4ML ~~LOC~~ SOLN
40.0000 mg | Freq: Every day | SUBCUTANEOUS | Status: DC
  Administered 2020-11-12 – 2020-11-17 (×6): 40 mg via SUBCUTANEOUS
  Filled 2020-11-11 (×6): qty 0.4

## 2020-11-11 MED ORDER — MORPHINE SULFATE (PF) 2 MG/ML IV SOLN
2.0000 mg | INTRAVENOUS | Status: DC | PRN
  Administered 2020-11-12 (×2): 2 mg via INTRAVENOUS
  Filled 2020-11-11 (×2): qty 1

## 2020-11-11 MED ORDER — ORAL CARE MOUTH RINSE
15.0000 mL | Freq: Two times a day (BID) | OROMUCOSAL | Status: DC
  Administered 2020-11-11 – 2020-11-16 (×10): 15 mL via OROMUCOSAL

## 2020-11-11 MED ORDER — SENNOSIDES-DOCUSATE SODIUM 8.6-50 MG PO TABS
1.0000 | ORAL_TABLET | Freq: Two times a day (BID) | ORAL | Status: DC
  Administered 2020-11-11 – 2020-11-19 (×11): 1 via ORAL
  Filled 2020-11-11 (×13): qty 1

## 2020-11-11 NOTE — Plan of Care (Signed)
  Problem: Clinical Measurements: Goal: Ability to maintain clinical measurements within normal limits will improve Outcome: Progressing Goal: Respiratory complications will improve Outcome: Progressing Goal: Cardiovascular complication will be avoided Outcome: Progressing   

## 2020-11-11 NOTE — Plan of Care (Signed)

## 2020-11-11 NOTE — Progress Notes (Addendum)
      [address]       [place] [address]             [phone]        1 Day Post-Op Procedure(s) (LRB): PERICARDIAL WINDOW (N/A)  Subjective: Patient with cough but states feels better than when he was in ED  Objective: Vital signs in last 24 hours: Temp:  [97.1 F (36.2 C)-98.8 F (37.1 C)] 98 F (36.7 C) (03/09 0342) Pulse Rate:  [90-136] 93 (03/09 0156) Cardiac Rhythm: Normal sinus rhythm (03/09 0659) Resp:  [14-26] 19 (03/09 0342) BP: (98-140)/(79-101) 137/88 (03/09 0156) SpO2:  [90 %-98 %] 91 % (03/09 0156) Arterial Line BP: (126-149)/(70-107) 131/72 (03/09 0100)   Current Weight  11/10/20 61.7 kg       Intake/Output from previous day: 03/08 0701 - 03/09 0700 In: 3032 [I.V.:2332; IV Piggyback:700] Out: 1143 [Urine:925; Blood:50; Chest Tube:168]   Physical Exam:  Cardiovascular: RRR Pulmonary: Clear to auscultation bilaterally Wounds: Dressing is clean and dry.    Lab Results: CBC: Recent Labs    11/10/20 1055 11/10/20 1952 11/11/20 0416  WBC 6.7  --  9.7  HGB 11.7* 8.5* 10.7*  HCT 35.9* 25.0* 29.8*  PLT 268  --  233   BMET:  Recent Labs    11/10/20 0408 11/10/20 1952 11/11/20 0416  NA 137 136 133*  K 3.5 4.0 4.1  CL 106  --  104  CO2 21*  --  20*  GLUCOSE 174*  --  127*  BUN 13  --  9  CREATININE 0.80  --  0.68  CALCIUM 8.9  --  8.9    PT/INR:  Lab Results  Component Value Date   INR 0.98 11/08/2015   ABG:  INR: Will add last result for INR, ABG once components are confirmed Will add last 4 CBG results once components are confirmed  Assessment/Plan:  1. CV - SR with HR in the 90's. Pericardial drain with 168 since surgery. Chest tube is to suction. CXR this am not ordered so will order. Pericardial effusion likely related to malignancy 2.  Pulmonary - Heterogeneous, largely hypoattenuating focus in the tail of the pancreas measuring 2.5 x 2.1 cm, extensive centrally necrotic lymphadenopathy, emphysema,  necrotic appearing mass in the superior segment right lower Lobe.  Encourage incentive spirometer 3.  Anemia-H and H this am  Increased to 10.7 and 29.8 4. Suspected left brachiocephalic vein thrombus 5. Remove foley and a line, decrease IVF  Donielle M ZimmermanPA-C 11/11/2020,7:34 AM   Chart reviewed, patient examined, agree with above. Coughing up some bloody secretions today. Pericardial tube output low. Keep tube in until minimal drainage. Would hold off on anticoagulation with bloody drainage. Low dose lovenox is ok. followup on cytology and path.

## 2020-11-11 NOTE — TOC Progression Note (Signed)
Transition of Care [facility]) - Progression Note    Patient Details  Name: Brian Leonard MRN: [id number] Date of Birth: 01-03-1961  [facility]) CM/SW Contact  Zenon Mayo, RN Phone Number: 11/11/2020, 3:04 PM  Clinical Narrative:    NCM spoke with patient, he states he lives with his sister and her spouse.  He does not have a PCP, would like to have apt at the [facility] clinic, Percell Locus the Secretary will schedule the apt for patient.  He will also need assistance with medications at discharge, NCM will assist with [facility] and ask MD to send scripts to [place].  He states he will have transportation at discharge.         Expected Discharge Plan and Services                                                 Social Determinants of Health (SDOH) Interventions    Readmission Risk Interventions No flowsheet data found.

## 2020-11-11 NOTE — Progress Notes (Addendum)
PROGRESS NOTE    Brian Leonard  DQQ:[id number] DOB: Jan 21, 1961 DOA: 11/09/2020 PCP: Patient, No Pcp Per   Brief Narrative: 60 year old with past medical history significant for tobacco abuse, presents complaining of back pain, chest pain and cough.  Patient reports some chronic back pain but worse over the last month.  Is also complaining of chest pain.  He report productive sputum.  He continues to smoke 7 cigarettes/day.  Evaluation in the ED patient was afebrile slight tachypneic, tachycardic heart rate in the 110s blood pressure stable.  CT chest concerning for extensive necrotic adenopathy, necrotic right lower lobe mass, numerous scattered micronodules throughout bilateral lungs, pericardial effusion, bone lesions involving T9 and bilateral ribs, suspected thrombus in the left brachiocephalic vein, partially visualized pancreatic tail lesion subone other finding.  2D echo showed pericardial effusion with signs of tamponade.  Patient was taken to the OR emergently on 3/8 for pericardial window and drainage.   Assessment & Plan:   Principal Problem:   Pericardial effusion Active Problems:   Brachiocephalic vein thrombosis (HCC)   Multiple pulmonary nodules   Lymphadenopathy, thoracic   Right lower lobe lung mass   Thyroid nodule   Liver lesion, right lobe   Lesion of pancreas   Lesion of thoracic vertebra   Rib lesion  1-Pericardial effusion, tamponade: -He was tachycardic, with low blood pressure.  2D echo confirmed pericardial effusion with tamponade. -CVTS was consulted, patient underwent pericardial window done 3/8, yielding bloody fluid. -Awaiting cytology.  -Continue with pericardial drain -Appreciate cardiology and cardiothoracic surgery.  2-Lung mass, necrotic lymphadenopathy, bone lesions, pancreatic tail lesion. -Patient presents with history of increased cough, pleuritic pain, back pain found to have on CT chest extensive necrotic lymphadenopathy, necrotic right  lower lobe mass, numerous bilateral nodules, lucent T9 lesion with soft tissue extension, left ninth rib and right 7 rib lesions, hyper attenuating focus in the right lobe of liver, partially visualized pancreatic tail lesion. -ConcernPrimary pulmonary malignancy with widespread metastasis, pancreatic primary or less likely atypical infection -Awaiting cytology from pericardial fluid biopsy -Depending  on resort may need oncology versus pulmonologist evaluation -AFB ordered and Quantiferon pending.   3-brachiocephalic thrombosis: Brian Leonard left brachiocephalic vein thrombus noted on CT -Was initially started on IV heparin.  Hold postop.  Plan to continue to hold anticoagulation postsurgery due to hemoptysis and bloody drainage from pericardial window  4-Thyroid nodule: He will need ultrasound for follow-up  5-tobacco abuse: Motivated to quit, continue with nicotine patch 6-mild hyponatremia: Monitor 7-Hemoptysis; related to lung mass, was on anticoagulation. Holding heparin.   Estimated body mass index is 18.97 kg/m as calculated from the following:   Height as of this encounter: 5\' 11"  (1.803 m).   Weight as of this encounter: 61.7 kg.   DVT prophylaxis: SCDs Code Status: Full code Family Communication: Care discussed with patient Disposition Plan:  Status is: Inpatient  Remains inpatient appropriate because:Ongoing diagnostic testing needed not appropriate for outpatient work up   Dispo:  Patient From: Home  Planned Disposition: Home  Medically stable for discharge: No          Consultants:   Cardiothoracic surgery  Cardiology  Procedures:   Pericardial window 3/8  Antimicrobials:    Subjective: Patient report 2 episode of hemoptysis, small amount,.  He is breathing a little bit better.  Objective: Vitals:   11/11/20 0030 11/11/20 0100 11/11/20 0156 11/11/20 0342  BP: 120/88 (!) 109/91 137/88   Pulse: 90 92 93   Resp: 14 (!) 24 18 19  Temp:   98.8 F  (37.1 C) 98 F (36.7 C)  TempSrc:   Oral Oral  SpO2: 98% 97% 91%   Weight:      Height:        Intake/Output Summary (Last 24 hours) at 11/11/2020 0729 Last data filed at 11/11/2020 0400 Gross per 24 hour  Intake 3032.04 ml  Output 1143 ml  Net 1889.04 ml   Filed Weights   11/10/20 0317  Weight: 61.7 kg    Examination:  General exam: Appears calm and comfortable  Respiratory system: Decreased breath sounds, no wheezing Cardiovascular system: S1 & S2 heard, RRR. ,  Subxiphoid drain in place bloody fluid Gastrointestinal system: Abdomen is nondistended, soft and nontender. No organomegaly or masses felt. Normal bowel sounds heard. Central nervous system: Alert and oriented.  Extremities: Symmetric 5 x 5 power.   Data Reviewed: I have personally reviewed following labs and imaging studies  CBC: Recent Labs  Lab 11/09/20 2242 11/10/20 1055 11/10/20 1952 11/11/20 0416  WBC 8.1 6.7  --  9.7  NEUTROABS 5.9  --   --   --   HGB 12.2* 11.7* 8.5* 10.7*  HCT 35.3* 35.9* 25.0* 29.8*  MCV 95.9 98.9  --  93.4  PLT 288 268  --  756   Basic Metabolic Panel: Recent Labs  Lab 11/09/20 2242 11/10/20 0408 11/10/20 1952 11/11/20 0416  NA 138 137 136 133*  K 3.6 3.5 4.0 4.1  CL 107 106  --  104  CO2 21* 21*  --  20*  GLUCOSE 122* 174*  --  127*  BUN 14 13  --  9  CREATININE 0.79 0.80  --  0.68  CALCIUM 9.0 8.9  --  8.9   GFR: Estimated Creatinine Clearance: 87.8 mL/min (by C-G formula based on SCr of 0.68 mg/dL). Liver Function Tests: Recent Labs  Lab 11/09/20 2242  AST 20  ALT 18  ALKPHOS 93  BILITOT 0.8  PROT 6.1*  ALBUMIN 3.2*   Recent Labs  Lab 11/10/20 0114  LIPASE 68*   No results for input(s): AMMONIA in the last 168 hours. Coagulation Profile: No results for input(s): INR, PROTIME in the last 168 hours. Cardiac Enzymes: No results for input(s): CKTOTAL, CKMB, CKMBINDEX, TROPONINI in the last 168 hours. BNP (last 3 results) No results for input(s):  PROBNP in the last 8760 hours. HbA1C: Recent Labs    11/11/20 0550  HGBA1C 5.9*   CBG: Recent Labs  Lab 11/10/20 2049  GLUCAP 143*   Lipid Profile: No results for input(s): CHOL, HDL, LDLCALC, TRIG, CHOLHDL, LDLDIRECT in the last 72 hours. Thyroid Function Tests: No results for input(s): TSH, T4TOTAL, FREET4, T3FREE, THYROIDAB in the last 72 hours. Anemia Panel: No results for input(s): VITAMINB12, FOLATE, FERRITIN, TIBC, IRON, RETICCTPCT in the last 72 hours. Sepsis Labs: No results for input(s): PROCALCITON, LATICACIDVEN in the last 168 hours.  Recent Results (from the past 240 hour(s))  Resp Panel by RT-PCR (Flu A&B, Covid) Nasopharyngeal Swab     Status: None   Collection Time: 11/10/20  1:55 AM   Specimen: Nasopharyngeal Swab; Nasopharyngeal(NP) swabs in vial transport medium  Result Value Ref Range Status   SARS Coronavirus 2 by RT PCR NEGATIVE NEGATIVE Final    Comment: (NOTE) SARS-CoV-2 target nucleic acids are NOT DETECTED.  The SARS-CoV-2 RNA is generally detectable in upper respiratory specimens during the acute phase of infection. The lowest concentration of SARS-CoV-2 viral copies this assay can detect is 138 copies/mL. A  negative result does not preclude SARS-Cov-2 infection and should not be used as the sole basis for treatment or other patient management decisions. A negative result may occur with  improper specimen collection/handling, submission of specimen other than nasopharyngeal swab, presence of viral mutation(s) within the areas targeted by this assay, and inadequate number of viral copies(<138 copies/mL). A negative result must be combined with clinical observations, patient history, and epidemiological information. The expected result is Negative.  Fact Sheet for Patients:  EntrepreneurPulse.com.au  Fact Sheet for Healthcare Providers:  IncredibleEmployment.be  This test is no t yet approved or cleared by  the [place] FDA and  has been authorized for detection and/or diagnosis of SARS-CoV-2 by FDA under an Emergency Use Authorization (EUA). This EUA will remain  in effect (meaning this test can be used) for the duration of the COVID-19 declaration under Section 564(b)(1) of the Act, 21 U.S.C.section 360bbb-3(b)(1), unless the authorization is terminated  or revoked sooner.       Influenza A by PCR NEGATIVE NEGATIVE Final   Influenza B by PCR NEGATIVE NEGATIVE Final    Comment: (NOTE) The Xpert Xpress SARS-CoV-2/FLU/RSV plus assay is intended as an aid in the diagnosis of influenza from Nasopharyngeal swab specimens and should not be used as a sole basis for treatment. Nasal washings and aspirates are unacceptable for Xpert Xpress SARS-CoV-2/FLU/RSV testing.  Fact Sheet for Patients: EntrepreneurPulse.com.au  Fact Sheet for Healthcare Providers: IncredibleEmployment.be  This test is not yet approved or cleared by the [place] FDA and has been authorized for detection and/or diagnosis of SARS-CoV-2 by FDA under an Emergency Use Authorization (EUA). This EUA will remain in effect (meaning this test can be used) for the duration of the COVID-19 declaration under Section 564(b)(1) of the Act, 21 U.S.C. section 360bbb-3(b)(1), unless the authorization is terminated or revoked.  Performed at [facility], [place] [address], [place], [place] [address]   MRSA PCR Screening     Status: None   Collection Time: 11/11/20  2:28 AM   Specimen: Nasal Mucosa; Nasopharyngeal  Result Value Ref Range Status   MRSA by PCR NEGATIVE NEGATIVE Final    Comment:        The GeneXpert MRSA Assay (FDA approved for NASAL specimens only), is one component of a comprehensive MRSA colonization surveillance program. It is not intended to diagnose MRSA infection nor to guide or monitor treatment for MRSA infections. Performed at [facility],  [place] [address], [place], [place] [address]          Radiology Studies: DG Chest 2 View  Result Date: 11/09/2020 CLINICAL DATA:  Back pain and productive cough. EXAM: CHEST - 2 VIEW COMPARISON:  None. FINDINGS: Lungs are hyperexpanded. Soft tissue fullness noted in the right hilum. Left lung unremarkable. No pleural effusion. The visualized bony structures of the thorax show no acute abnormality. IMPRESSION: Soft tissue fullness in the right hilum. CT chest with contrast recommended to further evaluate as lymphadenopathy/neoplasm a concern. Electronically Signed   By: Misty Stanley M.D.   On: 11/09/2020 20:21   CT Chest W Contrast  Addendum Date: 11/10/2020   ADDENDUM REPORT: 11/10/2020 01:06 ADDENDUM: Additional small centrally necrotic appearing deposits appear to be located either along or adjacent the right hemidiaphragm, either within the lung base or subphrenic space (3/150). Addendum was called by telephone at the time of interpretation on 11/10/2020 at 1:06 am to provider GARRETT GREEN , who verbally acknowledged these results. Electronically Signed  By: Lovena Le M.D.   On: 11/10/2020 01:06   Result Date: 11/10/2020 CLINICAL DATA:  Abnormal radiograph, concern for mass EXAM: CT CHEST WITH CONTRAST TECHNIQUE: Multidetector CT imaging of the chest was performed during intravenous contrast administration. CONTRAST:  32mL OMNIPAQUE IOHEXOL 300 MG/ML  SOLN COMPARISON:  Radiograph 11/09/2020 FINDINGS: Cardiovascular: Normal cardiac size. Intermediate attenuation pericardial effusion, possibly malignant given the bulky mediastinal and hilar nodal disease. The aortic root is suboptimally assessed given cardiac pulsation artifact. The aorta is normal caliber. Aberrant right subclavian artery with small diverticulum of Kommerell. Proximal great vessels are otherwise unremarkable. Central pulmonary arteries are top-normal caliber. No large central or lobar filling defects within limitations of this non  tailored examination of the pulmonary arteries. Reflux of contrast into the IVC and hepatic veins. Hypoattenuating filling defect is seen extending from the within the left brachiocephalic vein 1/88-41, clear this reflects direct extension or merely thrombus in the setting of compression by the bulky disease Mediastinum/Nodes: Extensive mediastinal, hilar and supraclavicular adenopathy throughout the chest bilaterally. Some central hypoattenuation concerning necrotic adenopathy. Largest supraclavicular nodal deposit noted on the right measuring up to 3.1 x 3 cm in size (3/23). Paratracheal node measuring 3.3 x 1.9 cm (3/70). Right hilar deposit measuring 3.3 x 3.2 cm (3/90). Conglomerate adenopathy noted in the AP window and prevascular space as well. No acute abnormality of the trachea or esophagus. Hypoattenuating nodule in the thyroid isthmus measuring up to 1.7 cm in size. Lungs/Pleura: Centrally hypoattenuating lobular mass lesion seen in the posterosuperior segment right lower lobe measuring approximately 2.3 x 2.4 cm in size (4/93). Additional smaller nodules are seen throughout both lungs. Findings are on a background of diffuse centrilobular and paraseptal emphysematous changes as well as bronchitic features with airways thickening and scattered secretions. Regions of bandlike scarring and/or atelectasis are noted throughout both lungs. Trace right effusion with adjacent atelectasis. No left effusion. Upper Abdomen: Heterogeneous, largely hypoattenuating focus in the tail of the pancreas measuring 2.5 x 2.1 cm (3/21). Indeterminate hyperattenuating focus in the posterior right lobe liver measuring 1.3 x 1.3 cm (3/182), possible flash filling hemangioma though should be viewed with conspicuity given additional findings. No other acute abnormality in the upper abdomen. Musculoskeletal: Lucent lesion seen involving the T9 anterior inferior vertebral body with some mild soft tissue extension. Additional lucent  lesion in the posterior left ninth rib (3/114) some ill defined sclerotic change in the right seventh rib laterally. No other consider acute or conspicuous osseous lesions. IMPRESSION: 1. 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 Differential considerations could include a primary pulmonary malignancy with widespread metastases versus metastatic disease from a partially visualized pancreatic tail lesion. Alternatively, atypical infection such as mycobacterial/tuberculosis should be excluded as well. 2. Trace right pleural effusion. 3. Reflux of contrast in the hepatic veins and IVC could reflect some elevated right heart pressures/right-sided dysfunction. Correlate with echocardiogram particularly given the presence pericardial effusion. 4. Hypoattenuating filling defect is seen in the left brachiocephalic vein, unclear if this could reflect extension from the nodal disease or merely bland thrombus with compression of the left brachiocephalic vein as it passes through the extensive prevascular deposits. No large central or lobar filling defects are seen within the pulmonary arteries though evaluation limited on this non tailored examination. 5. Lucent lesion  involving the T9 anterior inferior vertebral body with some mild soft tissue extension. Additional lucent lesion in the posterior left ninth rib. Additional ill defined sclerotic change in the right seventh rib laterally. 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 6. Indeterminate hyperattenuating focus in the posterior right lobe liver measuring 1.3 x 1.3 cm, possible flash filling hemangioma though should be viewed with conspicuity given additional findings. 7.  Hypoattenuating 1.7 cm nodule in the thyroid isthmus, Recommend thyroid US (ref: J Am Coll Radiol. 2015 Feb;12(2): 143-50). 8. Aberrant right subclavian artery. 9. Emphysema (ICD10-J43.9). These results were called by telephone at the time of interpretation on 11/10/2020 at 12:49 am to provider PA McDonald, who verbally acknowledged these results. Electronically Signed: By: Lovena Le M.D. On: 11/10/2020 00:51   CT ABDOMEN PELVIS W CONTRAST  Result Date: 11/10/2020 CLINICAL DATA:  Metastatic disease evaluation EXAM: CT ABDOMEN AND PELVIS WITH CONTRAST TECHNIQUE: Multidetector CT imaging of the abdomen and pelvis was performed using the standard protocol following bolus administration of intravenous contrast. CONTRAST:  120mL OMNIPAQUE IOHEXOL 300 MG/ML  SOLN COMPARISON:  Same-day CT chest, 11/10/2020 FINDINGS: Lower chest: Small right pleural effusion, increased compared to prior examination. Large pericardial effusion, similar to prior. Hepatobiliary: No solid liver abnormality is seen. Periportal edema. Pericholecystic fluid, nonspecific in the setting of ascites. No gallstones, gallbladder wall thickening, or biliary dilatation. Pancreas: Unremarkable. No pancreatic ductal dilatation or surrounding inflammatory changes. Spleen: Normal in size without significant abnormality. Adrenals/Urinary Tract: Adrenal glands are unremarkable. Kidneys are normal, without renal calculi, solid lesion, or hydronephrosis. Bladder is unremarkable. Stomach/Bowel: Stomach is within normal limits. Appendix appears normal. No evidence of bowel wall thickening, distention, or inflammatory changes. Vascular/Lymphatic: Aortic atherosclerosis. No enlarged abdominal or pelvic lymph nodes. Reproductive: No mass or other significant abnormality. Other: No abdominal wall hernia or abnormality. Small volume ascites throughout the abdomen and pelvis. Musculoskeletal: Small lucent and rim sclerotic lesions noted in the pelvis, for example in  the right ilium (series 3, image 54, 57). IMPRESSION: 1. No CT evidence of lymphadenopathy soft tissue metastatic disease in the abdomen or pelvis. 2. Small lucent and rim sclerotic lesions noted in the bony pelvis, suspicious for subtle osseous metastatic disease given the presence of a presumed metastatic lesion of the T9 vertebral body. PET-CT is more sensitive for the detection of subtle metabolically active osseous metastatic disease. 3. Small volume ascites throughout the abdomen and pelvis, nonspecific. 4. Periportal edema and pericholecystic fluid, nonspecific in the setting of ascites. 5. Small right pleural effusion, increased compared to prior examination. 6. Large pericardial effusion, similar to prior examination. Aortic Atherosclerosis (ICD10-I70.0). Electronically Signed   By: Eddie Candle M.D.   On: 11/10/2020 12:46   DG Chest Port 1 View  Result Date: 11/10/2020 CLINICAL DATA:  Pericardial effusion status post drainage. EXAM: PORTABLE CHEST 1 VIEW COMPARISON:  CT chest from same day.  Chest x-ray from yesterday. FINDINGS: New pericardial drain. Normal heart size. Normal pulmonary vascularity. Unchanged right paratracheal and hilar fullness related to underlying lymphadenopathy. Emphysematous changes again noted. New patchy airspace disease at the right lung base. No pleural effusion or pneumothorax. IMPRESSION: 1. New pericardial drain. 2. New patchy airspace disease at the right lung base, suspicious for aspiration. 3. Unchanged mediastinal and right hilar lymphadenopathy. Electronically Signed   By: Titus Dubin M.D.   On: 11/10/2020 20:54   ECHOCARDIOGRAM COMPLETE  Result Date: 11/10/2020    ECHOCARDIOGRAM REPORT   Patient Name:   CLAUDIA ALVIZO Date of Exam:  11/10/2020 Medical Rec #:  [id number]      Height:       71.0 in Accession #:    [id number]     Weight:       136.0 lb Date of Birth:  June 23, 1961      BSA:          1.790 m Patient Age:    62 years       BP:           112/79 mmHg  Patient Gender: M              HR:           110 bpm. Exam Location:  Inpatient Procedure: 2D Echo Indications:    pericardial effusion  History:        Patient has no prior history of Echocardiogram examinations.                 Signs/Symptoms:Shortness of Breath.  Sonographer:    Johny Chess Referring Phys: [id number] [place]  1. Left ventricular ejection fraction, by estimation, is 55 to 60%. The left ventricle has normal function. The left ventricle has no regional wall motion abnormalities. Indeterminate diastolic filling due to E-A fusion.  2. Right ventricular systolic function is normal. The right ventricular size is normal. The estimated right ventricular systolic pressure is 10.9 mmHg.  3. Moderate pericardial effusion. The pericardial effusion is circumferential. Findings are consistent with cardiac tamponade.  4. The mitral valve is normal in structure. No evidence of mitral valve regurgitation.  5. The aortic valve is tricuspid. Aortic valve regurgitation is not visualized. No aortic stenosis is present.  6. The inferior vena cava is dilated in size with <50% respiratory variability, suggesting right atrial pressure of 15 mmHg. FINDINGS  Left Ventricle: Left ventricular ejection fraction, by estimation, is 55 to 60%. The left ventricle has normal function. The left ventricle has no regional wall motion abnormalities. The left ventricular internal cavity size was normal in size. There is  no left ventricular hypertrophy. Indeterminate diastolic filling due to E-A fusion. Right Ventricle: The right ventricular size is normal. No increase in right ventricular wall thickness. Right ventricular systolic function is normal. The tricuspid regurgitant velocity is 1.57 m/s, and with an assumed right atrial pressure of 15 mmHg, the estimated right ventricular systolic pressure is 32.3 mmHg. Left Atrium: Left atrial size was normal in size. Right Atrium: Right atrial size was normal in size.  Pericardium: A moderately sized pericardial effusion is present. The pericardial effusion is circumferential. There is excessive respiratory variation in septal movement, diastolic collapse of the right ventricular free wall, diastolic collapse of the right atrial wall, excessive respiratory variation in the mitral valve spectral Doppler velocities and excessive respiratory variation in the tricuspid valve spectral Doppler velocities. There is evidence of cardiac tamponade. Mitral Valve: The mitral valve is normal in structure. No evidence of mitral valve regurgitation. Tricuspid Valve: The tricuspid valve is normal in structure. Tricuspid valve regurgitation is not demonstrated. Aortic Valve: The aortic valve is tricuspid. Aortic valve regurgitation is not visualized. No aortic stenosis is present. Pulmonic Valve: The pulmonic valve was normal in structure. Pulmonic valve regurgitation is not visualized. Aorta: The aortic root is normal in size and structure. Venous: The inferior vena cava is dilated in size with less than 50% respiratory variability, suggesting right atrial pressure of 15 mmHg. IAS/Shunts: No atrial level shunt detected by color flow Doppler.  LEFT VENTRICLE PLAX 2D LVIDd:  4.20 cm Diastology LVIDs:         2.15 cm LV e' medial:    7.83 cm/s LV PW:         1.00 cm LV E/e' medial:  4.5 LV IVS:        0.90 cm LV e' lateral:   7.51 cm/s                        LV E/e' lateral: 4.7  RIGHT VENTRICLE             IVC RV S prime:     26.80 cm/s  IVC diam: 2.70 cm TAPSE (M-mode): 2.3 cm LEFT ATRIUM             Index       RIGHT ATRIUM          Index LA diam:        2.20 cm 1.23 cm/m  RA Area:     7.14 cm LA Vol (A2C):   16.3 ml 9.11 ml/m  RA Volume:   12.30 ml 6.87 ml/m LA Vol (A4C):   18.3 ml 10.22 ml/m LA Biplane Vol: 19.1 ml 10.67 ml/m  AORTIC VALVE LVOT Vmax:   66.60 cm/s LVOT Vmean:  44.300 cm/s LVOT VTI:    0.089 m  AORTA Ao Asc diam: 3.30 cm MV E velocity: 35.10 cm/s  TRICUSPID VALVE MV  A velocity: 57.80 cm/s  TR Peak grad:   9.9 mmHg MV E/A ratio:  0.61        TR Vmax:        157.46 cm/s                             SHUNTS                            Systemic VTI: 0.09 m Dani Gobble Croitoru MD Electronically signed by Sanda Klein MD Signature Date/Time: 11/10/2020/6:07:36 PM    Final         Scheduled Meds: . Chlorhexidine Gluconate Cloth  6 each Topical Daily  . fentaNYL      . insulin aspart  0-9 Units Subcutaneous TID WC  . levalbuterol  0.63 mg Nebulization Q6H  . mouth rinse  15 mL Mouth Rinse BID  . nicotine  14 mg Transdermal Daily   Continuous Infusions: . sodium chloride 100 mL/hr at 11/11/20 0400  . acetaminophen       LOS: 1 day    Time spent: 35 minutes.     Elmarie Shiley, MD [facility]   If 7PM-7AM, please contact night-coverage [email or web address]  11/11/2020, 7:29 AM

## 2020-11-11 NOTE — Progress Notes (Signed)
Progress Note  Patient Name: Farrell Pantaleo Date of Encounter: 11/11/2020  [facility] Cardiologist: No primary care provider on file. NEW  Subjective   Asked again, as soon as I walked into the room: "Am I dying?" Breathing improved after pericardial drainage, but he had bloody sputum this AM (first time). Off anticoagulant. Pain in multiple locations.  Inpatient Medications    Scheduled Meds: . Chlorhexidine Gluconate Cloth  6 each Topical Daily  . enoxaparin (LOVENOX) injection  40 mg Subcutaneous Daily  . feeding supplement  237 mL Oral BID BM  . fentaNYL      . insulin aspart  0-9 Units Subcutaneous TID WC  . levalbuterol  0.63 mg Nebulization Q6H  . mouth rinse  15 mL Mouth Rinse BID  . nicotine  14 mg Transdermal Daily  . senna-docusate  1 tablet Oral BID   Continuous Infusions: . sodium chloride 100 mL/hr at 11/11/20 0400  . sodium chloride    . acetaminophen    .  ceFAZolin (ANCEF) IV     PRN Meds: Place/Maintain arterial line **AND** sodium chloride, guaiFENesin, HYDROcodone-acetaminophen, morphine injection, ondansetron **OR** ondansetron (ZOFRAN) IV, traMADol   Vital Signs    Vitals:   11/11/20 0030 11/11/20 0100 11/11/20 0156 11/11/20 0342  BP: 120/88 (!) 109/91 137/88   Pulse: 90 92 93   Resp: 14 (!) 24 18 19   Temp:   98.8 F (37.1 C) 98 F (36.7 C)  TempSrc:   Oral Oral  SpO2: 98% 97% 91%   Weight:      Height:        Intake/Output Summary (Last 24 hours) at 11/11/2020 0900 Last data filed at 11/11/2020 0400 Gross per 24 hour  Intake 3032.04 ml  Output 1143 ml  Net 1889.04 ml   Last 3 Weights 11/10/2020 08/26/2015  Weight (lbs) 136 lb 135 lb  Weight (kg) 61.689 kg 61.236 kg      Telemetry    Sinus tachycardia, occ PVCs - Personally Reviewed  ECG    Sinus tachycardia, diffuse ST elevation and PR depression - Personally Reviewed  Physical Exam  Worried. GEN: No acute distress.   Neck: No JVD Cardiac: RRR, no murmurs, rubs, or  gallops.  Respiratory: Clear to auscultation bilaterally. GI: Soft, nontender, non-distended  MS: No edema; No deformity. Neuro:  Nonfocal  Psych: Normal affect   Labs    High Sensitivity Troponin:   Recent Labs  Lab 11/10/20 0114 11/10/20 0304  TROPONINIHS 16 16      Chemistry Recent Labs  Lab 11/09/20 2242 11/10/20 0408 11/10/20 1952 11/11/20 0416  NA 138 137 136 133*  K 3.6 3.5 4.0 4.1  CL 107 106  --  104  CO2 21* 21*  --  20*  GLUCOSE 122* 174*  --  127*  BUN 14 13  --  9  CREATININE 0.79 0.80  --  0.68  CALCIUM 9.0 8.9  --  8.9  PROT 6.1*  --   --   --   ALBUMIN 3.2*  --   --   --   AST 20  --   --   --   ALT 18  --   --   --   ALKPHOS 93  --   --   --   BILITOT 0.8  --   --   --   GFRNONAA >60 >60  --  >60  ANIONGAP 10 10  --  9     Hematology Recent Labs  Lab 11/09/20 2242 11/10/20 1055 11/10/20 1952 11/11/20 0416  WBC 8.1 6.7  --  9.7  RBC 3.68* 3.63*  --  3.19*  HGB 12.2* 11.7* 8.5* 10.7*  HCT 35.3* 35.9* 25.0* 29.8*  MCV 95.9 98.9  --  93.4  MCH 33.2 32.2  --  33.5  MCHC 34.6 32.6  --  35.9  RDW 13.4 13.4  --  13.2  PLT 288 268  --  233    BNPNo results for input(s): BNP, PROBNP in the last 168 hours.   DDimer No results for input(s): DDIMER in the last 168 hours.   Radiology    DG Chest 2 View  Result Date: 11/09/2020 CLINICAL DATA:  Back pain and productive cough. EXAM: CHEST - 2 VIEW COMPARISON:  None. FINDINGS: Lungs are hyperexpanded. Soft tissue fullness noted in the right hilum. Left lung unremarkable. No pleural effusion. The visualized bony structures of the thorax show no acute abnormality. IMPRESSION: Soft tissue fullness in the right hilum. CT chest with contrast recommended to further evaluate as lymphadenopathy/neoplasm a concern. Electronically Signed   By: Misty Stanley M.D.   On: 11/09/2020 20:21   CT Chest W Contrast  Addendum Date: 11/10/2020   ADDENDUM REPORT: 11/10/2020 01:06 ADDENDUM: Additional small centrally  necrotic appearing deposits appear to be located either along or adjacent the right hemidiaphragm, either within the lung base or subphrenic space (3/150). Addendum was called by telephone at the time of interpretation on 11/10/2020 at 1:06 am to provider GARRETT GREEN , who verbally acknowledged these results. Electronically Signed   By: Lovena Le M.D.   On: 11/10/2020 01:06   Result Date: 11/10/2020 CLINICAL DATA:  Abnormal radiograph, concern for mass EXAM: CT CHEST WITH CONTRAST TECHNIQUE: Multidetector CT imaging of the chest was performed during intravenous contrast administration. CONTRAST:  49mL OMNIPAQUE IOHEXOL 300 MG/ML  SOLN COMPARISON:  Radiograph 11/09/2020 FINDINGS: Cardiovascular: Normal cardiac size. Intermediate attenuation pericardial effusion, possibly malignant given the bulky mediastinal and hilar nodal disease. The aortic root is suboptimally assessed given cardiac pulsation artifact. The aorta is normal caliber. Aberrant right subclavian artery with small diverticulum of Kommerell. Proximal great vessels are otherwise unremarkable. Central pulmonary arteries are top-normal caliber. No large central or lobar filling defects within limitations of this non tailored examination of the pulmonary arteries. Reflux of contrast into the IVC and hepatic veins. Hypoattenuating filling defect is seen extending from the within the left brachiocephalic vein 1/60-10, clear this reflects direct extension or merely thrombus in the setting of compression by the bulky disease Mediastinum/Nodes: Extensive mediastinal, hilar and supraclavicular adenopathy throughout the chest bilaterally. Some central hypoattenuation concerning necrotic adenopathy. Largest supraclavicular nodal deposit noted on the right measuring up to 3.1 x 3 cm in size (3/23). Paratracheal node measuring 3.3 x 1.9 cm (3/70). Right hilar deposit measuring 3.3 x 3.2 cm (3/90). Conglomerate adenopathy noted in the AP window and prevascular  space as well. No acute abnormality of the trachea or esophagus. Hypoattenuating nodule in the thyroid isthmus measuring up to 1.7 cm in size. Lungs/Pleura: Centrally hypoattenuating lobular mass lesion seen in the posterosuperior segment right lower lobe measuring approximately 2.3 x 2.4 cm in size (4/93). Additional smaller nodules are seen throughout both lungs. Findings are on a background of diffuse centrilobular and paraseptal emphysematous changes as well as bronchitic features with airways thickening and scattered secretions. Regions of bandlike scarring and/or atelectasis are noted throughout both lungs. Trace right effusion with adjacent atelectasis. No left effusion. Upper Abdomen: Heterogeneous,  largely hypoattenuating focus in the tail of the pancreas measuring 2.5 x 2.1 cm (3/21). Indeterminate hyperattenuating focus in the posterior right lobe liver measuring 1.3 x 1.3 cm (3/182), possible flash filling hemangioma though should be viewed with conspicuity given additional findings. No other acute abnormality in the upper abdomen. Musculoskeletal: Lucent lesion seen involving the T9 anterior inferior vertebral body with some mild soft tissue extension. Additional lucent lesion in the posterior left ninth rib (3/114) some ill defined sclerotic change in the right seventh rib laterally. No other consider acute or conspicuous osseous lesions. IMPRESSION: 1. 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 Differential considerations could include a primary pulmonary malignancy with widespread metastases versus metastatic disease from a partially visualized pancreatic tail lesion. Alternatively, atypical infection such as  mycobacterial/tuberculosis should be excluded as well. 2. Trace right pleural effusion. 3. Reflux of contrast in the hepatic veins and IVC could reflect some elevated right heart pressures/right-sided dysfunction. Correlate with echocardiogram particularly given the presence pericardial effusion. 4. Hypoattenuating filling defect is seen in the left brachiocephalic vein, unclear if this could reflect extension from the nodal disease or merely bland thrombus with compression of the left brachiocephalic vein as it passes through the extensive prevascular deposits. No large central or lobar filling defects are seen within the pulmonary arteries though evaluation limited on this non tailored examination. 5. Lucent lesion involving the T9 anterior inferior vertebral body with some mild soft tissue extension. Additional lucent lesion in the posterior left ninth rib. Additional ill defined sclerotic change in the right seventh rib laterally. 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 6. Indeterminate hyperattenuating focus in the posterior right lobe liver measuring 1.3 x 1.3 cm, possible flash filling hemangioma though should be viewed with conspicuity given additional findings. 7. Hypoattenuating 1.7 cm nodule in the thyroid isthmus, Recommend thyroid US (ref: J Am Coll Radiol. 2015 Feb;12(2): 143-50). 8. Aberrant right subclavian artery. 9. Emphysema (ICD10-J43.9). These results were called by telephone at the time of interpretation on 11/10/2020 at 12:49 am to provider PA McDonald, who verbally acknowledged these results. Electronically Signed: By: Lovena Le M.D. On: 11/10/2020 00:51   CT ABDOMEN PELVIS W CONTRAST  Result Date: 11/10/2020 CLINICAL DATA:  Metastatic disease evaluation EXAM: CT ABDOMEN AND PELVIS WITH CONTRAST TECHNIQUE: Multidetector CT imaging of the abdomen and pelvis was performed using the standard protocol following bolus administration of intravenous contrast. CONTRAST:  124mL  OMNIPAQUE IOHEXOL 300 MG/ML  SOLN COMPARISON:  Same-day CT chest, 11/10/2020 FINDINGS: Lower chest: Small right pleural effusion, increased compared to prior examination. Large pericardial effusion, similar to prior. Hepatobiliary: No solid liver abnormality is seen. Periportal edema. Pericholecystic fluid, nonspecific in the setting of ascites. No gallstones, gallbladder wall thickening, or biliary dilatation. Pancreas: Unremarkable. No pancreatic ductal dilatation or surrounding inflammatory changes. Spleen: Normal in size without significant abnormality. Adrenals/Urinary Tract: Adrenal glands are unremarkable. Kidneys are normal, without renal calculi, solid lesion, or hydronephrosis. Bladder is unremarkable. Stomach/Bowel: Stomach is within normal limits. Appendix appears normal. No evidence of bowel wall thickening, distention, or inflammatory changes. Vascular/Lymphatic: Aortic atherosclerosis. No enlarged abdominal or pelvic lymph nodes. Reproductive: No mass or other significant abnormality. Other: No abdominal wall hernia or abnormality. Small volume ascites throughout the abdomen and pelvis. Musculoskeletal: Small lucent and rim sclerotic lesions noted  in the pelvis, for example in the right ilium (series 3, image 54, 57). IMPRESSION: 1. No CT evidence of lymphadenopathy soft tissue metastatic disease in the abdomen or pelvis. 2. Small lucent and rim sclerotic lesions noted in the bony pelvis, suspicious for subtle osseous metastatic disease given the presence of a presumed metastatic lesion of the T9 vertebral body. PET-CT is more sensitive for the detection of subtle metabolically active osseous metastatic disease. 3. Small volume ascites throughout the abdomen and pelvis, nonspecific. 4. Periportal edema and pericholecystic fluid, nonspecific in the setting of ascites. 5. Small right pleural effusion, increased compared to prior examination. 6. Large pericardial effusion, similar to prior examination.  Aortic Atherosclerosis (ICD10-I70.0). Electronically Signed   By: Eddie Candle M.D.   On: 11/10/2020 12:46   DG CHEST PORT 1 VIEW  Result Date: 11/11/2020 CLINICAL DATA:  Hemoptysis.  Pericardial effusion. EXAM: PORTABLE CHEST 1 VIEW COMPARISON:  11/10/2020.  CT 11/10/2020. FINDINGS: Mediastinum and right hilar fullness again noted consistent with previously visualized adenopathy. Right base infiltrate again noted. Reference is made to recent chest CT report for discussion of extensive findings throughout the chest. No prominent pleural effusion. No pneumothorax. Pericardial drain in stable position. Heart size stable. No pulmonary venous congestion. Prior cervical spine fusion. Reference is made to chest CT report for discussion of bone lesions present. IMPRESSION: 1. Mediastinal and right hilar fullness again noted consistent with previously visualized adenopathy. Right base infiltrate again noted. No interim change. 2. Pericardial drain in stable position. No pneumothorax. Heart size stable. No pulmonary venous congestion. Chest is unchanged from prior exam. Reference is made to recent chest CT report for discussion of extensive findings throughout the chest. Electronically Signed   By: Marcello Moores  Register   On: 11/11/2020 08:29   DG Chest Port 1 View  Result Date: 11/10/2020 CLINICAL DATA:  Pericardial effusion status post drainage. EXAM: PORTABLE CHEST 1 VIEW COMPARISON:  CT chest from same day.  Chest x-ray from yesterday. FINDINGS: New pericardial drain. Normal heart size. Normal pulmonary vascularity. Unchanged right paratracheal and hilar fullness related to underlying lymphadenopathy. Emphysematous changes again noted. New patchy airspace disease at the right lung base. No pleural effusion or pneumothorax. IMPRESSION: 1. New pericardial drain. 2. New patchy airspace disease at the right lung base, suspicious for aspiration. 3. Unchanged mediastinal and right hilar lymphadenopathy. Electronically Signed    By: Titus Dubin M.D.   On: 11/10/2020 20:54   ECHOCARDIOGRAM COMPLETE  Result Date: 11/10/2020    ECHOCARDIOGRAM REPORT   Patient Name:   HUDSEN FEI Date of Exam: 11/10/2020 Medical Rec #:  [id number]      Height:       71.0 in Accession #:    [id number]     Weight:       136.0 lb Date of Birth:  07/12/1961      BSA:          1.790 m Patient Age:    61 years       BP:           112/79 mmHg Patient Gender: M              HR:           110 bpm. Exam Location:  Inpatient Procedure: 2D Echo Indications:    pericardial effusion  History:        Patient has no prior history of Echocardiogram examinations.  Signs/Symptoms:Shortness of Breath.  Sonographer:    Johny Chess Referring Phys: [id number] [place]  1. Left ventricular ejection fraction, by estimation, is 55 to 60%. The left ventricle has normal function. The left ventricle has no regional wall motion abnormalities. Indeterminate diastolic filling due to E-A fusion.  2. Right ventricular systolic function is normal. The right ventricular size is normal. The estimated right ventricular systolic pressure is 25.8 mmHg.  3. Moderate pericardial effusion. The pericardial effusion is circumferential. Findings are consistent with cardiac tamponade.  4. The mitral valve is normal in structure. No evidence of mitral valve regurgitation.  5. The aortic valve is tricuspid. Aortic valve regurgitation is not visualized. No aortic stenosis is present.  6. The inferior vena cava is dilated in size with <50% respiratory variability, suggesting right atrial pressure of 15 mmHg. FINDINGS  Left Ventricle: Left ventricular ejection fraction, by estimation, is 55 to 60%. The left ventricle has normal function. The left ventricle has no regional wall motion abnormalities. The left ventricular internal cavity size was normal in size. There is  no left ventricular hypertrophy. Indeterminate diastolic filling due to E-A fusion. Right Ventricle:  The right ventricular size is normal. No increase in right ventricular wall thickness. Right ventricular systolic function is normal. The tricuspid regurgitant velocity is 1.57 m/s, and with an assumed right atrial pressure of 15 mmHg, the estimated right ventricular systolic pressure is 52.7 mmHg. Left Atrium: Left atrial size was normal in size. Right Atrium: Right atrial size was normal in size. Pericardium: A moderately sized pericardial effusion is present. The pericardial effusion is circumferential. There is excessive respiratory variation in septal movement, diastolic collapse of the right ventricular free wall, diastolic collapse of the right atrial wall, excessive respiratory variation in the mitral valve spectral Doppler velocities and excessive respiratory variation in the tricuspid valve spectral Doppler velocities. There is evidence of cardiac tamponade. Mitral Valve: The mitral valve is normal in structure. No evidence of mitral valve regurgitation. Tricuspid Valve: The tricuspid valve is normal in structure. Tricuspid valve regurgitation is not demonstrated. Aortic Valve: The aortic valve is tricuspid. Aortic valve regurgitation is not visualized. No aortic stenosis is present. Pulmonic Valve: The pulmonic valve was normal in structure. Pulmonic valve regurgitation is not visualized. Aorta: The aortic root is normal in size and structure. Venous: The inferior vena cava is dilated in size with less than 50% respiratory variability, suggesting right atrial pressure of 15 mmHg. IAS/Shunts: No atrial level shunt detected by color flow Doppler.  LEFT VENTRICLE PLAX 2D LVIDd:         4.20 cm Diastology LVIDs:         2.15 cm LV e' medial:    7.83 cm/s LV PW:         1.00 cm LV E/e' medial:  4.5 LV IVS:        0.90 cm LV e' lateral:   7.51 cm/s                        LV E/e' lateral: 4.7  RIGHT VENTRICLE             IVC RV S prime:     26.80 cm/s  IVC diam: 2.70 cm TAPSE (M-mode): 2.3 cm LEFT ATRIUM              Index       RIGHT ATRIUM          Index LA diam:  2.20 cm 1.23 cm/m  RA Area:     7.14 cm LA Vol (A2C):   16.3 ml 9.11 ml/m  RA Volume:   12.30 ml 6.87 ml/m LA Vol (A4C):   18.3 ml 10.22 ml/m LA Biplane Vol: 19.1 ml 10.67 ml/m  AORTIC VALVE LVOT Vmax:   66.60 cm/s LVOT Vmean:  44.300 cm/s LVOT VTI:    0.089 m  AORTA Ao Asc diam: 3.30 cm MV E velocity: 35.10 cm/s  TRICUSPID VALVE MV A velocity: 57.80 cm/s  TR Peak grad:   9.9 mmHg MV E/A ratio:  0.61        TR Vmax:        157.46 cm/s                             SHUNTS                            Systemic VTI: 0.09 m Julicia Krieger MD Electronically signed by Sanda Klein MD Signature Date/Time: 11/10/2020/6:07:36 PM    Final     Cardiac Studies   Echo above  Patient Profile     60 y.o. male smoker presents with pericardial tamponade, necrotic lung mass RLL, widespread necrotic chest lymphadenopathy, lesion in tail of pancreas, multiple bony lesions suggestive of metastatic spread, left brachiocephalic vein thrombosis, now day #1 s/p pericardial window (350 ml bloody fluid).  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 TB not fully excluded, less likely with multiple bony lesions. Awaiting pathology results.  Pulmonary consultation if pathology nondiagnostic. Oncology consultation if malignancy is confirmed. CT drainage until volume lessens. No anticoagulation with active hemoptysis. Pain control.  He reports that he is not afraid of dying, but wants to die at home, not in the hospital. Prognosis is likely to be grim and early palliative care consultation is appropriate.      For questions or updates, please contact [place] Please consult [email or web address] for contact info under        Signed, Sanda Klein, MD  11/11/2020, 9:00 AM

## 2020-11-12 ENCOUNTER — Inpatient Hospital Stay (HOSPITAL_COMMUNITY): Payer: Medicaid Other

## 2020-11-12 LAB — COMPREHENSIVE METABOLIC PANEL
ALT: 15 U/L (ref 0–44)
AST: 21 U/L (ref 15–41)
Albumin: 2.6 g/dL — ABNORMAL LOW (ref 3.5–5.0)
Alkaline Phosphatase: 67 U/L (ref 38–126)
Anion gap: 7 (ref 5–15)
BUN: 9 mg/dL (ref 6–20)
CO2: 22 mmol/L (ref 22–32)
Calcium: 8.5 mg/dL — ABNORMAL LOW (ref 8.9–10.3)
Chloride: 105 mmol/L (ref 98–111)
Creatinine, Ser: 0.81 mg/dL (ref 0.61–1.24)
GFR, Estimated: >60 mL/min (ref >60)
Glucose, Bld: 107 mg/dL — ABNORMAL HIGH (ref 70–99)
Potassium: 3.5 mmol/L (ref 3.5–5.1)
Sodium: 134 mmol/L — ABNORMAL LOW (ref 135–145)
Total Bilirubin: 0.6 mg/dL (ref 0.3–1.2)
Total Protein: 5 g/dL — ABNORMAL LOW (ref 6.5–8.1)

## 2020-11-12 LAB — CBC
HCT: 29.3 % — ABNORMAL LOW (ref 39.0–52.0)
Hemoglobin: 10.3 g/dL — ABNORMAL LOW (ref 13.0–17.0)
MCH: 33 pg (ref 26.0–34.0)
MCHC: 35.2 g/dL (ref 30.0–36.0)
MCV: 93.9 fL (ref 80.0–100.0)
Platelets: 240 10*3/uL (ref 150–400)
RBC: 3.12 MIL/uL — ABNORMAL LOW (ref 4.22–5.81)
RDW: 13.2 % (ref 11.5–15.5)
WBC: 10.3 10*3/uL (ref 4.0–10.5)
nRBC: 0 % (ref 0.0–0.2)

## 2020-11-12 LAB — GLUCOSE, CAPILLARY
Glucose-Capillary: 90 mg/dL (ref 70–99)
Glucose-Capillary: 91 mg/dL (ref 70–99)
Glucose-Capillary: 111 mg/dL — ABNORMAL HIGH (ref 70–99)
Glucose-Capillary: 102 mg/dL — ABNORMAL HIGH (ref 70–99)

## 2020-11-12 LAB — MAGNESIUM: Magnesium: 1.7 mg/dL (ref 1.7–2.4)

## 2020-11-12 MED ORDER — LEVALBUTEROL TARTRATE 45 MCG/ACT IN AERO
2.0000 | INHALATION_SPRAY | RESPIRATORY_TRACT | Status: DC | PRN
  Filled 2020-11-12: qty 15

## 2020-11-12 MED ORDER — POTASSIUM CHLORIDE CRYS ER 20 MEQ PO TBCR
40.0000 meq | EXTENDED_RELEASE_TABLET | Freq: Once | ORAL | Status: AC
  Administered 2020-11-12: 40 meq via ORAL
  Filled 2020-11-12: qty 2

## 2020-11-12 MED ORDER — POTASSIUM CHLORIDE 20 MEQ PO PACK
40.0000 meq | PACK | Freq: Once | ORAL | Status: DC
  Filled 2020-11-12: qty 2

## 2020-11-12 MED ORDER — SIMETHICONE 80 MG PO CHEW
80.0000 mg | CHEWABLE_TABLET | Freq: Four times a day (QID) | ORAL | Status: DC | PRN
  Administered 2020-11-12: 80 mg via ORAL
  Filled 2020-11-12: qty 1

## 2020-11-12 MED ORDER — OXYCODONE-ACETAMINOPHEN 5-325 MG PO TABS
1.0000 | ORAL_TABLET | ORAL | Status: DC | PRN
  Administered 2020-11-12 – 2020-11-18 (×10): 2 via ORAL
  Filled 2020-11-12 (×10): qty 2

## 2020-11-12 MED ORDER — PANTOPRAZOLE SODIUM 40 MG IV SOLR
40.0000 mg | Freq: Two times a day (BID) | INTRAVENOUS | Status: DC
  Administered 2020-11-12 – 2020-11-14 (×5): 40 mg via INTRAVENOUS
  Filled 2020-11-12 (×5): qty 40

## 2020-11-12 MED ORDER — MAGNESIUM SULFATE 2 GM/50ML IV SOLN
2.0000 g | Freq: Once | INTRAVENOUS | Status: AC
  Administered 2020-11-12: 2 g via INTRAVENOUS
  Filled 2020-11-12: qty 50

## 2020-11-12 MED ORDER — KETOROLAC TROMETHAMINE 15 MG/ML IJ SOLN
15.0000 mg | Freq: Four times a day (QID) | INTRAMUSCULAR | Status: AC
  Administered 2020-11-12 (×3): 15 mg via INTRAVENOUS
  Filled 2020-11-12 (×3): qty 1

## 2020-11-12 MED ORDER — HYDROMORPHONE HCL 1 MG/ML IJ SOLN
1.0000 mg | INTRAMUSCULAR | Status: DC | PRN
Start: 2020-11-12 — End: 2020-11-18
  Administered 2020-11-12 – 2020-11-18 (×29): 1 mg via INTRAVENOUS
  Filled 2020-11-12 (×29): qty 1

## 2020-11-12 MED ORDER — METOPROLOL TARTRATE 5 MG/5ML IV SOLN
5.0000 mg | Freq: Once | INTRAVENOUS | Status: AC
  Administered 2020-11-12: 5 mg via INTRAVENOUS
  Filled 2020-11-12: qty 5

## 2020-11-12 NOTE — Progress Notes (Addendum)
HOSPITAL MEDICINE OVERNIGHT EVENT NOTE    Notified by nursing that patient is suddenly gone into rapid atrial fibrillation with heart rates averaging in the 130s on telemetry.  Patient is hemodynamically stable.  Patient denies chest pain or shortness of breath although does complain of some abdominal discomfort and nursing feels may be secondary to constipation.  EKG reveals no evidence of dynamic ST segment change.  Recent TSH is unremarkable.  Potassium this morning is found to be 3.5, there is no magnesium level available so therefore we will obtain it.  Most likely cause of patient's atrial fibrillation is the pericardial effusion/recent pericardial window.  We will administer 5 mg of intravenous metoprolol.  Will provide patient with 40 mEq of oral potassium chloride elixir.  We will add on a magnesium level.  We will continue to monitor on telemetry and monitor for rate response after administration of metoprolol.  Will abstain from initiation of anticoagulation at this time considering pericardial window and defer decision on this to the day team/cardiology.  Vernelle Emerald  MD [facility]

## 2020-11-12 NOTE — Discharge Instructions (Signed)
Discharge Instructions:  1. You may shower, please wash incisions daily with soap and water and keep dry.  If you wish to cover wounds with dressing you may do so but please keep clean and change daily.  No tub baths or swimming until incisions have completely healed.  If your incisions become red or develop any drainage please call our office at [phone]  2. No Driving until cleared by Dr. Vivi Martens office and you are no longer using narcotic pain medications  3. Fever of 101.5 for at least 24 hours with no source, please contact our office at [phone]  4. Activity- up as tolerated, please walk at least 3 times per day.  Avoid strenuous activity, no lifting, pushing, or pulling with your arms over 8-10 lbs for a minimum of 6 weeks  5. If any questions or concerns arise, please do not hesitate to contact our office at [phone]

## 2020-11-12 NOTE — Progress Notes (Signed)
Pt hr 90-140's, afib, bp 124/90,  No acute distress.  Paged [place] for orders.

## 2020-11-12 NOTE — Progress Notes (Signed)
Progress Note  Patient Name: Brian Leonard Date of Encounter: 11/12/2020  [facility] Cardiologist: No primary care provider on file. New  Subjective   Adequate pain control. No further hemoptysis. Transient AFib w RVR lasting about 2 hours overnight. Now in NSR. CT drained 60 ml.  Inpatient Medications    Scheduled Meds:  Chlorhexidine Gluconate Cloth  6 each Topical Daily   enoxaparin (LOVENOX) injection  40 mg Subcutaneous Daily   insulin aspart  0-9 Units Subcutaneous TID WC   ketorolac  15 mg Intravenous Q6H   levalbuterol  2 puff Inhalation Q6H   mouth rinse  15 mL Mouth Rinse BID   nicotine  14 mg Transdermal Daily   pantoprazole (PROTONIX) IV  40 mg Intravenous Q12H   senna-docusate  1 tablet Oral BID   Continuous Infusions:  sodium chloride 50 mL/hr at 11/12/20 0605   sodium chloride     magnesium sulfate bolus IVPB     PRN Meds: Place/Maintain arterial line **AND** sodium chloride, diphenhydrAMINE, guaiFENesin, HYDROmorphone, ondansetron **OR** ondansetron (ZOFRAN) IV, oxyCODONE-acetaminophen, simethicone, traMADol   Vital Signs    Vitals:   11/12/20 0200 11/12/20 0205 11/12/20 0330 11/12/20 0917  BP: 124/90 124/90 (!) 124/95   Pulse: 99 (!) 113 (!) 109   Resp: 20 20 19  (!) 21  Temp:  98 F (36.7 C)  98.3 F (36.8 C)  TempSrc:  Oral  Oral  SpO2: 93% 95% 93%   Weight:      Height:        Intake/Output Summary (Last 24 hours) at 11/12/2020 0929 Last data filed at 11/12/2020 0845 Gross per 24 hour  Intake 1623.12 ml  Output 760 ml  Net 863.12 ml   Last 3 Weights 11/10/2020 08/26/2015  Weight (lbs) 136 lb 135 lb  Weight (kg) 61.689 kg 61.236 kg      Telemetry    NSR, AFib w RVR 0200-0400h - Personally Reviewed  ECG    SR w PACs, diffuse ST elevation of pericarditis - Personally Reviewed  Physical Exam  Depresses GEN: No acute distress.   Neck: No JVD Cardiac: RRR, no murmurs, + rub. no gallops.  Respiratory: diminished R base GI:  Soft, nontender, non-distended  MS: No edema; No deformity. Neuro:  Nonfocal  Psych: Normal affect   Labs    High Sensitivity Troponin:   Recent Labs  Lab 11/10/20 0114 11/10/20 0304  TROPONINIHS 16 16      Chemistry Recent Labs  Lab 11/09/20 2242 11/10/20 0408 11/10/20 1952 11/11/20 0416 11/12/20 0021  NA 138 137 136 133* 134*  K 3.6 3.5 4.0 4.1 3.5  CL 107 106  --  104 105  CO2 21* 21*  --  20* 22  GLUCOSE 122* 174*  --  127* 107*  BUN 14 13  --  9 9  CREATININE 0.79 0.80  --  0.68 0.81  CALCIUM 9.0 8.9  --  8.9 8.5*  PROT 6.1*  --   --   --  5.0*  ALBUMIN 3.2*  --   --   --  2.6*  AST 20  --   --   --  21  ALT 18  --   --   --  15  ALKPHOS 93  --   --   --  67  BILITOT 0.8  --   --   --  0.6  GFRNONAA >60 >60  --  >60 >60  ANIONGAP 10 10  --  9 7  Hematology Recent Labs  Lab 11/10/20 1055 11/10/20 1952 11/11/20 0416 11/12/20 0021  WBC 6.7  --  9.7 10.3  RBC 3.63*  --  3.19* 3.12*  HGB 11.7* 8.5* 10.7* 10.3*  HCT 35.9* 25.0* 29.8* 29.3*  MCV 98.9  --  93.4 93.9  MCH 32.2  --  33.5 33.0  MCHC 32.6  --  35.9 35.2  RDW 13.4  --  13.2 13.2  PLT 268  --  233 240    BNPNo results for input(s): BNP, PROBNP in the last 168 hours.   DDimer No results for input(s): DDIMER in the last 168 hours.   Radiology    CT ABDOMEN PELVIS W CONTRAST  Result Date: 11/10/2020 CLINICAL DATA:  Metastatic disease evaluation EXAM: CT ABDOMEN AND PELVIS WITH CONTRAST TECHNIQUE: Multidetector CT imaging of the abdomen and pelvis was performed using the standard protocol following bolus administration of intravenous contrast. CONTRAST:  158mL OMNIPAQUE IOHEXOL 300 MG/ML  SOLN COMPARISON:  Same-day CT chest, 11/10/2020 FINDINGS: Lower chest: Small right pleural effusion, increased compared to prior examination. Large pericardial effusion, similar to prior. Hepatobiliary: No solid liver abnormality is seen. Periportal edema. Pericholecystic fluid, nonspecific in the setting of  ascites. No gallstones, gallbladder wall thickening, or biliary dilatation. Pancreas: Unremarkable. No pancreatic ductal dilatation or surrounding inflammatory changes. Spleen: Normal in size without significant abnormality. Adrenals/Urinary Tract: Adrenal glands are unremarkable. Kidneys are normal, without renal calculi, solid lesion, or hydronephrosis. Bladder is unremarkable. Stomach/Bowel: Stomach is within normal limits. Appendix appears normal. No evidence of bowel wall thickening, distention, or inflammatory changes. Vascular/Lymphatic: Aortic atherosclerosis. No enlarged abdominal or pelvic lymph nodes. Reproductive: No mass or other significant abnormality. Other: No abdominal wall hernia or abnormality. Small volume ascites throughout the abdomen and pelvis. Musculoskeletal: Small lucent and rim sclerotic lesions noted in the pelvis, for example in the right ilium (series 3, image 54, 57). IMPRESSION: 1. No CT evidence of lymphadenopathy soft tissue metastatic disease in the abdomen or pelvis. 2. Small lucent and rim sclerotic lesions noted in the bony pelvis, suspicious for subtle osseous metastatic disease given the presence of a presumed metastatic lesion of the T9 vertebral body. PET-CT is more sensitive for the detection of subtle metabolically active osseous metastatic disease. 3. Small volume ascites throughout the abdomen and pelvis, nonspecific. 4. Periportal edema and pericholecystic fluid, nonspecific in the setting of ascites. 5. Small right pleural effusion, increased compared to prior examination. 6. Large pericardial effusion, similar to prior examination. Aortic Atherosclerosis (ICD10-I70.0). Electronically Signed   By: Eddie Candle M.D.   On: 11/10/2020 12:46   DG CHEST PORT 1 VIEW  Result Date: 11/12/2020 CLINICAL DATA:  Exudative pericardial effusion status post pericardial window EXAM: PORTABLE CHEST 1 VIEW COMPARISON:  11/11/2020 FINDINGS: Right basilar consolidation and  associated small right pleural effusion is stable. Mild interval improvement in interstitial infiltrate within the right upper lobe. No typical biapical cicatricial change or cavitary lesions identified to suggest granulomatous infection. Small left basilar pleural effusion is again noted. No pneumothorax. Cardiac size within normal limits. Right hilar enlargement again seen in keeping with right hilar adenopathy better seen on prior CT examination. IMPRESSION: Minimally improved right lung pulmonary infiltrate. Right basilar consolidation and associated small right pleural effusion again noted. Small left pleural effusion present. Enlargement of the right hilum related to adenopathy unchanged. Electronically Signed   By: Fidela Salisbury MD   On: 11/12/2020 06:29   DG CHEST PORT 1 VIEW  Result Date: 11/11/2020 CLINICAL DATA:  Hemoptysis.  Pericardial effusion. EXAM: PORTABLE CHEST 1 VIEW COMPARISON:  11/10/2020.  CT 11/10/2020. FINDINGS: Mediastinum and right hilar fullness again noted consistent with previously visualized adenopathy. Right base infiltrate again noted. Reference is made to recent chest CT report for discussion of extensive findings throughout the chest. No prominent pleural effusion. No pneumothorax. Pericardial drain in stable position. Heart size stable. No pulmonary venous congestion. Prior cervical spine fusion. Reference is made to chest CT report for discussion of bone lesions present. IMPRESSION: 1. Mediastinal and right hilar fullness again noted consistent with previously visualized adenopathy. Right base infiltrate again noted. No interim change. 2. Pericardial drain in stable position. No pneumothorax. Heart size stable. No pulmonary venous congestion. Chest is unchanged from prior exam. Reference is made to recent chest CT report for discussion of extensive findings throughout the chest. Electronically Signed   By: Marcello Moores  Register   On: 11/11/2020 08:29   DG Chest Port 1  View  Result Date: 11/10/2020 CLINICAL DATA:  Pericardial effusion status post drainage. EXAM: PORTABLE CHEST 1 VIEW COMPARISON:  CT chest from same day.  Chest x-ray from yesterday. FINDINGS: New pericardial drain. Normal heart size. Normal pulmonary vascularity. Unchanged right paratracheal and hilar fullness related to underlying lymphadenopathy. Emphysematous changes again noted. New patchy airspace disease at the right lung base. No pleural effusion or pneumothorax. IMPRESSION: 1. New pericardial drain. 2. New patchy airspace disease at the right lung base, suspicious for aspiration. 3. Unchanged mediastinal and right hilar lymphadenopathy. Electronically Signed   By: Titus Dubin M.D.   On: 11/10/2020 20:54   ECHOCARDIOGRAM COMPLETE  Result Date: 11/10/2020    ECHOCARDIOGRAM REPORT   Patient Name:   Brian Leonard Date of Exam: 11/10/2020 Medical Rec #:  [id number]      Height:       71.0 in Accession #:    [id number]     Weight:       136.0 lb Date of Birth:  [birth year]      BSA:          1.790 m Patient Age:    14 years       BP:           112/79 mmHg Patient Gender: M              HR:           110 bpm. Exam Location:  Inpatient Procedure: 2D Echo Indications:    pericardial effusion  History:        Patient has no prior history of Echocardiogram examinations.                 Signs/Symptoms:Shortness of Breath.  Sonographer:    Johny Chess Referring Phys: [id number] [place]  1. Left ventricular ejection fraction, by estimation, is 55 to 60%. The left ventricle has normal function. The left ventricle has no regional wall motion abnormalities. Indeterminate diastolic filling due to E-A fusion.  2. Right ventricular systolic function is normal. The right ventricular size is normal. The estimated right ventricular systolic pressure is 94.8 mmHg.  3. Moderate pericardial effusion. The pericardial effusion is circumferential. Findings are consistent with cardiac tamponade.  4. The mitral  valve is normal in structure. No evidence of mitral valve regurgitation.  5. The aortic valve is tricuspid. Aortic valve regurgitation is not visualized. No aortic stenosis is present.  6. The inferior vena cava is dilated in size with <50% respiratory variability, suggesting right atrial pressure of 15  mmHg. FINDINGS  Left Ventricle: Left ventricular ejection fraction, by estimation, is 55 to 60%. The left ventricle has normal function. The left ventricle has no regional wall motion abnormalities. The left ventricular internal cavity size was normal in size. There is  no left ventricular hypertrophy. Indeterminate diastolic filling due to E-A fusion. Right Ventricle: The right ventricular size is normal. No increase in right ventricular wall thickness. Right ventricular systolic function is normal. The tricuspid regurgitant velocity is 1.57 m/s, and with an assumed right atrial pressure of 15 mmHg, the estimated right ventricular systolic pressure is 67.1 mmHg. Left Atrium: Left atrial size was normal in size. Right Atrium: Right atrial size was normal in size. Pericardium: A moderately sized pericardial effusion is present. The pericardial effusion is circumferential. There is excessive respiratory variation in septal movement, diastolic collapse of the right ventricular free wall, diastolic collapse of the right atrial wall, excessive respiratory variation in the mitral valve spectral Doppler velocities and excessive respiratory variation in the tricuspid valve spectral Doppler velocities. There is evidence of cardiac tamponade. Mitral Valve: The mitral valve is normal in structure. No evidence of mitral valve regurgitation. Tricuspid Valve: The tricuspid valve is normal in structure. Tricuspid valve regurgitation is not demonstrated. Aortic Valve: The aortic valve is tricuspid. Aortic valve regurgitation is not visualized. No aortic stenosis is present. Pulmonic Valve: The pulmonic valve was normal in structure.  Pulmonic valve regurgitation is not visualized. Aorta: The aortic root is normal in size and structure. Venous: The inferior vena cava is dilated in size with less than 50% respiratory variability, suggesting right atrial pressure of 15 mmHg. IAS/Shunts: No atrial level shunt detected by color flow Doppler.  LEFT VENTRICLE PLAX 2D LVIDd:         4.20 cm Diastology LVIDs:         2.15 cm LV e' medial:    7.83 cm/s LV PW:         1.00 cm LV E/e' medial:  4.5 LV IVS:        0.90 cm LV e' lateral:   7.51 cm/s                        LV E/e' lateral: 4.7  RIGHT VENTRICLE             IVC RV S prime:     26.80 cm/s  IVC diam: 2.70 cm TAPSE (M-mode): 2.3 cm LEFT ATRIUM             Index       RIGHT ATRIUM          Index LA diam:        2.20 cm 1.23 cm/m  RA Area:     7.14 cm LA Vol (A2C):   16.3 ml 9.11 ml/m  RA Volume:   12.30 ml 6.87 ml/m LA Vol (A4C):   18.3 ml 10.22 ml/m LA Biplane Vol: 19.1 ml 10.67 ml/m  AORTIC VALVE LVOT Vmax:   66.60 cm/s LVOT Vmean:  44.300 cm/s LVOT VTI:    0.089 m  AORTA Ao Asc diam: 3.30 cm MV E velocity: 35.10 cm/s  TRICUSPID VALVE MV A velocity: 57.80 cm/s  TR Peak grad:   9.9 mmHg MV E/A ratio:  0.61        TR Vmax:        157.46 cm/s  SHUNTS                            Systemic VTI: 0.09 m Dani Gobble Ona Roehrs MD Electronically signed by Sanda Klein MD Signature Date/Time: 11/10/2020/6:07:36 PM    Final     Cardiac Studies   Echo above  Patient Profile     60 y.o. male smoker presents with pericardial tamponade, necrotic lung mass RLL, widespread necrotic chest lymphadenopathy, lesion in tail of pancreas, multiple bony lesions suggestive of metastatic spread, left brachiocephalic vein thrombosis, now day #2 s/p pericardial window (350 ml bloody fluid) performed 03/08. Brief AF w RVR early AM 03/10.  Assessment & Plan    1. Pericardial tamponade: strong suspicion for malignancy, likely lung. Path pending. CT left in longer to promote adhesions and lessen  chance of recurrent tamponade. 2. AFib: in setting of acute pericarditis, chest tube, hypokalemia. I do not think anticoagulation or antiarrhythmic therapy is indicated at this time. If it recurs frequently or is persistent, would use amiodarone. 3. 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 4. 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     For questions or updates, please contact [facility] Please consult [email or web address] for contact info under        Signed, Sanda Klein, MD  11/12/2020, 9:29 AM

## 2020-11-12 NOTE — Plan of Care (Signed)
  Problem: Education: Goal: Knowledge of General Education information will improve Description: Including pain rating scale, medication(s)/side effects and non-pharmacologic comfort measures Outcome: Progressing   Problem: Health Behavior/Discharge Planning: Goal: Ability to manage health-related needs will improve Outcome: Progressing   Problem: Clinical Measurements: Goal: Ability to maintain clinical measurements within normal limits will improve Outcome: Progressing   Problem: Activity: Goal: Risk for activity intolerance will decrease Outcome: Progressing   Problem: Nutrition: Goal: Adequate nutrition will be maintained Outcome: Progressing   Problem: Coping: Goal: Level of anxiety will decrease Outcome: Progressing   Problem: Elimination: Goal: Will not experience complications related to bowel motility Outcome: Progressing Goal: Will not experience complications related to urinary retention Outcome: Progressing   Problem: Skin Integrity: Goal: Risk for impaired skin integrity will decrease Outcome: Progressing

## 2020-11-12 NOTE — Plan of Care (Signed)

## 2020-11-12 NOTE — Progress Notes (Addendum)
PROGRESS NOTE    Brian Leonard  QVZ:[id number] DOB: April 08, 1961 DOA: 11/09/2020 PCP: Patient, No Pcp Per   Brief Narrative: 60 year old with past medical history significant for tobacco abuse, presents complaining of back pain, chest pain and cough.  Patient reports some chronic back pain but worse over the last month.  Is also complaining of chest pain.  He report productive sputum.  He continues to smoke 7 cigarettes/day.  Evaluation in the ED patient was afebrile slight tachypneic, tachycardic heart rate in the 110s blood pressure stable.  CT chest concerning for extensive necrotic adenopathy, necrotic right lower lobe mass, numerous scattered micronodules throughout bilateral lungs, pericardial effusion, bone lesions involving T9 and bilateral ribs, suspected thrombus in the left brachiocephalic vein, partially visualized pancreatic tail lesion subone other finding.  2D echo showed pericardial effusion with signs of tamponade.  Patient was taken to the OR emergently on 3/8 for pericardial window and drainage.   Assessment & Plan:   Principal Problem:   Pericardial effusion Active Problems:   Brachiocephalic vein thrombosis (HCC)   Multiple pulmonary nodules   Lymphadenopathy, thoracic   Right lower lobe lung mass   Thyroid nodule   Liver lesion, right lobe   Lesion of pancreas   Lesion of thoracic vertebra   Rib lesion  1-Pericardial effusion, tamponade: -He was tachycardic, with low blood pressure.  2D echo confirmed pericardial effusion with tamponade. -CVTS was consulted, patient underwent pericardial window done 3/8, yielding bloody fluid. -Cytology Biopsy Pending.   -Continue with pericardial drain, 60 cc last 24 hours.  -Appreciate cardiology and cardiothoracic surgery.  2-Lung mass, necrotic lymphadenopathy, bone lesions, pancreatic tail lesion. -Patient presents with history of increased cough, pleuritic pain, back pain found to have on CT chest extensive necrotic  lymphadenopathy, necrotic right lower lobe mass, numerous bilateral nodules, lucent T9 lesion with soft tissue extension, left ninth rib and right 7 rib lesions, hyper attenuating focus in the right lobe of liver, partially visualized pancreatic tail lesion. -ConcernPrimary pulmonary malignancy with widespread metastasis, pancreatic primary or less likely atypical infection. -Cytology from pericardial fluid biopsy, pending.  -Depending  on results may need oncology versus pulmonologist evaluation -AFB ordered and Quantiferon pending.   3-Brachiocephalic thrombosis: [name] left brachiocephalic vein thrombus noted on CT -Was initially started on IV heparin.  Hold postop.  Plan to continue to hold anticoagulation postsurgery due to hemoptysis and bloody drainage from pericardial window.  -on Low dose Lovenox, prophylaxis.   4-Thyroid nodule: He will need ultrasound for follow-up  5-Tobacco abuse: Motivated to quit, continue with nicotine patch 6-Mild hyponatremia: Monitor 7-Hemoptysis; related to lung mass, was on anticoagulation. Holding heparin. Hb stable 8-A fib RVR; Received IV metoprolol overnight. Received 40 meq kcl. Replete magnesium.     Estimated body mass index is 18.97 kg/m as calculated from the following:   Height as of this encounter: 5\' 11"  (1.803 m).   Weight as of this encounter: 61.7 kg.   DVT prophylaxis: Lovenox Code Status: Full code Family Communication: Care discussed with patient Disposition Plan:  Status is: Inpatient  Remains inpatient appropriate because:Ongoing diagnostic testing needed not appropriate for outpatient work up   Dispo:  Patient From: Home  Planned Disposition: Home  Medically stable for discharge: No          Consultants:   Cardiothoracic surgery  Cardiology  Procedures:   Pericardial window 3/8  Antimicrobials:    Subjective: He is complaining of epigastric pain, feels like gas.  Pain medications has not been  working.  No further episodes of Hemoptysis.   Objective: Vitals:   11/12/20 0046 11/12/20 0200 11/12/20 0205 11/12/20 0330  BP: 136/88 124/90 124/90 (!) 124/95  Pulse: 100 99 (!) 113 (!) 109  Resp: 20 20 20 19   Temp: 97.9 F (36.6 C)  98 F (36.7 C)   TempSrc: Oral  Oral   SpO2: 92% 93% 95% 93%  Weight:      Height:        Intake/Output Summary (Last 24 hours) at 11/12/2020 0739 Last data filed at 11/12/2020 5852 Gross per 24 hour  Intake 1623.12 ml  Output 810 ml  Net 813.12 ml   Filed Weights   11/10/20 0317  Weight: 61.7 kg    Examination:  General exam: NAD Respiratory system: No wheezing, no ronchus Cardiovascular system: S 1, S 2  RRR Subxiphoid drain in place bloody fluid Gastrointestinal system: BS present, soft. nt Central nervous system: Alert Extremities: no edema.    Data Reviewed: I have personally reviewed following labs and imaging studies  CBC: Recent Labs  Lab 11/09/20 2242 11/10/20 1055 11/10/20 1952 11/11/20 0416 11/12/20 0021  WBC 8.1 6.7  --  9.7 10.3  NEUTROABS 5.9  --   --   --   --   HGB 12.2* 11.7* 8.5* 10.7* 10.3*  HCT 35.3* 35.9* 25.0* 29.8* 29.3*  MCV 95.9 98.9  --  93.4 93.9  PLT 288 268  --  233 778   Basic Metabolic Panel: Recent Labs  Lab 11/09/20 2242 11/10/20 0408 11/10/20 1952 11/11/20 0416 11/12/20 0021 11/12/20 0054  NA 138 137 136 133* 134*  --   K 3.6 3.5 4.0 4.1 3.5  --   CL 107 106  --  104 105  --   CO2 21* 21*  --  20* 22  --   GLUCOSE 122* 174*  --  127* 107*  --   BUN 14 13  --  9 9  --   CREATININE 0.79 0.80  --  0.68 0.81  --   CALCIUM 9.0 8.9  --  8.9 8.5*  --   MG  --   --   --   --   --  1.7   GFR: Estimated Creatinine Clearance: 86.8 mL/min (by C-G formula based on SCr of 0.81 mg/dL). Liver Function Tests: Recent Labs  Lab 11/09/20 2242 11/12/20 0021  AST 20 21  ALT 18 15  ALKPHOS 93 67  BILITOT 0.8 0.6  PROT 6.1* 5.0*  ALBUMIN 3.2* 2.6*   Recent Labs  Lab 11/10/20 0114   LIPASE 68*   No results for input(s): AMMONIA in the last 168 hours. Coagulation Profile: No results for input(s): INR, PROTIME in the last 168 hours. Cardiac Enzymes: No results for input(s): CKTOTAL, CKMB, CKMBINDEX, TROPONINI in the last 168 hours. BNP (last 3 results) No results for input(s): PROBNP in the last 8760 hours. HbA1C: Recent Labs    11/11/20 0550  HGBA1C 5.9*   CBG: Recent Labs  Lab 11/11/20 0750 11/11/20 1144 11/11/20 1654 11/11/20 2138 11/12/20 0626  GLUCAP 124* 113* 109* 112* 90   Lipid Profile: No results for input(s): CHOL, HDL, LDLCALC, TRIG, CHOLHDL, LDLDIRECT in the last 72 hours. Thyroid Function Tests: Recent Labs    11/11/20 0600  TSH 0.672   Anemia Panel: No results for input(s): VITAMINB12, FOLATE, FERRITIN, TIBC, IRON, RETICCTPCT in the last 72 hours. Sepsis Labs: No results for input(s): PROCALCITON, LATICACIDVEN in the last 168 hours.  Recent  Results (from the past 240 hour(s))  Resp Panel by RT-PCR (Flu A&B, Covid) Nasopharyngeal Swab     Status: None   Collection Time: 11/10/20  1:55 AM   Specimen: Nasopharyngeal Swab; Nasopharyngeal(NP) swabs in vial transport medium  Result Value Ref Range Status   SARS Coronavirus 2 by RT PCR NEGATIVE NEGATIVE Final    Comment: (NOTE) SARS-CoV-2 target nucleic acids are NOT DETECTED.  The SARS-CoV-2 RNA is generally detectable in upper respiratory specimens during the acute phase of infection. The lowest concentration of SARS-CoV-2 viral copies this assay can detect is 138 copies/mL. A negative result does not preclude SARS-Cov-2 infection and should not be used as the sole basis for treatment or other patient management decisions. A negative result may occur with  improper specimen collection/handling, submission of specimen other than nasopharyngeal swab, presence of viral mutation(s) within the areas targeted by this assay, and inadequate number of viral copies(<138 copies/mL). A  negative result must be combined with clinical observations, patient history, and epidemiological information. The expected result is Negative.  Fact Sheet for Patients:  EntrepreneurPulse.com.au  Fact Sheet for Healthcare Providers:  IncredibleEmployment.be  This test is no t yet approved or cleared by the [place] FDA and  has been authorized for detection and/or diagnosis of SARS-CoV-2 by FDA under an Emergency Use Authorization (EUA). This EUA will remain  in effect (meaning this test can be used) for the duration of the COVID-19 declaration under Section 564(b)(1) of the Act, 21 U.S.C.section 360bbb-3(b)(1), unless the authorization is terminated  or revoked sooner.       Influenza A by PCR NEGATIVE NEGATIVE Final   Influenza B by PCR NEGATIVE NEGATIVE Final    Comment: (NOTE) The Xpert Xpress SARS-CoV-2/FLU/RSV plus assay is intended as an aid in the diagnosis of influenza from Nasopharyngeal swab specimens and should not be used as a sole basis for treatment. Nasal washings and aspirates are unacceptable for Xpert Xpress SARS-CoV-2/FLU/RSV testing.  Fact Sheet for Patients: EntrepreneurPulse.com.au  Fact Sheet for Healthcare Providers: IncredibleEmployment.be  This test is not yet approved or cleared by the [place] FDA and has been authorized for detection and/or diagnosis of SARS-CoV-2 by FDA under an Emergency Use Authorization (EUA). This EUA will remain in effect (meaning this test can be used) for the duration of the COVID-19 declaration under Section 564(b)(1) of the Act, 21 U.S.C. section 360bbb-3(b)(1), unless the authorization is terminated or revoked.  Performed at [facility], [place] [address], [place], [place] [address]   MRSA PCR Screening     Status: None   Collection Time: 11/11/20  2:28 AM   Specimen: Nasal Mucosa; Nasopharyngeal  Result Value Ref Range Status    MRSA by PCR NEGATIVE NEGATIVE Final    Comment:        The GeneXpert MRSA Assay (FDA approved for NASAL specimens only), is one component of a comprehensive MRSA colonization surveillance program. It is not intended to diagnose MRSA infection nor to guide or monitor treatment for MRSA infections. Performed at [facility], [place] [address], [place], [place] [address]          Radiology Studies: CT ABDOMEN PELVIS W CONTRAST  Result Date: 11/10/2020 CLINICAL DATA:  Metastatic disease evaluation EXAM: CT ABDOMEN AND PELVIS WITH CONTRAST TECHNIQUE: Multidetector CT imaging of the abdomen and pelvis was performed using the standard protocol following bolus administration of intravenous contrast. CONTRAST:  14mL OMNIPAQUE IOHEXOL 300 MG/ML  SOLN COMPARISON:  Same-day CT chest,  11/10/2020 FINDINGS: Lower chest: Small right pleural effusion, increased compared to prior examination. Large pericardial effusion, similar to prior. Hepatobiliary: No solid liver abnormality is seen. Periportal edema. Pericholecystic fluid, nonspecific in the setting of ascites. No gallstones, gallbladder wall thickening, or biliary dilatation. Pancreas: Unremarkable. No pancreatic ductal dilatation or surrounding inflammatory changes. Spleen: Normal in size without significant abnormality. Adrenals/Urinary Tract: Adrenal glands are unremarkable. Kidneys are normal, without renal calculi, solid lesion, or hydronephrosis. Bladder is unremarkable. Stomach/Bowel: Stomach is within normal limits. Appendix appears normal. No evidence of bowel wall thickening, distention, or inflammatory changes. Vascular/Lymphatic: Aortic atherosclerosis. No enlarged abdominal or pelvic lymph nodes. Reproductive: No mass or other significant abnormality. Other: No abdominal wall hernia or abnormality. Small volume ascites throughout the abdomen and pelvis. Musculoskeletal: Small lucent and rim sclerotic lesions noted in the pelvis, for  example in the right ilium (series 3, image 54, 57). IMPRESSION: 1. No CT evidence of lymphadenopathy soft tissue metastatic disease in the abdomen or pelvis. 2. Small lucent and rim sclerotic lesions noted in the bony pelvis, suspicious for subtle osseous metastatic disease given the presence of a presumed metastatic lesion of the T9 vertebral body. PET-CT is more sensitive for the detection of subtle metabolically active osseous metastatic disease. 3. Small volume ascites throughout the abdomen and pelvis, nonspecific. 4. Periportal edema and pericholecystic fluid, nonspecific in the setting of ascites. 5. Small right pleural effusion, increased compared to prior examination. 6. Large pericardial effusion, similar to prior examination. Aortic Atherosclerosis (ICD10-I70.0). Electronically Signed   By: Eddie Candle M.D.   On: 11/10/2020 12:46   DG CHEST PORT 1 VIEW  Result Date: 11/12/2020 CLINICAL DATA:  Exudative pericardial effusion status post pericardial window EXAM: PORTABLE CHEST 1 VIEW COMPARISON:  11/11/2020 FINDINGS: Right basilar consolidation and associated small right pleural effusion is stable. Mild interval improvement in interstitial infiltrate within the right upper lobe. No typical biapical cicatricial change or cavitary lesions identified to suggest granulomatous infection. Small left basilar pleural effusion is again noted. No pneumothorax. Cardiac size within normal limits. Right hilar enlargement again seen in keeping with right hilar adenopathy better seen on prior CT examination. IMPRESSION: Minimally improved right lung pulmonary infiltrate. Right basilar consolidation and associated small right pleural effusion again noted. Small left pleural effusion present. Enlargement of the right hilum related to adenopathy unchanged. Electronically Signed   By: Fidela Salisbury MD   On: 11/12/2020 06:29   DG CHEST PORT 1 VIEW  Result Date: 11/11/2020 CLINICAL DATA:  Hemoptysis.  Pericardial  effusion. EXAM: PORTABLE CHEST 1 VIEW COMPARISON:  11/10/2020.  CT 11/10/2020. FINDINGS: Mediastinum and right hilar fullness again noted consistent with previously visualized adenopathy. Right base infiltrate again noted. Reference is made to recent chest CT report for discussion of extensive findings throughout the chest. No prominent pleural effusion. No pneumothorax. Pericardial drain in stable position. Heart size stable. No pulmonary venous congestion. Prior cervical spine fusion. Reference is made to chest CT report for discussion of bone lesions present. IMPRESSION: 1. Mediastinal and right hilar fullness again noted consistent with previously visualized adenopathy. Right base infiltrate again noted. No interim change. 2. Pericardial drain in stable position. No pneumothorax. Heart size stable. No pulmonary venous congestion. Chest is unchanged from prior exam. Reference is made to recent chest CT report for discussion of extensive findings throughout the chest. Electronically Signed   By: Marcello Moores  Register   On: 11/11/2020 08:29   DG Chest Port 1 View  Result Date: 11/10/2020 CLINICAL DATA:  Pericardial effusion status post drainage. EXAM: PORTABLE CHEST 1 VIEW COMPARISON:  CT chest from same day.  Chest x-ray from yesterday. FINDINGS: New pericardial drain. Normal heart size. Normal pulmonary vascularity. Unchanged right paratracheal and hilar fullness related to underlying lymphadenopathy. Emphysematous changes again noted. New patchy airspace disease at the right lung base. No pleural effusion or pneumothorax. IMPRESSION: 1. New pericardial drain. 2. New patchy airspace disease at the right lung base, suspicious for aspiration. 3. Unchanged mediastinal and right hilar lymphadenopathy. Electronically Signed   By: Titus Dubin M.D.   On: 11/10/2020 20:54   ECHOCARDIOGRAM COMPLETE  Result Date: 11/10/2020    ECHOCARDIOGRAM REPORT   Patient Name:   Brian Leonard Date of Exam: 11/10/2020 Medical Rec #:   [id number]      Height:       71.0 in Accession #:    [id number]     Weight:       136.0 lb Date of Birth:  1961-08-09      BSA:          1.790 m Patient Age:    73 years       BP:           112/79 mmHg Patient Gender: M              HR:           110 bpm. Exam Location:  Inpatient Procedure: 2D Echo Indications:    pericardial effusion  History:        Patient has no prior history of Echocardiogram examinations.                 Signs/Symptoms:Shortness of Breath.  Sonographer:    Johny Chess Referring Phys: [id number] [place]  1. Left ventricular ejection fraction, by estimation, is 55 to 60%. The left ventricle has normal function. The left ventricle has no regional wall motion abnormalities. Indeterminate diastolic filling due to E-A fusion.  2. Right ventricular systolic function is normal. The right ventricular size is normal. The estimated right ventricular systolic pressure is 97.6 mmHg.  3. Moderate pericardial effusion. The pericardial effusion is circumferential. Findings are consistent with cardiac tamponade.  4. The mitral valve is normal in structure. No evidence of mitral valve regurgitation.  5. The aortic valve is tricuspid. Aortic valve regurgitation is not visualized. No aortic stenosis is present.  6. The inferior vena cava is dilated in size with <50% respiratory variability, suggesting right atrial pressure of 15 mmHg. FINDINGS  Left Ventricle: Left ventricular ejection fraction, by estimation, is 55 to 60%. The left ventricle has normal function. The left ventricle has no regional wall motion abnormalities. The left ventricular internal cavity size was normal in size. There is  no left ventricular hypertrophy. Indeterminate diastolic filling due to E-A fusion. Right Ventricle: The right ventricular size is normal. No increase in right ventricular wall thickness. Right ventricular systolic function is normal. The tricuspid regurgitant velocity is 1.57 m/s, and with an  assumed right atrial pressure of 15 mmHg, the estimated right ventricular systolic pressure is 73.4 mmHg. Left Atrium: Left atrial size was normal in size. Right Atrium: Right atrial size was normal in size. Pericardium: A moderately sized pericardial effusion is present. The pericardial effusion is circumferential. There is excessive respiratory variation in septal movement, diastolic collapse of the right ventricular free wall, diastolic collapse of the right atrial wall, excessive respiratory variation in the mitral valve spectral Doppler velocities and excessive respiratory variation in  the tricuspid valve spectral Doppler velocities. There is evidence of cardiac tamponade. Mitral Valve: The mitral valve is normal in structure. No evidence of mitral valve regurgitation. Tricuspid Valve: The tricuspid valve is normal in structure. Tricuspid valve regurgitation is not demonstrated. Aortic Valve: The aortic valve is tricuspid. Aortic valve regurgitation is not visualized. No aortic stenosis is present. Pulmonic Valve: The pulmonic valve was normal in structure. Pulmonic valve regurgitation is not visualized. Aorta: The aortic root is normal in size and structure. Venous: The inferior vena cava is dilated in size with less than 50% respiratory variability, suggesting right atrial pressure of 15 mmHg. IAS/Shunts: No atrial level shunt detected by color flow Doppler.  LEFT VENTRICLE PLAX 2D LVIDd:         4.20 cm Diastology LVIDs:         2.15 cm LV e' medial:    7.83 cm/s LV PW:         1.00 cm LV E/e' medial:  4.5 LV IVS:        0.90 cm LV e' lateral:   7.51 cm/s                        LV E/e' lateral: 4.7  RIGHT VENTRICLE             IVC RV S prime:     26.80 cm/s  IVC diam: 2.70 cm TAPSE (M-mode): 2.3 cm LEFT ATRIUM             Index       RIGHT ATRIUM          Index LA diam:        2.20 cm 1.23 cm/m  RA Area:     7.14 cm LA Vol (A2C):   16.3 ml 9.11 ml/m  RA Volume:   12.30 ml 6.87 ml/m LA Vol (A4C):   18.3  ml 10.22 ml/m LA Biplane Vol: 19.1 ml 10.67 ml/m  AORTIC VALVE LVOT Vmax:   66.60 cm/s LVOT Vmean:  44.300 cm/s LVOT VTI:    0.089 m  AORTA Ao Asc diam: 3.30 cm MV E velocity: 35.10 cm/s  TRICUSPID VALVE MV A velocity: 57.80 cm/s  TR Peak grad:   9.9 mmHg MV E/A ratio:  0.61        TR Vmax:        157.46 cm/s                             SHUNTS                            Systemic VTI: 0.09 m Dani Gobble Croitoru MD Electronically signed by Sanda Klein MD Signature Date/Time: 11/10/2020/6:07:36 PM    Final         Scheduled Meds: . Chlorhexidine Gluconate Cloth  6 each Topical Daily  . enoxaparin (LOVENOX) injection  40 mg Subcutaneous Daily  . insulin aspart  0-9 Units Subcutaneous TID WC  . ketorolac  15 mg Intravenous Q6H  . levalbuterol  2 puff Inhalation Q6H  . mouth rinse  15 mL Mouth Rinse BID  . nicotine  14 mg Transdermal Daily  . senna-docusate  1 tablet Oral BID   Continuous Infusions: . sodium chloride 50 mL/hr at 11/12/20 0605  . sodium chloride    . magnesium sulfate bolus IVPB       LOS:  2 days    Time spent: 35 minutes.     Elmarie Shiley, MD [facility]   If 7PM-7AM, please contact night-coverage [email or web address]  11/12/2020, 7:39 AM

## 2020-11-12 NOTE — Progress Notes (Addendum)
      [address]       [place] [address]             [phone]        2 Days Post-Op Procedure(s) (LRB): PERICARDIAL WINDOW (N/A)  Subjective: Patient tolerating clear liquids. He has complaints of pain by tube and incision sites  Objective: Vital signs in last 24 hours: Temp:  [97.9 F (36.6 C)-98.8 F (37.1 C)] 98 F (36.7 C) (03/10 0205) Pulse Rate:  [94-113] 109 (03/10 0330) Cardiac Rhythm: Normal sinus rhythm (03/10 0345) Resp:  [14-20] 19 (03/10 0330) BP: (112-136)/(80-95) 124/95 (03/10 0330) SpO2:  [92 %-97 %] 93 % (03/10 0330)   Current Weight  11/10/20 61.7 kg       Intake/Output from previous day: 03/09 0701 - 03/10 0700 In: 1623.1 [I.V.:1523.1; IV Piggyback:100] Out: 610 [Urine:550; Chest Tube:60]   Physical Exam:  Cardiovascular: Slightly tachycardic Pulmonary: Clear to auscultation bilaterally Wounds: Dressing is clean and dry.    Lab Results: CBC: Recent Labs    11/11/20 0416 11/12/20 0021  WBC 9.7 10.3  HGB 10.7* 10.3*  HCT 29.8* 29.3*  PLT 233 240   BMET:  Recent Labs    11/11/20 0416 11/12/20 0021  NA 133* 134*  K 4.1 3.5  CL 104 105  CO2 20* 22  GLUCOSE 127* 107*  BUN 9 9  CREATININE 0.68 0.81  CALCIUM 8.9 8.5*    PT/INR:  Lab Results  Component Value Date   INR 0.98 11/08/2015   ABG:  INR: Will add last result for INR, ABG once components are confirmed Will add last 4 CBG results once components are confirmed  Assessment/Plan:  1. CV - He went into a fib around 2 am. He was given IV Lopressor and potassium was supplemented as was 3.5. He is in ST with HR in the low 100 this am. Pericardial drain with 60 cc last 24 hours. Chest tube is to suction. CXR this am shows small b/l pleural effusions, no pneumothorax, right hilar enlargement (and adenopathy). Will remove drain once output with very little output. Pericardial effusion likely related to malignancy but await final cytology results 2.   Pulmonary - Heterogeneous, largely hypoattenuating focus in the tail of the pancreas measuring 2.5 x 2.1 cm, extensive centrally necrotic lymphadenopathy, emphysema, necrotic appearing mass in the superior segment right lower lobe.  Encourage incentive spirometer 3.  Anemia-H and H this am stable at 10.3 and 29.3 4. 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 Cyndia Bent, low dose Lovenox ok but would avoid full anticoagulation right now 5. Regarding pain control, will give a few doses of Toradol   Donielle M ZimmermanPA-C 11/12/2020,6:58 AM   Chart reviewed, patient examined, agree with above. I would keep the pericardial tube in until output is minimal to try to prevent recurrence. Cytology and path pending

## 2020-11-13 ENCOUNTER — Inpatient Hospital Stay (HOSPITAL_COMMUNITY): Payer: Medicaid Other

## 2020-11-13 ENCOUNTER — Other Ambulatory Visit: Payer: Self-pay

## 2020-11-13 ENCOUNTER — Other Ambulatory Visit: Payer: Self-pay | Admitting: Oncology

## 2020-11-13 DIAGNOSIS — I313 Pericardial effusion (noninflammatory): Secondary | ICD-10-CM

## 2020-11-13 DIAGNOSIS — R590 Localized enlarged lymph nodes: Secondary | ICD-10-CM

## 2020-11-13 DIAGNOSIS — I480 Paroxysmal atrial fibrillation: Secondary | ICD-10-CM

## 2020-11-13 DIAGNOSIS — I3139 Other pericardial effusion (noninflammatory): Secondary | ICD-10-CM

## 2020-11-13 DIAGNOSIS — R918 Other nonspecific abnormal finding of lung field: Secondary | ICD-10-CM

## 2020-11-13 LAB — BASIC METABOLIC PANEL
Anion gap: 9 (ref 5–15)
BUN: 10 mg/dL (ref 6–20)
CO2: 24 mmol/L (ref 22–32)
Calcium: 8.6 mg/dL — ABNORMAL LOW (ref 8.9–10.3)
Chloride: 99 mmol/L (ref 98–111)
Creatinine, Ser: 0.77 mg/dL (ref 0.61–1.24)
GFR, Estimated: >60 mL/min (ref >60)
Glucose, Bld: 101 mg/dL — ABNORMAL HIGH (ref 70–99)
Potassium: 3.9 mmol/L (ref 3.5–5.1)
Sodium: 132 mmol/L — ABNORMAL LOW (ref 135–145)

## 2020-11-13 LAB — QUANTIFERON-TB GOLD PLUS (RQFGPL)
QuantiFERON Mitogen Value: >10 IU/mL
QuantiFERON Nil Value: 0.05 IU/mL
QuantiFERON TB1 Ag Value: 0.04 IU/mL
QuantiFERON TB2 Ag Value: 0.06 IU/mL

## 2020-11-13 LAB — GLUCOSE, CAPILLARY
Glucose-Capillary: 106 mg/dL — ABNORMAL HIGH (ref 70–99)
Glucose-Capillary: 105 mg/dL — ABNORMAL HIGH (ref 70–99)
Glucose-Capillary: 117 mg/dL — ABNORMAL HIGH (ref 70–99)

## 2020-11-13 LAB — CBC
HCT: 34.4 % — ABNORMAL LOW (ref 39.0–52.0)
Hemoglobin: 12 g/dL — ABNORMAL LOW (ref 13.0–17.0)
MCH: 32.7 pg (ref 26.0–34.0)
MCHC: 34.9 g/dL (ref 30.0–36.0)
MCV: 93.7 fL (ref 80.0–100.0)
Platelets: 266 10*3/uL (ref 150–400)
RBC: 3.67 MIL/uL — ABNORMAL LOW (ref 4.22–5.81)
RDW: 12.9 % (ref 11.5–15.5)
WBC: 9.4 10*3/uL (ref 4.0–10.5)
nRBC: 0 % (ref 0.0–0.2)

## 2020-11-13 LAB — QUANTIFERON-TB GOLD PLUS: QuantiFERON-TB Gold Plus: NEGATIVE

## 2020-11-13 LAB — MAGNESIUM: Magnesium: 1.9 mg/dL (ref 1.7–2.4)

## 2020-11-13 MED ORDER — AMIODARONE LOAD VIA INFUSION
150.0000 mg | Freq: Once | INTRAVENOUS | Status: AC
  Administered 2020-11-13: 150 mg via INTRAVENOUS
  Filled 2020-11-13: qty 83.34

## 2020-11-13 MED ORDER — POTASSIUM CHLORIDE CRYS ER 20 MEQ PO TBCR
20.0000 meq | EXTENDED_RELEASE_TABLET | Freq: Two times a day (BID) | ORAL | Status: AC
  Administered 2020-11-13 (×2): 20 meq via ORAL
  Filled 2020-11-13 (×2): qty 1

## 2020-11-13 MED ORDER — KETOROLAC TROMETHAMINE 15 MG/ML IJ SOLN
15.0000 mg | Freq: Four times a day (QID) | INTRAMUSCULAR | Status: AC
  Administered 2020-11-13 – 2020-11-14 (×7): 15 mg via INTRAVENOUS
  Filled 2020-11-13 (×7): qty 1

## 2020-11-13 MED ORDER — AMIODARONE HCL IN DEXTROSE 360-4.14 MG/200ML-% IV SOLN
60.0000 mg/h | INTRAVENOUS | Status: DC
  Filled 2020-11-13: qty 200

## 2020-11-13 MED ORDER — AMIODARONE HCL IN DEXTROSE 360-4.14 MG/200ML-% IV SOLN
30.0000 mg/h | INTRAVENOUS | Status: AC
  Administered 2020-11-13: 30 mg/h via INTRAVENOUS
  Filled 2020-11-13 (×3): qty 200

## 2020-11-13 MED ORDER — METOPROLOL TARTRATE 5 MG/5ML IV SOLN
5.0000 mg | Freq: Four times a day (QID) | INTRAVENOUS | Status: DC | PRN
  Administered 2020-11-13 – 2020-11-18 (×3): 5 mg via INTRAVENOUS
  Filled 2020-11-13 (×2): qty 5

## 2020-11-13 MED ORDER — AMIODARONE HCL 200 MG PO TABS: 200.0000 mg | ORAL_TABLET | Freq: Every day | ORAL | Status: DC

## 2020-11-13 MED ORDER — AMIODARONE HCL IN DEXTROSE 360-4.14 MG/200ML-% IV SOLN
60.0000 mg/h | INTRAVENOUS | Status: AC
  Administered 2020-11-13 (×2): 60 mg/h via INTRAVENOUS
  Filled 2020-11-13: qty 200

## 2020-11-13 MED ORDER — METOPROLOL TARTRATE 25 MG PO TABS
25.0000 mg | ORAL_TABLET | Freq: Two times a day (BID) | ORAL | Status: DC
  Administered 2020-11-13 – 2020-11-19 (×12): 25 mg via ORAL
  Filled 2020-11-13 (×13): qty 1

## 2020-11-13 MED ORDER — AMIODARONE HCL IN DEXTROSE 360-4.14 MG/200ML-% IV SOLN
30.0000 mg/h | INTRAVENOUS | Status: DC
  Filled 2020-11-13: qty 200

## 2020-11-13 MED ORDER — METOPROLOL TARTRATE 5 MG/5ML IV SOLN
INTRAVENOUS | Status: AC
  Administered 2020-11-13: 5 mg
  Filled 2020-11-13: qty 5

## 2020-11-13 MED ORDER — AMIODARONE HCL 200 MG PO TABS
200.0000 mg | ORAL_TABLET | Freq: Two times a day (BID) | ORAL | Status: DC
  Administered 2020-11-14 – 2020-11-19 (×11): 200 mg via ORAL
  Filled 2020-11-13 (×11): qty 1

## 2020-11-13 NOTE — Progress Notes (Signed)
HEMATOLOGY-ONCOLOGY PROGRESS NOTE  SUBJECTIVE: Suspicious for metastatic lung cancer. Patient is a smoker who presented to the hospital with complaints of shortness of breath and CT scan revealed necrotic right lower lobe lung mass and pericardial tamponade with hilar and mediastinal lymphadenopathy.  There is a left brachiocephalic vein thrombus.  He underwent pericardial window surgery and is recovering from that.  Pathology is not finalized but is highly suspicious for lung cancer.  Initial CT of the chest suggested that there may be a lesion in the pancreas.  But the abdominal CT did not reveal any such finding.  Bone metastases were detected at T9 vertebral body and left ninth rib right seventh rib.   OBJECTIVE: REVIEW OF SYSTEMS:   Constitutional: Denies fevers, chills or abnormal weight loss Shortness of breath and chest discomfort  PHYSICAL EXAMINATION: ECOG PERFORMANCE STATUS: 2 - Symptomatic, <50% confined to bed  Vitals:   11/13/20 0754 11/13/20 1104  BP: (!) 129/94 110/86  Pulse: 92 97  Resp: 17 15  Temp: 98.9 F (37.2 C) 97.9 F (36.6 C)  SpO2: 94% 96%   Filed Weights   11/10/20 0317  Weight: 136 lb (61.7 kg)     LABORATORY DATA:  I have reviewed the data as listed CMP Latest Ref Rng & Units 11/13/2020 11/12/2020 11/11/2020  Glucose 70 - 99 mg/dL 101(H) 107(H) 127(H)  BUN 6 - 20 mg/dL 10 9 9   Creatinine 0.61 - 1.24 mg/dL 0.77 0.81 0.68  Sodium 135 - 145 mmol/L 132(L) 134(L) 133(L)  Potassium 3.5 - 5.1 mmol/L 3.9 3.5 4.1  Chloride 98 - 111 mmol/L 99 105 104  CO2 22 - 32 mmol/L 24 22 20(L)  Calcium 8.9 - 10.3 mg/dL 8.6(L) 8.5(L) 8.9  Total Protein 6.5 - 8.1 g/dL - 5.0(L) -  Total Bilirubin 0.3 - 1.2 mg/dL - 0.6 -  Alkaline Phos 38 - 126 U/L - 67 -  AST 15 - 41 U/L - 21 -  ALT 0 - 44 U/L - 15 -    Lab Results  Component Value Date   WBC 9.4 11/13/2020   HGB 12.0 (L) 11/13/2020   HCT 34.4 (L) 11/13/2020   MCV 93.7 11/13/2020   PLT 266 11/13/2020   NEUTROABS  5.9 11/09/2020    ASSESSMENT AND PLAN: 1.  Clinical suspicion for lung cancer with right lower lobe lung mass and extensive hilar, mediastinal, supraclavicular lymphadenopathy and pericardial tamponade status post window placement.  Bone lytic lesions also detected suspicious for metastatic lung cancer.  There are still lots of tests and stains that need to be performed to get to the final diagnosis.  2. I discussed with the patient extensively that metastatic lung cancer can be treated with it cannot be cured.  I also discussed with him that he will be seeing Dr. Julien Nordmann as an outpatient for discussion regarding his treatment options.  As an outpatient he will undergo PET CT scan. He needs a brain MRI as well.  However he had neck surgery and screws and plates were inserted and therefore I am not sure if he can undergo a brain MRI.

## 2020-11-13 NOTE — Progress Notes (Signed)
PROGRESS NOTE    Brian Leonard  WLN:[id number] DOB: [birth year] DOA: 11/09/2020 PCP: Patient, No Pcp Per   Brief Narrative: 60 year old with past medical history significant for tobacco abuse, presents complaining of back pain, chest pain and cough.  Patient reports some chronic back pain but worse over the last month.  Is also complaining of chest pain.  He report productive sputum.  He continues to smoke 7 cigarettes/day.  Evaluation in the ED patient was afebrile slight tachypneic, tachycardic heart rate in the 110s blood pressure stable.  CT chest concerning for extensive necrotic adenopathy, necrotic right lower lobe mass, numerous scattered micronodules throughout bilateral lungs, pericardial effusion, bone lesions involving T9 and bilateral ribs, suspected thrombus in the left brachiocephalic vein, partially visualized pancreatic tail lesion subone other finding.  2D echo showed pericardial effusion with signs of tamponade.  Patient was taken to the OR emergently on 3/8 for pericardial window and drainage.  Patient develops recurrence of A fib RVR 3/11. Received IV metoprolol. He was started on IV amiodarone.   Assessment & Plan:   Principal Problem:   Pericardial effusion Active Problems:   Brachiocephalic vein thrombosis (HCC)   Multiple pulmonary nodules   Lymphadenopathy, thoracic   Right lower lobe lung mass   Thyroid nodule   Liver lesion, right lobe   Lesion of pancreas   Lesion of thoracic vertebra   Rib lesion  1-Pericardial effusion, tamponade: -He was tachycardic, with low blood pressure.  2D echo confirmed pericardial effusion with tamponade. -CVTS was consulted, patient underwent pericardial window done 3/8, yielding bloody fluid. -Cytology Biopsy still pending. -Continue with pericardial drain, 100 cc documented.  -Appreciate cardiology and cardiothoracic surgery.  2-Lung mass, necrotic lymphadenopathy, bone lesions, pancreatic tail lesion. -Patient presents  with history of increased cough, pleuritic pain, back pain found to have on CT chest extensive necrotic lymphadenopathy, necrotic right lower lobe mass, numerous bilateral nodules, lucent T9 lesion with soft tissue extension, left ninth rib and right 7 rib lesions, hyper attenuating focus in the right lobe of liver, partially visualized pancreatic tail lesion. -ConcernPrimary pulmonary malignancy with widespread metastasis, pancreatic primary or less likely atypical infection. -Cytology from pericardial fluid biopsy, pending. Will call pathology today.  -Depending  on results may need oncology versus pulmonologist evaluation -AFB ordered and Quantiferon pending.   3-Brachiocephalic thrombosis: [name] left brachiocephalic vein thrombus noted on CT -Was initially started on IV heparin.  Hold postop.  Plan to continue to hold anticoagulation postsurgery due to hemoptysis and bloody drainage from pericardial window.  -on Low dose Lovenox, prophylaxis.   4-Thyroid nodule: He will need ultrasound for follow-up.  5-Tobacco abuse: Motivated to quit, continue with nicotine patch 6-Mild hyponatremia: Monitor. Stable.  7-Hemoptysis; related to lung mass, was on anticoagulation. Holding heparin. Hb stable. No further hemoptysis.  8-A fib RVR; Recurrence of A fib RVR, Received IV metoprolol last night. Plan to give 5 mg IV metoprolol now. Start oral metoprolol as well. Started on Amiodarone Gtt by Cardiology    Estimated body mass index is 18.97 kg/m as calculated from the following:   Height as of this encounter: 5\' 11"  (1.803 m).   Weight as of this encounter: 61.7 kg.   DVT prophylaxis: Lovenox Code Status: Full code Family Communication: Care discussed with patient Disposition Plan:  Status is: Inpatient  Remains inpatient appropriate because:Ongoing diagnostic testing needed not appropriate for outpatient work up   Dispo:  Patient From: Home  Planned Disposition: Home  Medically stable  for discharge: No  Consultants:   Cardiothoracic surgery  Cardiology  Procedures:   Pericardial window 3/8  Antimicrobials:    Subjective: He is still having uncontrolled pain. Site of drain. Pain med helps some.  He is in A fib RVR.   Objective: Vitals:   11/12/20 1937 11/12/20 1948 11/12/20 2340 11/13/20 0342  BP:  (!) 138/99 130/88 (!) 165/99  Pulse:  (!) 108 100 100  Resp:  18 15 15   Temp:  98.1 F (36.7 C) 98.9 F (37.2 C) 98.1 F (36.7 C)  TempSrc:  Oral Oral Oral  SpO2: 96% 97% 100% 93%  Weight:      Height:        Intake/Output Summary (Last 24 hours) at 11/13/2020 0734 Last data filed at 11/13/2020 0643 Gross per 24 hour  Intake -  Output 1945 ml  Net -1945 ml   Filed Weights   11/10/20 0317  Weight: 61.7 kg    Examination:  General exam: NAD Respiratory system: BL air movement.  Cardiovascular system: S 1, S 2 IRR Subxiphoid drain in place bloody fluid Gastrointestinal system: BS present, soft, nt Central nervous system: Alert Extremities: No edema  Data Reviewed: I have personally reviewed following labs and imaging studies  CBC: Recent Labs  Lab 11/09/20 2242 11/10/20 1055 11/10/20 1952 11/11/20 0416 11/12/20 0021  WBC 8.1 6.7  --  9.7 10.3  NEUTROABS 5.9  --   --   --   --   HGB 12.2* 11.7* 8.5* 10.7* 10.3*  HCT 35.3* 35.9* 25.0* 29.8* 29.3*  MCV 95.9 98.9  --  93.4 93.9  PLT 288 268  --  233 124   Basic Metabolic Panel: Recent Labs  Lab 11/09/20 2242 11/10/20 0408 11/10/20 1952 11/11/20 0416 11/12/20 0021 11/12/20 0054  NA 138 137 136 133* 134*  --   K 3.6 3.5 4.0 4.1 3.5  --   CL 107 106  --  104 105  --   CO2 21* 21*  --  20* 22  --   GLUCOSE 122* 174*  --  127* 107*  --   BUN 14 13  --  9 9  --   CREATININE 0.79 0.80  --  0.68 0.81  --   CALCIUM 9.0 8.9  --  8.9 8.5*  --   MG  --   --   --   --   --  1.7   GFR: Estimated Creatinine Clearance: 86.8 mL/min (by C-G formula based on SCr of 0.81  mg/dL). Liver Function Tests: Recent Labs  Lab 11/09/20 2242 11/12/20 0021  AST 20 21  ALT 18 15  ALKPHOS 93 67  BILITOT 0.8 0.6  PROT 6.1* 5.0*  ALBUMIN 3.2* 2.6*   Recent Labs  Lab 11/10/20 0114  LIPASE 68*   No results for input(s): AMMONIA in the last 168 hours. Coagulation Profile: No results for input(s): INR, PROTIME in the last 168 hours. Cardiac Enzymes: No results for input(s): CKTOTAL, CKMB, CKMBINDEX, TROPONINI in the last 168 hours. BNP (last 3 results) No results for input(s): PROBNP in the last 8760 hours. HbA1C: Recent Labs    11/11/20 0550  HGBA1C 5.9*   CBG: Recent Labs  Lab 11/12/20 0626 11/12/20 1239 11/12/20 1732 11/12/20 2151 11/13/20 0642  GLUCAP 90 91 111* 102* 106*   Lipid Profile: No results for input(s): CHOL, HDL, LDLCALC, TRIG, CHOLHDL, LDLDIRECT in the last 72 hours. Thyroid Function Tests: Recent Labs    11/11/20 0600  TSH 0.672  Anemia Panel: No results for input(s): VITAMINB12, FOLATE, FERRITIN, TIBC, IRON, RETICCTPCT in the last 72 hours. Sepsis Labs: No results for input(s): PROCALCITON, LATICACIDVEN in the last 168 hours.  Recent Results (from the past 240 hour(s))  Resp Panel by RT-PCR (Flu A&B, Covid) Nasopharyngeal Swab     Status: None   Collection Time: 11/10/20  1:55 AM   Specimen: Nasopharyngeal Swab; Nasopharyngeal(NP) swabs in vial transport medium  Result Value Ref Range Status   SARS Coronavirus 2 by RT PCR NEGATIVE NEGATIVE Final    Comment: (NOTE) SARS-CoV-2 target nucleic acids are NOT DETECTED.  The SARS-CoV-2 RNA is generally detectable in upper respiratory specimens during the acute phase of infection. The lowest concentration of SARS-CoV-2 viral copies this assay can detect is 138 copies/mL. A negative result does not preclude SARS-Cov-2 infection and should not be used as the sole basis for treatment or other patient management decisions. A negative result may occur with  improper specimen  collection/handling, submission of specimen other than nasopharyngeal swab, presence of viral mutation(s) within the areas targeted by this assay, and inadequate number of viral copies(<138 copies/mL). A negative result must be combined with clinical observations, patient history, and epidemiological information. The expected result is Negative.  Fact Sheet for Patients:  EntrepreneurPulse.com.au  Fact Sheet for Healthcare Providers:  IncredibleEmployment.be  This test is no t yet approved or cleared by the [place] FDA and  has been authorized for detection and/or diagnosis of SARS-CoV-2 by FDA under an Emergency Use Authorization (EUA). This EUA will remain  in effect (meaning this test can be used) for the duration of the COVID-19 declaration under Section 564(b)(1) of the Act, 21 U.S.C.section 360bbb-3(b)(1), unless the authorization is terminated  or revoked sooner.       Influenza A by PCR NEGATIVE NEGATIVE Final   Influenza B by PCR NEGATIVE NEGATIVE Final    Comment: (NOTE) The Xpert Xpress SARS-CoV-2/FLU/RSV plus assay is intended as an aid in the diagnosis of influenza from Nasopharyngeal swab specimens and should not be used as a sole basis for treatment. Nasal washings and aspirates are unacceptable for Xpert Xpress SARS-CoV-2/FLU/RSV testing.  Fact Sheet for Patients: EntrepreneurPulse.com.au  Fact Sheet for Healthcare Providers: IncredibleEmployment.be  This test is not yet approved or cleared by the [place] FDA and has been authorized for detection and/or diagnosis of SARS-CoV-2 by FDA under an Emergency Use Authorization (EUA). This EUA will remain in effect (meaning this test can be used) for the duration of the COVID-19 declaration under Section 564(b)(1) of the Act, 21 U.S.C. section 360bbb-3(b)(1), unless the authorization is terminated or revoked.  Performed at [facility], [place] [address] [place], [place] [address]   MRSA PCR Screening     Status: None   Collection Time: 11/11/20  2:28 AM   Specimen: Nasal Mucosa; Nasopharyngeal  Result Value Ref Range Status   MRSA by PCR NEGATIVE NEGATIVE Final    Comment:        The GeneXpert MRSA Assay (FDA approved for NASAL specimens only), is one component of a comprehensive MRSA colonization surveillance program. It is not intended to diagnose MRSA infection nor to guide or monitor treatment for MRSA infections. Performed at [facility], [place] [address], [place],  [address]          Radiology Studies: DG CHEST PORT 1 VIEW  Result Date: 11/12/2020 CLINICAL DATA:  Exudative pericardial effusion status post pericardial window EXAM: PORTABLE CHEST 1 VIEW COMPARISON:  11/11/2020 FINDINGS: Right basilar consolidation and associated small right pleural effusion is stable. Mild interval improvement in interstitial infiltrate within the right upper lobe. No typical biapical cicatricial change or cavitary lesions identified to suggest granulomatous infection. Small left basilar pleural effusion is again noted. No pneumothorax. Cardiac size within normal limits. Right hilar enlargement again seen in keeping with right hilar adenopathy better seen on prior CT examination. IMPRESSION: Minimally improved right lung pulmonary infiltrate. Right basilar consolidation and associated small right pleural effusion again noted. Small left pleural effusion present. Enlargement of the right hilum related to adenopathy unchanged. Electronically Signed   By: Fidela Salisbury MD   On: 11/12/2020 06:29   DG CHEST PORT 1 VIEW  Result Date: 11/11/2020 CLINICAL DATA:  Hemoptysis.  Pericardial effusion. EXAM: PORTABLE CHEST 1 VIEW COMPARISON:  11/10/2020.  CT 11/10/2020. FINDINGS: Mediastinum and right hilar fullness again noted consistent with previously visualized adenopathy. Right base infiltrate again noted.  Reference is made to recent chest CT report for discussion of extensive findings throughout the chest. No prominent pleural effusion. No pneumothorax. Pericardial drain in stable position. Heart size stable. No pulmonary venous congestion. Prior cervical spine fusion. Reference is made to chest CT report for discussion of bone lesions present. IMPRESSION: 1. Mediastinal and right hilar fullness again noted consistent with previously visualized adenopathy. Right base infiltrate again noted. No interim change. 2. Pericardial drain in stable position. No pneumothorax. Heart size stable. No pulmonary venous congestion. Chest is unchanged from prior exam. Reference is made to recent chest CT report for discussion of extensive findings throughout the chest. Electronically Signed   By: Marcello Moores  Register   On: 11/11/2020 08:29        Scheduled Meds: . Chlorhexidine Gluconate Cloth  6 each Topical Daily  . enoxaparin (LOVENOX) injection  40 mg Subcutaneous Daily  . insulin aspart  0-9 Units Subcutaneous TID WC  . ketorolac  15 mg Intravenous Q6H  . mouth rinse  15 mL Mouth Rinse BID  . nicotine  14 mg Transdermal Daily  . pantoprazole (PROTONIX) IV  40 mg Intravenous Q12H  . senna-docusate  1 tablet Oral BID   Continuous Infusions: . sodium chloride       LOS: 3 days    Time spent: 35 minutes.     Elmarie Shiley, MD [facility]   If 7PM-7AM, please contact night-coverage [email or web address]  11/13/2020, 7:34 AM

## 2020-11-13 NOTE — Progress Notes (Addendum)
Brief Oncology Note:  Request for consult received.  The patient's chart has been reviewed including CT scan results.  Based on CT scans, the patient appears to have widespread malignancy concerning for metastatic lung cancer.  The patient is status post pericardial window.  Surgical pathology and cytology are still pending.  I met with the patient and his hospital room.  I discussed CT scan results with the patient and discussed that the biopsy results are still pending.  He is aware that we need this information to have a formal discussion regarding treatment options.  I have sent a scheduling message to the cancer to arrange for a new patient appointment for Brian Leonard at the end of next week or early in the week of 3/21.  I have also alerted the nurse navigator is to follow-up to be sure the appointment is scheduled.  Please call medical oncology if additional questions arise while he is in the hospital.  Brian Bussing, DNP, AGPCNP-BC, AOCNP  Attending Note  I personally saw the patient, reviewed the chart and examined the patient. The plan of care was discussed with the patient and the admitting team. I agree with the assessment and plan as documented above. Thank you very much for the consultation. Please see my note for complete details.  I performed the bulk of the assessment and plan

## 2020-11-13 NOTE — Progress Notes (Signed)
  Echocardiogram Echocardiogram Transesophageal has been performed.  Brian Leonard 11/13/2020, 1:39 PM

## 2020-11-13 NOTE — Plan of Care (Signed)

## 2020-11-13 NOTE — Progress Notes (Incomplete)
Progress Note  Patient Name: Brian Leonard Date of Encounter: 11/13/2020  [facility] Cardiologist: No primary care provider on file. ***  Subjective   ***  Inpatient Medications    Scheduled Meds: . [START ON 11/14/2020] amiodarone  200 mg Oral Q12H   Followed by  . [START ON 22-Nov-2020] amiodarone  200 mg Oral Daily  . Chlorhexidine Gluconate Cloth  6 each Topical Daily  . enoxaparin (LOVENOX) injection  40 mg Subcutaneous Daily  . insulin aspart  0-9 Units Subcutaneous TID WC  . ketorolac  15 mg Intravenous Q6H  . mouth rinse  15 mL Mouth Rinse BID  . metoprolol tartrate  25 mg Oral BID  . nicotine  14 mg Transdermal Daily  . pantoprazole (PROTONIX) IV  40 mg Intravenous Q12H  . potassium chloride  20 mEq Oral BID  . senna-docusate  1 tablet Oral BID   Continuous Infusions: . sodium chloride    . amiodarone 30 mg/hr (11/13/20 1526)   PRN Meds: Place/Maintain arterial line **AND** sodium chloride, diphenhydrAMINE, guaiFENesin, HYDROmorphone, levalbuterol, metoprolol tartrate, ondansetron **OR** ondansetron (ZOFRAN) IV, oxyCODONE-acetaminophen, simethicone, traMADol   Vital Signs    Vitals:   11/12/20 2340 11/13/20 0342 11/13/20 0754 11/13/20 1104  BP: 130/88 (!) 165/99 (!) 129/94 110/86  Pulse: 100 100 92 97  Resp: 15 15 17 15   Temp: 98.9 F (37.2 C) 98.1 F (36.7 C) 98.9 F (37.2 C) 97.9 F (36.6 C)  TempSrc: Oral Oral Oral Oral  SpO2: 100% 93% 94% 96%  Weight:      Height:        Intake/Output Summary (Last 24 hours) at 11/13/2020 1929 Last data filed at 11/13/2020 1500 Gross per 24 hour  Intake 139.22 ml  Output 600 ml  Net -460.78 ml   Last 3 Weights 11/10/2020 08/26/2015  Weight (lbs) 136 lb 135 lb  Weight (kg) 61.689 kg 61.236 kg      Telemetry    *** - Personally Reviewed  ECG    *** - Personally Reviewed  Physical Exam  *** GEN: No acute distress.   Neck: No JVD Cardiac: RRR, no murmurs, rubs, or gallops.  Respiratory: Clear to  auscultation bilaterally. GI: Soft, nontender, non-distended  MS: No edema; No deformity. Neuro:  Nonfocal  Psych: Normal affect   Labs    High Sensitivity Troponin:   Recent Labs  Lab 11/10/20 0114 11/10/20 0304  TROPONINIHS 16 16      Chemistry Recent Labs  Lab 11/09/20 2242 11/10/20 0408 11/11/20 0416 11/12/20 0021 11/13/20 0828  NA 138   < > 133* 134* 132*  K 3.6   < > 4.1 3.5 3.9  CL 107   < > 104 105 99  CO2 21*   < > 20* 22 24  GLUCOSE 122*   < > 127* 107* 101*  BUN 14   < > 9 9 10   CREATININE 0.79   < > 0.68 0.81 0.77  CALCIUM 9.0   < > 8.9 8.5* 8.6*  PROT 6.1*  --   --  5.0*  --   ALBUMIN 3.2*  --   --  2.6*  --   AST 20  --   --  21  --   ALT 18  --   --  15  --   ALKPHOS 93  --   --  67  --   BILITOT 0.8  --   --  0.6  --   GFRNONAA >60   < > >  60 >60 >60  ANIONGAP 10   < > 9 7 9    < > = values in this interval not displayed.     Hematology Recent Labs  Lab 11/11/20 0416 11/12/20 0021 11/13/20 0828  WBC 9.7 10.3 9.4  RBC 3.19* 3.12* 3.67*  HGB 10.7* 10.3* 12.0*  HCT 29.8* 29.3* 34.4*  MCV 93.4 93.9 93.7  MCH 33.5 33.0 32.7  MCHC 35.9 35.2 34.9  RDW 13.2 13.2 12.9  PLT 233 240 266    BNPNo results for input(s): BNP, PROBNP in the last 168 hours.   DDimer No results for input(s): DDIMER in the last 168 hours.   Radiology    DG CHEST PORT 1 VIEW  Result Date: 11/13/2020 CLINICAL DATA:  Pericardial effusion EXAM: PORTABLE CHEST 1 VIEW COMPARISON:  November 12, 2020 FINDINGS: Apparent pericardial drain present, unchanged in position. Heart size within normal limits. Pulmonary vascularity within normal limits. There are pleural effusions bilaterally with consolidation in the left lower lobe. There is apparent bullous disease in the left apex region. No adenopathy evident. Postoperative change lower cervical region. IMPRESSION: Apparent pericardial drain present. Heart size within normal limits. Pleural effusions bilaterally. Left lower lobe  consolidation may in part be due to atelectasis; a degree of superimposed pneumonia in the left lower lobe cannot be excluded. Bullous disease noted left apex. Electronically Signed   By: Lowella Grip III M.D.   On: 11/13/2020 08:32   DG CHEST PORT 1 VIEW  Result Date: 11/12/2020 CLINICAL DATA:  Exudative pericardial effusion status post pericardial window EXAM: PORTABLE CHEST 1 VIEW COMPARISON:  11/11/2020 FINDINGS: Right basilar consolidation and associated small right pleural effusion is stable. Mild interval improvement in interstitial infiltrate within the right upper lobe. No typical biapical cicatricial change or cavitary lesions identified to suggest granulomatous infection. Small left basilar pleural effusion is again noted. No pneumothorax. Cardiac size within normal limits. Right hilar enlargement again seen in keeping with right hilar adenopathy better seen on prior CT examination. IMPRESSION: Minimally improved right lung pulmonary infiltrate. Right basilar consolidation and associated small right pleural effusion again noted. Small left pleural effusion present. Enlargement of the right hilum related to adenopathy unchanged. Electronically Signed   By: Fidela Salisbury MD   On: 11/12/2020 06:29    Cardiac Studies   ECHO 11/10/2020  1. Left ventricular ejection fraction, by estimation, is 55 to 60%. The  left ventricle has normal function. The left ventricle has no regional  wall motion abnormalities. Indeterminate diastolic filling due to E-A  fusion.  2. Right ventricular systolic function is normal. The right ventricular  size is normal. The estimated right ventricular systolic pressure is 42.7  mmHg.  3. Moderate pericardial effusion. The pericardial effusion is  circumferential. Findings are consistent with cardiac tamponade.  4. The mitral valve is normal in structure. No evidence of mitral valve  regurgitation.  5. The aortic valve is tricuspid. Aortic valve  regurgitation is not  visualized. No aortic stenosis is present.  6. The inferior vena cava is dilated in size with <50% respiratory  variability, suggesting right atrial pressure of 15 mmHg.   Patient Profile     60 y.o. male smoker presents with pericardial tamponade, necrotic lung mass RLL, widespread necrotic chest lymphadenopathy, lesion in tail of pancreas, multiple bony lesions suggestive of metastatic spread, left brachiocephalic vein thrombosis, now day #4 s/p pericardial window (350 ml bloody fluid) with recurrent episodes of AFib w RVR  Assessment & Plan    #  Pericardial tamponade: strong suspicion for malignancy, likely lung. Path still pending, have placed a call to pathology. CT left in longer to promote adhesions and lessen chance of recurrent tamponade.  #AFib:in setting of acute pericarditis, chest tube. Appears likely to keep recurring. Load amio and start oral beta blockers. 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 He has not had any additional hemoptysis since day #1 postop - maybe that was post intubation tracheal irritation, but leading diagnostic suspicion remains bronchogenic carcinoma.  #Left brachiocephalic vein thrombosis:not a candidate for anticoagulation at this time.     {Are we signing off today?:[id number]}  For questions or updates, please contact [place] Please consult [email or web address] for contact info under        Signed, Freada Bergeron, MD  11/13/2020, 7:29 PM

## 2020-11-13 NOTE — Progress Notes (Signed)
Progress Note  Patient Name: Brian Leonard Date of Encounter: 11/13/2020  [facility] Cardiologist: No primary care provider on file. New  Subjective   Fair pain control. Unaware of arrhythmia (AFib w RVR 140s for last 1 hour or so)  Inpatient Medications    Scheduled Meds: . amiodarone  150 mg Intravenous Once  . [START ON 11/14/2020] amiodarone  200 mg Oral Q12H   Followed by  . [START ON 12-18-2020] amiodarone  200 mg Oral Daily  . Chlorhexidine Gluconate Cloth  6 each Topical Daily  . enoxaparin (LOVENOX) injection  40 mg Subcutaneous Daily  . insulin aspart  0-9 Units Subcutaneous TID WC  . ketorolac  15 mg Intravenous Q6H  . mouth rinse  15 mL Mouth Rinse BID  . metoprolol tartrate  25 mg Oral BID  . nicotine  14 mg Transdermal Daily  . pantoprazole (PROTONIX) IV  40 mg Intravenous Q12H  . potassium chloride  20 mEq Oral BID  . senna-docusate  1 tablet Oral BID   Continuous Infusions: . sodium chloride    . amiodarone     Followed by  . amiodarone     PRN Meds: Place/Maintain arterial line **AND** sodium chloride, diphenhydrAMINE, guaiFENesin, HYDROmorphone, levalbuterol, metoprolol tartrate, ondansetron **OR** ondansetron (ZOFRAN) IV, oxyCODONE-acetaminophen, simethicone, traMADol   Vital Signs    Vitals:   11/12/20 1948 11/12/20 2340 11/13/20 0342 11/13/20 0754  BP: (!) 138/99 130/88 (!) 165/99 (!) 129/94  Pulse: (!) 108 100 100 92  Resp: 18 15 15 17   Temp: 98.1 F (36.7 C) 98.9 F (37.2 C) 98.1 F (36.7 C) 98.9 F (37.2 C)  TempSrc: Oral Oral Oral Oral  SpO2: 97% 100% 93% 94%  Weight:      Height:        Intake/Output Summary (Last 24 hours) at 11/13/2020 0904 Last data filed at 11/13/2020 4008 Gross per 24 hour  Intake --  Output 1545 ml  Net -1545 ml   Last 3 Weights 11/10/2020 08/26/2015  Weight (lbs) 136 lb 135 lb  Weight (kg) 61.689 kg 61.236 kg      Telemetry    AFib w RVR since 0800h - Personally Reviewed  ECG    No new  tracing - Personally Reviewed  Physical Exam  Does not appear more uncomfortable than before arrhythmia onset GEN: No acute distress.   Neck: No JVD Cardiac: rapid irregular rhythm, no murmurs, rubs, or gallops.  Respiratory: Clear to auscultation bilaterally anteriorly. GI: Soft, nontender, non-distended  MS: No edema; No deformity. Neuro:  Nonfocal  Psych: Normal affect   Labs    High Sensitivity Troponin:   Recent Labs  Lab 11/10/20 0114 11/10/20 0304  TROPONINIHS 16 16      Chemistry Recent Labs  Lab 11/09/20 2242 11/10/20 0408 11/10/20 1952 11/11/20 0416 11/12/20 0021  NA 138 137 136 133* 134*  K 3.6 3.5 4.0 4.1 3.5  CL 107 106  --  104 105  CO2 21* 21*  --  20* 22  GLUCOSE 122* 174*  --  127* 107*  BUN 14 13  --  9 9  CREATININE 0.79 0.80  --  0.68 0.81  CALCIUM 9.0 8.9  --  8.9 8.5*  PROT 6.1*  --   --   --  5.0*  ALBUMIN 3.2*  --   --   --  2.6*  AST 20  --   --   --  21  ALT 18  --   --   --  15  ALKPHOS 93  --   --   --  67  BILITOT 0.8  --   --   --  0.6  GFRNONAA >60 >60  --  >60 >60  ANIONGAP 10 10  --  9 7     Hematology Recent Labs  Lab 11/11/20 0416 11/12/20 0021 11/13/20 0828  WBC 9.7 10.3 9.4  RBC 3.19* 3.12* 3.67*  HGB 10.7* 10.3* 12.0*  HCT 29.8* 29.3* 34.4*  MCV 93.4 93.9 93.7  MCH 33.5 33.0 32.7  MCHC 35.9 35.2 34.9  RDW 13.2 13.2 12.9  PLT 233 240 266    BNPNo results for input(s): BNP, PROBNP in the last 168 hours.   DDimer No results for input(s): DDIMER in the last 168 hours.   Radiology    DG CHEST PORT 1 VIEW  Result Date: 11/13/2020 CLINICAL DATA:  Pericardial effusion EXAM: PORTABLE CHEST 1 VIEW COMPARISON:  November 12, 2020 FINDINGS: Apparent pericardial drain present, unchanged in position. Heart size within normal limits. Pulmonary vascularity within normal limits. There are pleural effusions bilaterally with consolidation in the left lower lobe. There is apparent bullous disease in the left apex region. No  adenopathy evident. Postoperative change lower cervical region. IMPRESSION: Apparent pericardial drain present. Heart size within normal limits. Pleural effusions bilaterally. Left lower lobe consolidation may in part be due to atelectasis; a degree of superimposed pneumonia in the left lower lobe cannot be excluded. Bullous disease noted left apex. Electronically Signed   By: Lowella Grip III M.D.   On: 11/13/2020 08:32   DG CHEST PORT 1 VIEW  Result Date: 11/12/2020 CLINICAL DATA:  Exudative pericardial effusion status post pericardial window EXAM: PORTABLE CHEST 1 VIEW COMPARISON:  11/11/2020 FINDINGS: Right basilar consolidation and associated small right pleural effusion is stable. Mild interval improvement in interstitial infiltrate within the right upper lobe. No typical biapical cicatricial change or cavitary lesions identified to suggest granulomatous infection. Small left basilar pleural effusion is again noted. No pneumothorax. Cardiac size within normal limits. Right hilar enlargement again seen in keeping with right hilar adenopathy better seen on prior CT examination. IMPRESSION: Minimally improved right lung pulmonary infiltrate. Right basilar consolidation and associated small right pleural effusion again noted. Small left pleural effusion present. Enlargement of the right hilum related to adenopathy unchanged. Electronically Signed   By: Fidela Salisbury MD   On: 11/12/2020 06:29    Cardiac Studies   ECHO 11/10/2020  1. Left ventricular ejection fraction, by estimation, is 55 to 60%. The  left ventricle has normal function. The left ventricle has no regional  wall motion abnormalities. Indeterminate diastolic filling due to E-A  fusion.  2. Right ventricular systolic function is normal. The right ventricular  size is normal. The estimated right ventricular systolic pressure is 56.4  mmHg.  3. Moderate pericardial effusion. The pericardial effusion is  circumferential.  Findings are consistent with cardiac tamponade.  4. The mitral valve is normal in structure. No evidence of mitral valve  regurgitation.  5. The aortic valve is tricuspid. Aortic valve regurgitation is not  visualized. No aortic stenosis is present.  6. The inferior vena cava is dilated in size with <50% respiratory  variability, suggesting right atrial pressure of 15 mmHg.   Patient Profile     60 y.o. male smoker presents with pericardial tamponade, necrotic lung mass RLL, widespread necrotic chest lymphadenopathy, lesion in tail of pancreas, multiple bony lesions suggestive of metastatic spread, left brachiocephalic vein thrombosis, now day #3  s/p pericardial window (350 ml bloody fluid) with recurrent episodes of AFib w RVR  Assessment & Plan    1. Pericardial tamponade: strong suspicion for malignancy, likely lung. Path still pending, have placed a call to pathology. CT left in longer to promote adhesions and lessen chance of recurrent tamponade. 2. AFib: in setting of acute pericarditis, chest tube. Appears likely to keep recurring. Load amio and start oral beta blockers. No anticoagulation, at least not until CXR removed 3. 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 He has not had any additional hemoptysis since day #1 postop - maybe that was post intubation tracheal irritation, but leading diagnostic suspicion remains bronchogenic carcinoma. 4. 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     For questions or updates, please contact [place] Please consult [email or web address] for contact info under        Signed, Sanda Klein, MD  11/13/2020, 9:04 AM

## 2020-11-13 NOTE — Progress Notes (Addendum)
      [address]       [place] [address]             [phone]        3 Days Post-Op Procedure(s) (LRB): PERICARDIAL WINDOW (N/A)  Subjective: Patient states Toradol has helped with pain  Objective: Vital signs in last 24 hours: Temp:  [98.1 F (36.7 C)-98.9 F (37.2 C)] 98.1 F (36.7 C) (03/11 0342) Pulse Rate:  [94-109] 100 (03/11 0342) Cardiac Rhythm: Normal sinus rhythm (03/11 0342) Resp:  [14-20] 15 (03/11 0342) BP: (127-165)/(88-102) 165/99 (03/11 0342) SpO2:  [93 %-100 %] 93 % (03/11 0342)   Current Weight  11/10/20 61.7 kg       Intake/Output from previous day: 03/10 0701 - 03/11 0700 In: -  Out: 1945 [Urine:1845; Chest Tube:100]   Physical Exam:  Cardiovascular: Slightly tachycardic Pulmonary: Slightly diminished bibasilar breath sounds Wounds: Clean and dry.    Lab Results: CBC: Recent Labs    11/11/20 0416 11/12/20 0021  WBC 9.7 10.3  HGB 10.7* 10.3*  HCT 29.8* 29.3*  PLT 233 240   BMET:  Recent Labs    11/11/20 0416 11/12/20 0021  NA 133* 134*  K 4.1 3.5  CL 104 105  CO2 20* 22  GLUCOSE 127* 107*  BUN 9 9  CREATININE 0.68 0.81  CALCIUM 8.9 8.5*    PT/INR:  Lab Results  Component Value Date   INR 0.98 11/08/2015   ABG:  INR: Will add last result for INR, ABG once components are confirmed Will add last 4 CBG results once components are confirmed  Assessment/Plan:  1. CV - Previous a fib. He is in ST with HR in the low 100 this am and hypertensive. Will defer to primary.  Pericardial drain with 100 cc last 24 hours. Chest tube is to suction. CXR this am shows small b/l pleural effusions, no pneumothorax, increased consolidation right base, right hilar enlargement (and adenopathy). Will remove drain once output with very little output. Pericardial effusion likely related to malignancy but await final cytology results 2.  Pulmonary - Heterogeneous, largely hypoattenuating focus in the tail of the  pancreas measuring 2.5 x 2.1 cm, extensive centrally necrotic lymphadenopathy, emphysema, necrotic appearing mass in the superior segment right lower lobe.  Encourage incentive spirometer 3.  Anemia-H and H this am stable at 10.3 and 29.3 4. 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 Cyndia Bent, low dose Lovenox ok but would avoid full anticoagulation right now   Sharalyn Ink [facility] 11/13/2020,7:00 AM   Chart reviewed, patient examined, agree with above. Cytology and pathology pending. Continue pericardial tube to suction.

## 2020-11-13 NOTE — Progress Notes (Signed)
Call from radiology , states they cannot do 2 view chest xray on pt that is on airborne precautions.  Must do chest xray portable.  Order changed in system.

## 2020-11-14 ENCOUNTER — Inpatient Hospital Stay (HOSPITAL_COMMUNITY): Payer: Medicaid Other

## 2020-11-14 DIAGNOSIS — I480 Paroxysmal atrial fibrillation: Secondary | ICD-10-CM

## 2020-11-14 LAB — BASIC METABOLIC PANEL
Anion gap: 7 (ref 5–15)
BUN: 12 mg/dL (ref 6–20)
CO2: 24 mmol/L (ref 22–32)
Calcium: 8.7 mg/dL — ABNORMAL LOW (ref 8.9–10.3)
Chloride: 99 mmol/L (ref 98–111)
Creatinine, Ser: 0.72 mg/dL (ref 0.61–1.24)
GFR, Estimated: >60 mL/min (ref >60)
Glucose, Bld: 101 mg/dL — ABNORMAL HIGH (ref 70–99)
Potassium: 4 mmol/L (ref 3.5–5.1)
Sodium: 130 mmol/L — ABNORMAL LOW (ref 135–145)

## 2020-11-14 LAB — GLUCOSE, CAPILLARY
Glucose-Capillary: 108 mg/dL — ABNORMAL HIGH (ref 70–99)
Glucose-Capillary: 104 mg/dL — ABNORMAL HIGH (ref 70–99)
Glucose-Capillary: 128 mg/dL — ABNORMAL HIGH (ref 70–99)
Glucose-Capillary: 127 mg/dL — ABNORMAL HIGH (ref 70–99)

## 2020-11-14 LAB — CBC
HCT: 33.8 % — ABNORMAL LOW (ref 39.0–52.0)
Hemoglobin: 11.8 g/dL — ABNORMAL LOW (ref 13.0–17.0)
MCH: 32.6 pg (ref 26.0–34.0)
MCHC: 34.9 g/dL (ref 30.0–36.0)
MCV: 93.4 fL (ref 80.0–100.0)
Platelets: 290 10*3/uL (ref 150–400)
RBC: 3.62 MIL/uL — ABNORMAL LOW (ref 4.22–5.81)
RDW: 12.9 % (ref 11.5–15.5)
WBC: 7.9 10*3/uL (ref 4.0–10.5)
nRBC: 0 % (ref 0.0–0.2)

## 2020-11-14 MED ORDER — PANTOPRAZOLE SODIUM 40 MG PO TBEC
40.0000 mg | DELAYED_RELEASE_TABLET | Freq: Two times a day (BID) | ORAL | Status: DC
  Administered 2020-11-14 – 2020-11-19 (×10): 40 mg via ORAL
  Filled 2020-11-14 (×10): qty 1

## 2020-11-14 MED ORDER — POLYETHYLENE GLYCOL 3350 17 G PO PACK
17.0000 g | PACK | Freq: Every day | ORAL | Status: DC
  Administered 2020-11-19: 17 g via ORAL
  Filled 2020-11-14 (×5): qty 1

## 2020-11-14 NOTE — Progress Notes (Signed)
PROGRESS NOTE    Millan Legan  UYQ:[id number] DOB: October 06, 1960 DOA: 11/09/2020 PCP: Patient, No Pcp Per   Brief Narrative: 60 year old with past medical history significant for tobacco abuse, presents complaining of back pain, chest pain and cough.  Patient reports some chronic back pain but worse over the last month.  Is also complaining of chest pain.  He report productive sputum.  He continues to smoke 7 cigarettes/day.  Evaluation in the ED patient was afebrile slight tachypneic, tachycardic heart rate in the 110s blood pressure stable.  CT chest concerning for extensive necrotic adenopathy, necrotic right lower lobe mass, numerous scattered micronodules throughout bilateral lungs, pericardial effusion, bone lesions involving T9 and bilateral ribs, suspected thrombus in the left brachiocephalic vein, partially visualized pancreatic tail lesion subone other finding.  2D echo showed pericardial effusion with signs of tamponade.  Patient was taken to the OR emergently on 3/8 for pericardial window and drainage.  Patient develops recurrence of A fib RVR 3/11. Received IV metoprolol. He was started on IV amiodarone. Sinus rhythm today.   Assessment & Plan:   Principal Problem:   Pericardial effusion Active Problems:   Brachiocephalic vein thrombosis (HCC)   Multiple pulmonary nodules   Lymphadenopathy, thoracic   Right lower lobe lung mass   Thyroid nodule   Liver lesion, right lobe   Lesion of pancreas   Lesion of thoracic vertebra   Rib lesion   PAF (paroxysmal atrial fibrillation) (HCC)  1-Pericardial effusion, tamponade: -He was tachycardic, with low blood pressure.  2D echo confirmed pericardial effusion with tamponade. -CVTS was consulted, patient underwent pericardial window done 3/8, yielding bloody fluid. -Cytology Biopsy still pending. -Continue with pericardial drain, 100cc documented.  -Appreciate cardiology and cardiothoracic surgery. -report pleuritic chest pain,  continue with pain management.   2-Lung mass, necrotic lymphadenopathy, bone lesions, pancreatic tail lesion. -Patient presents with history of increased cough, pleuritic pain, back pain found to have on CT chest extensive necrotic lymphadenopathy, necrotic right lower lobe mass, numerous bilateral nodules, lucent T9 lesion with soft tissue extension, left ninth rib and right 7 rib lesions, hyper attenuating focus in the right lobe of liver, partially visualized pancreatic tail lesion. -ConcernPrimary pulmonary malignancy with widespread metastasis, pancreatic primary or less likely atypical infection. -Cytology from pericardial fluid biopsy, malignant cell presents, stain pending.  -Depending  on results may need oncology versus pulmonologist evaluation -AFB ordered unable to be send, no cough and Quantiferon gold negative.  -Appreciate Dr Lindi Adie evaluation, suspicious for lung primary.   3-Brachiocephalic thrombosis: [name] left brachiocephalic vein thrombus noted on CT -Was initially started on IV heparin.  Hold postop.  Plan to continue to hold anticoagulation postsurgery due to hemoptysis and bloody drainage from pericardial window.  -on Low dose Lovenox, prophylaxis.   4-Thyroid nodule: He will need ultrasound for follow-up.  5-Tobacco abuse: Motivated to quit, continue with nicotine patch 6-Mild hyponatremia: Monitor. Stable.  7-Hemoptysis; related to lung mass, was on anticoagulation. Holding heparin. Hb stable. No further hemoptysis.  8-A fib RVR;  -Recurrence of A fib RVR, Received IV metoprolol.  -Started  oral metoprolol. Started on Amiodarone Gtt. Transition to oral amiodarone today.     Estimated body mass index is 18.97 kg/m as calculated from the following:   Height as of this encounter: 5\' 11"  (1.803 m).   Weight as of this encounter: 61.7 kg.   DVT prophylaxis: Lovenox Code Status: Full code Family Communication: Care discussed with patient Disposition Plan:   Status is: Inpatient  Remains inpatient  appropriate because:Ongoing diagnostic testing needed not appropriate for outpatient work up   Dispo:  Patient From: Home  Planned Disposition: Home  Medically stable for discharge: No          Consultants:   Cardiothoracic surgery  Cardiology  Procedures:   Pericardial window 3/8  Antimicrobials:    Subjective: He is still having pain at site of drain. Denies worsening dyspnea. He has  Not had BM   Objective: Vitals:   11/13/20 2318 11/14/20 0345 11/14/20 0725 11/14/20 1122  BP: (!) 136/96 118/81 119/71 121/90  Pulse: 83 93 (!) 102 80  Resp: 16 19 17 16   Temp: 98.2 F (36.8 C) 98.1 F (36.7 C) 98.3 F (36.8 C) 98 F (36.7 C)  TempSrc: Oral Oral Oral Oral  SpO2: 96% 93% 92% 98%  Weight:      Height:        Intake/Output Summary (Last 24 hours) at 11/14/2020 1315 Last data filed at 11/14/2020 1124 Gross per 24 hour  Intake 139.22 ml  Output 450 ml  Net -310.78 ml   Filed Weights   11/10/20 0317  Weight: 61.7 kg    Examination:  General exam: NAD Respiratory system: BL air movement Cardiovascular system: S 1, S 2 RRR, Subxiphoid drain in place bloody fluid Gastrointestinal system: BS present, soft, nt Central nervous system: Alert Extremities: No edema  Data Reviewed: I have personally reviewed following labs and imaging studies  CBC: Recent Labs  Lab 11/09/20 2242 11/10/20 1055 11/10/20 1952 11/11/20 0416 11/12/20 0021 11/13/20 0828 11/14/20 0748  WBC 8.1 6.7  --  9.7 10.3 9.4 7.9  NEUTROABS 5.9  --   --   --   --   --   --   HGB 12.2* 11.7* 8.5* 10.7* 10.3* 12.0* 11.8*  HCT 35.3* 35.9* 25.0* 29.8* 29.3* 34.4* 33.8*  MCV 95.9 98.9  --  93.4 93.9 93.7 93.4  PLT 288 268  --  233 240 266 850   Basic Metabolic Panel: Recent Labs  Lab 11/10/20 0408 11/10/20 1952 11/11/20 0416 11/12/20 0021 11/12/20 0054 11/13/20 0828 11/14/20 0748  NA 137 136 133* 134*  --  132* 130*  K 3.5 4.0 4.1  3.5  --  3.9 4.0  CL 106  --  104 105  --  99 99  CO2 21*  --  20* 22  --  24 24  GLUCOSE 174*  --  127* 107*  --  101* 101*  BUN 13  --  9 9  --  10 12  CREATININE 0.80  --  0.68 0.81  --  0.77 0.72  CALCIUM 8.9  --  8.9 8.5*  --  8.6* 8.7*  MG  --   --   --   --  1.7 1.9  --    GFR: Estimated Creatinine Clearance: 87.8 mL/min (by C-G formula based on SCr of 0.72 mg/dL). Liver Function Tests: Recent Labs  Lab 11/09/20 2242 11/12/20 0021  AST 20 21  ALT 18 15  ALKPHOS 93 67  BILITOT 0.8 0.6  PROT 6.1* 5.0*  ALBUMIN 3.2* 2.6*   Recent Labs  Lab 11/10/20 0114  LIPASE 68*   No results for input(s): AMMONIA in the last 168 hours. Coagulation Profile: No results for input(s): INR, PROTIME in the last 168 hours. Cardiac Enzymes: No results for input(s): CKTOTAL, CKMB, CKMBINDEX, TROPONINI in the last 168 hours. BNP (last 3 results) No results for input(s): PROBNP in the last 8760 hours. HbA1C:  No results for input(s): HGBA1C in the last 72 hours. CBG: Recent Labs  Lab 11/13/20 0642 11/13/20 1101 11/13/20 2128 11/14/20 0623 11/14/20 1123  GLUCAP 106* 105* 117* 108* 104*   Lipid Profile: No results for input(s): CHOL, HDL, LDLCALC, TRIG, CHOLHDL, LDLDIRECT in the last 72 hours. Thyroid Function Tests: No results for input(s): TSH, T4TOTAL, FREET4, T3FREE, THYROIDAB in the last 72 hours. Anemia Panel: No results for input(s): VITAMINB12, FOLATE, FERRITIN, TIBC, IRON, RETICCTPCT in the last 72 hours. Sepsis Labs: No results for input(s): PROCALCITON, LATICACIDVEN in the last 168 hours.  Recent Results (from the past 240 hour(s))  Resp Panel by RT-PCR (Flu A&B, Covid) Nasopharyngeal Swab     Status: None   Collection Time: 11/10/20  1:55 AM   Specimen: Nasopharyngeal Swab; Nasopharyngeal(NP) swabs in vial transport medium  Result Value Ref Range Status   SARS Coronavirus 2 by RT PCR NEGATIVE NEGATIVE Final    Comment: (NOTE) SARS-CoV-2 target nucleic acids are NOT  DETECTED.  The SARS-CoV-2 RNA is generally detectable in upper respiratory specimens during the acute phase of infection. The lowest concentration of SARS-CoV-2 viral copies this assay can detect is 138 copies/mL. A negative result does not preclude SARS-Cov-2 infection and should not be used as the sole basis for treatment or other patient management decisions. A negative result may occur with  improper specimen collection/handling, submission of specimen other than nasopharyngeal swab, presence of viral mutation(s) within the areas targeted by this assay, and inadequate number of viral copies(<138 copies/mL). A negative result must be combined with clinical observations, patient history, and epidemiological information. The expected result is Negative.  Fact Sheet for Patients:  EntrepreneurPulse.com.au  Fact Sheet for Healthcare Providers:  IncredibleEmployment.be  This test is no t yet approved or cleared by the [place] FDA and  has been authorized for detection and/or diagnosis of SARS-CoV-2 by FDA under an Emergency Use Authorization (EUA). This EUA will remain  in effect (meaning this test can be used) for the duration of the COVID-19 declaration under Section 564(b)(1) of the Act, 21 U.S.C.section 360bbb-3(b)(1), unless the authorization is terminated  or revoked sooner.       Influenza A by PCR NEGATIVE NEGATIVE Final   Influenza B by PCR NEGATIVE NEGATIVE Final    Comment: (NOTE) The Xpert Xpress SARS-CoV-2/FLU/RSV plus assay is intended as an aid in the diagnosis of influenza from Nasopharyngeal swab specimens and should not be used as a sole basis for treatment. Nasal washings and aspirates are unacceptable for Xpert Xpress SARS-CoV-2/FLU/RSV testing.  Fact Sheet for Patients: EntrepreneurPulse.com.au  Fact Sheet for Healthcare Providers: IncredibleEmployment.be  This test is not yet  approved or cleared by the [place] FDA and has been authorized for detection and/or diagnosis of SARS-CoV-2 by FDA under an Emergency Use Authorization (EUA). This EUA will remain in effect (meaning this test can be used) for the duration of the COVID-19 declaration under Section 564(b)(1) of the Act, 21 U.S.C. section 360bbb-3(b)(1), unless the authorization is terminated or revoked.  Performed at [facility], [place] [address], [place], [place] [address]   MRSA PCR Screening     Status: None   Collection Time: 11/11/20  2:28 AM   Specimen: Nasal Mucosa; Nasopharyngeal  Result Value Ref Range Status   MRSA by PCR NEGATIVE NEGATIVE Final    Comment:        The GeneXpert MRSA Assay (FDA approved for NASAL specimens only), is one component of a comprehensive MRSA colonization  surveillance program. It is not intended to diagnose MRSA infection nor to guide or monitor treatment for MRSA infections. Performed at [facility], [place] [address] [place],  [address]          Radiology Studies: DG CHEST PORT 1 VIEW  Result Date: 11/14/2020 CLINICAL DATA:  pain, chest pain and cough.  Tobacco use. EXAM: PORTABLE CHEST 1 VIEW COMPARISON:  Chest x-ray 11/13/2020, CT chest 11/10/2020, CT chest 11/10/2020 FINDINGS: Hyperinflation with cystic changes consistent with emphysema. The heart size and mediastinal contours are unchanged. Persistent right hilar prominence. Bilateral trace to small volume pleural effusions, likely right greater than left. Right lower lobe opacity. No pneumothorax. No acute osseous abnormality. IMPRESSION: 1. Similar findings with persistent right lower lung zone opacity as well as bilateral trace to small volume pleural effusions, likely right greater than left. 2.  Emphysema (ICD10-J43.9). Electronically Signed   By: Iven Finn M.D.   On: 11/14/2020 06:59   DG CHEST PORT 1 VIEW  Result Date: 11/13/2020 CLINICAL DATA:  Pericardial effusion  EXAM: PORTABLE CHEST 1 VIEW COMPARISON:  November 12, 2020 FINDINGS: Apparent pericardial drain present, unchanged in position. Heart size within normal limits. Pulmonary vascularity within normal limits. There are pleural effusions bilaterally with consolidation in the left lower lobe. There is apparent bullous disease in the left apex region. No adenopathy evident. Postoperative change lower cervical region. IMPRESSION: Apparent pericardial drain present. Heart size within normal limits. Pleural effusions bilaterally. Left lower lobe consolidation may in part be due to atelectasis; a degree of superimposed pneumonia in the left lower lobe cannot be excluded. Bullous disease noted left apex. Electronically Signed   By: Lowella Grip III M.D.   On: 11/13/2020 08:32        Scheduled Meds: . amiodarone  200 mg Oral Q12H   Followed by  . [START ON 2020-12-09] amiodarone  200 mg Oral Daily  . Chlorhexidine Gluconate Cloth  6 each Topical Daily  . enoxaparin (LOVENOX) injection  40 mg Subcutaneous Daily  . insulin aspart  0-9 Units Subcutaneous TID WC  . ketorolac  15 mg Intravenous Q6H  . mouth rinse  15 mL Mouth Rinse BID  . metoprolol tartrate  25 mg Oral BID  . nicotine  14 mg Transdermal Daily  . pantoprazole (PROTONIX) IV  40 mg Intravenous Q12H  . senna-docusate  1 tablet Oral BID   Continuous Infusions: . sodium chloride       LOS: 4 days    Time spent: 35 minutes.     Elmarie Shiley, MD [facility]   If 7PM-7AM, please contact night-coverage [email or web address]  11/14/2020, 1:15 PM

## 2020-11-14 NOTE — Progress Notes (Addendum)
      [address]Suite [address]       ,[place] [address]             [phone]      4 Days Post-Op Procedure(s) (LRB): PERICARDIAL WINDOW (N/A) Subjective: Having some incisional pain this morning. No other complaints.   Objective: Vital signs in last 24 hours: Temp:  [97.9 F (36.6 C)-98.3 F (36.8 C)] 98.3 F (36.8 C) (03/12 0725) Pulse Rate:  [83-102] 102 (03/12 0725) Cardiac Rhythm: Normal sinus rhythm (03/12 0701) Resp:  [15-22] 17 (03/12 0725) BP: (110-136)/(71-96) 119/71 (03/12 0725) SpO2:  [92 %-96 %] 92 % (03/12 0725)  Hemodynamic parameters for last 24 hours:    Intake/Output from previous day: 03/11 0701 - 03/12 0700 In: 139.2 [I.V.:139.2] Out: 300 [Urine:200; Chest Tube:100] Intake/Output this shift: No intake/output data recorded.  General appearance: alert, cooperative and no distress Heart: regular rate and rhythm, S1, S2 normal, no murmur, click, rub or gallop Lungs: clear to auscultation bilaterally Abdomen: soft, non-tender; bowel sounds normal; no masses,  no organomegaly Extremities: left upper arm edema, questionable hematoma Wound: clean and dry  Lab Results: Recent Labs    11/12/20 0021 11/13/20 0828  WBC 10.3 9.4  HGB 10.3* 12.0*  HCT 29.3* 34.4*  PLT 240 266   BMET:  Recent Labs    11/12/20 0021 11/13/20 0828  NA 134* 132*  K 3.5 3.9  CL 105 99  CO2 22 24  GLUCOSE 107* 101*  BUN 9 10  CREATININE 0.81 0.77  CALCIUM 8.5* 8.6*    PT/INR: No results for input(s): LABPROT, INR in the last 72 hours. ABG    Component Value Date/Time   PHART 7.242 (L) 11/10/2020 1952   HCO3 22.1 11/10/2020 1952   TCO2 24 11/10/2020 1952   ACIDBASEDEF 5.0 (H) 11/10/2020 1952   O2SAT 100.0 11/10/2020 1952   CBG (last 3)  Recent Labs    11/13/20 1101 11/13/20 2128 11/14/20 0623  GLUCAP 105* 117* 108*    Assessment/Plan: S/P Procedure(s) (LRB): PERICARDIAL WINDOW (N/A)  1. CV - Previous a fib. He is in ST with HR in the low  100 this am and hypertensive. Will defer to primary.  Pericardial drain with 100 cc last 24 hours per charting. Chest tube is to suction. CXR this am shows: Similar findings with persistent right lower lung zone opacity as well as bilateral trace to small volume pleural effusions, likely right greater than left. Will remove drain once output with very little output. Pericardial effusion likely related to malignancy but await final cytology results 2.  Pulmonary -  CT scan shows: Centrally hypoattenuating lobular mass lesion seen in the posterosuperior segment right lower lobe measuring approximately 2.3 x 2.4 cm in size. Encourage incentive spirometer.  3.  Anemia-H and H this am stable at 12.0/34.4 4. 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 Cyndia Bent, low dose Lovenox ok but would avoid full anticoagulation right now  Plan: Continue pericardial drain until bone dry. Encourage incentive spirometer and ambulation as tolerated. Surgical cytology and path pending. Heat and elevation for suspected left upper arm hematoma s/p IV placement.    LOS: 4 days    Brian Leonard 11/14/2020   Chart reviewed, patient examined, agree with above. Pericardial tube is still draining bloody fluid with 100 cc recorded yesterday. I marked level today and will see how much comes out by tomorrow. Would like to have minimal output before removing.

## 2020-11-14 NOTE — Progress Notes (Signed)
Progress Note  Patient Name: Brian Leonard Date of Encounter: 11/14/2020  [facility] Cardiologist: No primary care provider on file.   Subjective   Feeling well.  He has chest pain from the drain with deep inspiration  Inpatient Medications    Scheduled Meds: . amiodarone  200 mg Oral Q12H   Followed by  . [START ON 11-26-2020] amiodarone  200 mg Oral Daily  . Chlorhexidine Gluconate Cloth  6 each Topical Daily  . enoxaparin (LOVENOX) injection  40 mg Subcutaneous Daily  . insulin aspart  0-9 Units Subcutaneous TID WC  . ketorolac  15 mg Intravenous Q6H  . mouth rinse  15 mL Mouth Rinse BID  . metoprolol tartrate  25 mg Oral BID  . nicotine  14 mg Transdermal Daily  . pantoprazole (PROTONIX) IV  40 mg Intravenous Q12H  . senna-docusate  1 tablet Oral BID   Continuous Infusions: . sodium chloride     PRN Meds: Place/Maintain arterial line **AND** sodium chloride, diphenhydrAMINE, guaiFENesin, HYDROmorphone, levalbuterol, metoprolol tartrate, ondansetron **OR** ondansetron (ZOFRAN) IV, oxyCODONE-acetaminophen, simethicone, traMADol   Vital Signs    Vitals:   11/13/20 2318 11/14/20 0345 11/14/20 0725 11/14/20 1122  BP: (!) 136/96 118/81 119/71 121/90  Pulse: 83 93 (!) 102 80  Resp: 16 19 17 16   Temp: 98.2 F (36.8 C) 98.1 F (36.7 C) 98.3 F (36.8 C) 98 F (36.7 C)  TempSrc: Oral Oral Oral Oral  SpO2: 96% 93% 92% 98%  Weight:      Height:        Intake/Output Summary (Last 24 hours) at 11/14/2020 1130 Last data filed at 11/14/2020 1124 Gross per 24 hour  Intake 139.22 ml  Output 450 ml  Net -310.78 ml   Last 3 Weights 11/10/2020 08/26/2015  Weight (lbs) 136 lb 135 lb  Weight (kg) 61.689 kg 61.236 kg      Telemetry    Sinus arrhythmia.  PVCs- Personally Reviewed  ECG    Sinus arrhythmia.  PVCs- Personally Reviewed  Physical Exam   VS:  BP 121/90 (BP Location: Right Arm)   Pulse 80   Temp 98 F (36.7 C) (Oral)   Resp 16   Ht 5\' 11"  (1.803 m)    Wt 61.7 kg   SpO2 98%   BMI 18.97 kg/m  , BMI Body mass index is 18.97 kg/m. GENERAL:  Frail.  Chronically ill-appearing HEENT: Pupils equal round and reactive, fundi not visualized, oral mucosa unremarkable NECK:  No jugular venous distention, waveform within normal limits, carotid upstroke brisk and symmetric, no bruits LUNGS:  Clear to auscultation bilaterally HEART:  Irregularly irregular.  PMI not displaced or sustained,S1 and S2 within normal limits, no S3, no S4, no clicks, no rubs, II/Vi systolic murmurs ABD:  Flat, positive bowel sounds normal in frequency in pitch, no bruits, no rebound, no guarding, no midline pulsatile mass, no hepatomegaly, no splenomegaly EXT:  2 plus pulses throughout, 2+ L UE edema, no cyanosis no clubbing SKIN:  No rashes no nodules NEURO:  Cranial nerves II through XII grossly intact, motor grossly intact throughout PSYCH:  Cognitively intact, oriented to person place and time   Labs    High Sensitivity Troponin:   Recent Labs  Lab 11/10/20 0114 11/10/20 0304  TROPONINIHS 16 16      Chemistry Recent Labs  Lab 11/09/20 2242 11/10/20 0408 11/12/20 0021 11/13/20 0828 11/14/20 0748  NA 138   < > 134* 132* 130*  K 3.6   < >  3.5 3.9 4.0  CL 107   < > 105 99 99  CO2 21*   < > 22 24 24   GLUCOSE 122*   < > 107* 101* 101*  BUN 14   < > 9 10 12   CREATININE 0.79   < > 0.81 0.77 0.72  CALCIUM 9.0   < > 8.5* 8.6* 8.7*  PROT 6.1*  --  5.0*  --   --   ALBUMIN 3.2*  --  2.6*  --   --   AST 20  --  21  --   --   ALT 18  --  15  --   --   ALKPHOS 93  --  67  --   --   BILITOT 0.8  --  0.6  --   --   GFRNONAA >60   < > >60 >60 >60  ANIONGAP 10   < > 7 9 7    < > = values in this interval not displayed.     Hematology Recent Labs  Lab 11/12/20 0021 11/13/20 0828 11/14/20 0748  WBC 10.3 9.4 7.9  RBC 3.12* 3.67* 3.62*  HGB 10.3* 12.0* 11.8*  HCT 29.3* 34.4* 33.8*  MCV 93.9 93.7 93.4  MCH 33.0 32.7 32.6  MCHC 35.2 34.9 34.9  RDW 13.2  12.9 12.9  PLT 240 266 290    BNPNo results for input(s): BNP, PROBNP in the last 168 hours.   DDimer No results for input(s): DDIMER in the last 168 hours.   Radiology    DG CHEST PORT 1 VIEW  Result Date: 11/14/2020 CLINICAL DATA:  pain, chest pain and cough.  Tobacco use. EXAM: PORTABLE CHEST 1 VIEW COMPARISON:  Chest x-ray 11/13/2020, CT chest 11/10/2020, CT chest 11/10/2020 FINDINGS: Hyperinflation with cystic changes consistent with emphysema. The heart size and mediastinal contours are unchanged. Persistent right hilar prominence. Bilateral trace to small volume pleural effusions, likely right greater than left. Right lower lobe opacity. No pneumothorax. No acute osseous abnormality. IMPRESSION: 1. Similar findings with persistent right lower lung zone opacity as well as bilateral trace to small volume pleural effusions, likely right greater than left. 2.  Emphysema (ICD10-J43.9). Electronically Signed   By: Iven Finn M.D.   On: 11/14/2020 06:59   DG CHEST PORT 1 VIEW  Result Date: 11/13/2020 CLINICAL DATA:  Pericardial effusion EXAM: PORTABLE CHEST 1 VIEW COMPARISON:  November 12, 2020 FINDINGS: Apparent pericardial drain present, unchanged in position. Heart size within normal limits. Pulmonary vascularity within normal limits. There are pleural effusions bilaterally with consolidation in the left lower lobe. There is apparent bullous disease in the left apex region. No adenopathy evident. Postoperative change lower cervical region. IMPRESSION: Apparent pericardial drain present. Heart size within normal limits. Pleural effusions bilaterally. Left lower lobe consolidation may in part be due to atelectasis; a degree of superimposed pneumonia in the left lower lobe cannot be excluded. Bullous disease noted left apex. Electronically Signed   By: Lowella Grip III M.D.   On: 11/13/2020 08:32    Cardiac Studies   Echo 11/10/20: 1. Left ventricular ejection fraction, by estimation, is  55 to 60%. The  left ventricle has normal function. The left ventricle has no regional  wall motion abnormalities. Indeterminate diastolic filling due to E-A  fusion.  2. Right ventricular systolic function is normal. The right ventricular  size is normal. The estimated right ventricular systolic pressure is 58.5  mmHg.  3. Moderate pericardial effusion. The pericardial effusion is  circumferential. Findings are consistent with cardiac tamponade.  4. The mitral valve is normal in structure. No evidence of mitral valve  regurgitation.  5. The aortic valve is tricuspid. Aortic valve regurgitation is not  visualized. No aortic stenosis is present.  6. The inferior vena cava is dilated in size with <50% respiratory  variability, suggesting right atrial pressure of 15 mmHg.   Patient Profile     60 y.o. 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  Hospitalization c/b afib with RVR.  Assessment & Plan    # Pericardial tamponade: Status post pericardial window.  Pericardial drain remains in place.  Management per CT surgery.  # Atrial fibrillation with RVR:  # Pericarditis: Currently sinus arrhythmia.  Continue amiodarone.  Not a candidate for anticoagulation at this time.  Continue metoprolol.  # Metastatic disease:  Primary source unknown.  Lesions in the lung, ribs, pelvis, pancreas, and extensive thoracic lymphadenopathy.  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      For questions or updates, please contact [place] Please consult [email or web address] for contact info under        Signed, Skeet Latch, MD  11/14/2020, 11:30 AM

## 2020-11-15 LAB — BASIC METABOLIC PANEL
Anion gap: 5 (ref 5–15)
BUN: 11 mg/dL (ref 6–20)
CO2: 28 mmol/L (ref 22–32)
Calcium: 8.4 mg/dL — ABNORMAL LOW (ref 8.9–10.3)
Chloride: 98 mmol/L (ref 98–111)
Creatinine, Ser: 0.9 mg/dL (ref 0.61–1.24)
GFR, Estimated: >60 mL/min (ref >60)
Glucose, Bld: 110 mg/dL — ABNORMAL HIGH (ref 70–99)
Potassium: 4.2 mmol/L (ref 3.5–5.1)
Sodium: 131 mmol/L — ABNORMAL LOW (ref 135–145)

## 2020-11-15 LAB — GLUCOSE, CAPILLARY
Glucose-Capillary: 112 mg/dL — ABNORMAL HIGH (ref 70–99)
Glucose-Capillary: 128 mg/dL — ABNORMAL HIGH (ref 70–99)
Glucose-Capillary: 107 mg/dL — ABNORMAL HIGH (ref 70–99)
Glucose-Capillary: 102 mg/dL — ABNORMAL HIGH (ref 70–99)

## 2020-11-15 MED ORDER — BISACODYL 5 MG PO TBEC
5.0000 mg | DELAYED_RELEASE_TABLET | Freq: Once | ORAL | Status: AC
  Administered 2020-11-15: 5 mg via ORAL
  Filled 2020-11-15: qty 1

## 2020-11-15 NOTE — Progress Notes (Signed)
PROGRESS NOTE    Brian Leonard  URK:[id number] DOB: 02-Sep-1961 DOA: 11/09/2020 PCP: Patient, No Pcp Per   Brief Narrative: 60 year old with past medical history significant for tobacco abuse, presents complaining of back pain, chest pain and cough.  Patient reports some chronic back pain but worse over the last month.  Is also complaining of chest pain.  He report productive sputum.  He continues to smoke 7 cigarettes/day.  Evaluation in the ED patient was afebrile slight tachypneic, tachycardic heart rate in the 110s blood pressure stable.  CT chest concerning for extensive necrotic adenopathy, necrotic right lower lobe mass, numerous scattered micronodules throughout bilateral lungs, pericardial effusion, bone lesions involving T9 and bilateral ribs, suspected thrombus in the left brachiocephalic vein, partially visualized pancreatic tail lesion subone other finding.  2D echo showed pericardial effusion with signs of tamponade.  Patient was taken to the OR emergently on 3/8 for pericardial window and drainage.  Patient develops recurrence of A fib RVR 3/11. Received IV metoprolol. He was started on IV amiodarone. Sinus rhythm.  Assessment & Plan:   Principal Problem:   Pericardial effusion Active Problems:   Brachiocephalic vein thrombosis (HCC)   Multiple pulmonary nodules   Lymphadenopathy, thoracic   Right lower lobe lung mass   Thyroid nodule   Liver lesion, right lobe   Lesion of pancreas   Lesion of thoracic vertebra   Rib lesion   PAF (paroxysmal atrial fibrillation) (HCC)  1-Pericardial effusion, tamponade: -He was tachycardic, with low blood pressure.  2D echo confirmed pericardial effusion with tamponade. -CVTS was consulted, patient underwent pericardial window done 3/8, yielding bloody fluid. -Cytology Biopsy still pending. -Continue with pericardial drain, 100cc documented.  -Appreciate cardiology and cardiothoracic surgery. -Still complaining of pain at site of  drain. Requiring IV pain medications.  -40 cc pericardial tube today. CVTS following.   2-Lung mass, necrotic lymphadenopathy, bone lesions, pancreatic tail lesion. -Patient presents with history of increased cough, pleuritic pain, back pain found to have on CT chest extensive necrotic lymphadenopathy, necrotic right lower lobe mass, numerous bilateral nodules, lucent T9 lesion with soft tissue extension, left ninth rib and right 7 rib lesions, hyper attenuating focus in the right lobe of liver, partially visualized pancreatic tail lesion. -ConcernPrimary pulmonary malignancy with widespread metastasis, pancreatic primary or less likely atypical infection. -Cytology from pericardial fluid biopsy, malignant cell presents, stain pending.  -AFB ordered unable to be send, no cough and Quantiferon gold negative.  -Appreciate Dr Lindi Adie evaluation, suspicious for lung primary.   3-Brachiocephalic thrombosis: [name] left brachiocephalic vein thrombus noted on CT -Was initially started on IV heparin.  Hold postop.  Plan to continue to hold anticoagulation postsurgery due to hemoptysis and bloody drainage from pericardial window.  -on Low dose Lovenox, prophylaxis.   4-Thyroid nodule: He will need ultrasound for follow-up.  5-Tobacco abuse: Motivated to quit, continue with nicotine patch 6-Mild hyponatremia: Monitor. Stable.  7-Hemoptysis; related to lung mass, was on anticoagulation. Holding heparin. Hb stable. - No further hemoptysis.  8-A fib RVR;  -Recurrence of A fib RVR, Received IV metoprolol.  -Started  oral metoprolol. Started on Amiodarone Gtt. Transition to oral amiodarone.    Estimated body mass index is 18.97 kg/m as calculated from the following:   Height as of this encounter: 5\' 11"  (1.803 m).   Weight as of this encounter: 61.7 kg.   DVT prophylaxis: Lovenox Code Status: Full code Family Communication: Care discussed with patient Disposition Plan:  Status is:  Inpatient  Remains inpatient appropriate  because:Ongoing diagnostic testing needed not appropriate for outpatient work up   Dispo:  Patient From: Home  Planned Disposition: Home  Medically stable for discharge: No          Consultants:   Cardiothoracic surgery  Cardiology  Procedures:   Pericardial window 3/8  Antimicrobials:    Subjective: Pain at site of drain.  Left arm with edema No BM yet,   Objective: Vitals:   11/14/20 1532 11/14/20 1954 11/14/20 2344 11/15/20 0356  BP: 128/90 119/88 (!) 141/97 (!) 125/96  Pulse: 92 100 96 98  Resp: 20 15 17 12   Temp: 98 F (36.7 C) 98 F (36.7 C) 99.2 F (37.3 C) 98.9 F (37.2 C)  TempSrc: Oral Oral Oral Oral  SpO2: 91% 99% 94% 99%  Weight:      Height:        Intake/Output Summary (Last 24 hours) at 11/15/2020 5465 Last data filed at 11/15/2020 0400 Gross per 24 hour  Intake -  Output 560 ml  Net -560 ml   Filed Weights   11/10/20 0317  Weight: 61.7 kg    Examination:  General exam: NAD Respiratory system: CTA Cardiovascular system: S 1, S 2 RRR Subxiphoid drain in place bloody fluid Gastrointestinal system: BS present, soft, nt Central nervous system: Alert Extremities: No edema  Data Reviewed: I have personally reviewed following labs and imaging studies  CBC: Recent Labs  Lab 11/09/20 2242 11/10/20 1055 11/10/20 1952 11/11/20 0416 11/12/20 0021 11/13/20 0828 11/14/20 0748  WBC 8.1 6.7  --  9.7 10.3 9.4 7.9  NEUTROABS 5.9  --   --   --   --   --   --   HGB 12.2* 11.7* 8.5* 10.7* 10.3* 12.0* 11.8*  HCT 35.3* 35.9* 25.0* 29.8* 29.3* 34.4* 33.8*  MCV 95.9 98.9  --  93.4 93.9 93.7 93.4  PLT 288 268  --  233 240 266 035   Basic Metabolic Panel: Recent Labs  Lab 11/11/20 0416 11/12/20 0021 11/12/20 0054 11/13/20 0828 11/14/20 0748 11/15/20 0125  NA 133* 134*  --  132* 130* 131*  K 4.1 3.5  --  3.9 4.0 4.2  CL 104 105  --  99 99 98  CO2 20* 22  --  24 24 28   GLUCOSE 127* 107*   --  101* 101* 110*  BUN 9 9  --  10 12 11   CREATININE 0.68 0.81  --  0.77 0.72 0.90  CALCIUM 8.9 8.5*  --  8.6* 8.7* 8.4*  MG  --   --  1.7 1.9  --   --    GFR: Estimated Creatinine Clearance: 78.1 mL/min (by C-G formula based on SCr of 0.9 mg/dL). Liver Function Tests: Recent Labs  Lab 11/09/20 2242 11/12/20 0021  AST 20 21  ALT 18 15  ALKPHOS 93 67  BILITOT 0.8 0.6  PROT 6.1* 5.0*  ALBUMIN 3.2* 2.6*   Recent Labs  Lab 11/10/20 0114  LIPASE 68*   No results for input(s): AMMONIA in the last 168 hours. Coagulation Profile: No results for input(s): INR, PROTIME in the last 168 hours. Cardiac Enzymes: No results for input(s): CKTOTAL, CKMB, CKMBINDEX, TROPONINI in the last 168 hours. BNP (last 3 results) No results for input(s): PROBNP in the last 8760 hours. HbA1C: No results for input(s): HGBA1C in the last 72 hours. CBG: Recent Labs  Lab 11/14/20 0623 11/14/20 1123 11/14/20 1557 11/14/20 2131 11/15/20 0639  GLUCAP 108* 104* 128* 127* 112*  Lipid Profile: No results for input(s): CHOL, HDL, LDLCALC, TRIG, CHOLHDL, LDLDIRECT in the last 72 hours. Thyroid Function Tests: No results for input(s): TSH, T4TOTAL, FREET4, T3FREE, THYROIDAB in the last 72 hours. Anemia Panel: No results for input(s): VITAMINB12, FOLATE, FERRITIN, TIBC, IRON, RETICCTPCT in the last 72 hours. Sepsis Labs: No results for input(s): PROCALCITON, LATICACIDVEN in the last 168 hours.  Recent Results (from the past 240 hour(s))  Resp Panel by RT-PCR (Flu A&B, Covid) Nasopharyngeal Swab     Status: None   Collection Time: 11/10/20  1:55 AM   Specimen: Nasopharyngeal Swab; Nasopharyngeal(NP) swabs in vial transport medium  Result Value Ref Range Status   SARS Coronavirus 2 by RT PCR NEGATIVE NEGATIVE Final    Comment: (NOTE) SARS-CoV-2 target nucleic acids are NOT DETECTED.  The SARS-CoV-2 RNA is generally detectable in upper respiratory specimens during the acute phase of infection. The  lowest concentration of SARS-CoV-2 viral copies this assay can detect is 138 copies/mL. A negative result does not preclude SARS-Cov-2 infection and should not be used as the sole basis for treatment or other patient management decisions. A negative result may occur with  improper specimen collection/handling, submission of specimen other than nasopharyngeal swab, presence of viral mutation(s) within the areas targeted by this assay, and inadequate number of viral copies(<138 copies/mL). A negative result must be combined with clinical observations, patient history, and epidemiological information. The expected result is Negative.  Fact Sheet for Patients:  EntrepreneurPulse.com.au  Fact Sheet for Healthcare Providers:  IncredibleEmployment.be  This test is no t yet approved or cleared by the [place] FDA and  has been authorized for detection and/or diagnosis of SARS-CoV-2 by FDA under an Emergency Use Authorization (EUA). This EUA will remain  in effect (meaning this test can be used) for the duration of the COVID-19 declaration under Section 564(b)(1) of the Act, 21 U.S.C.section 360bbb-3(b)(1), unless the authorization is terminated  or revoked sooner.       Influenza A by PCR NEGATIVE NEGATIVE Final   Influenza B by PCR NEGATIVE NEGATIVE Final    Comment: (NOTE) The Xpert Xpress SARS-CoV-2/FLU/RSV plus assay is intended as an aid in the diagnosis of influenza from Nasopharyngeal swab specimens and should not be used as a sole basis for treatment. Nasal washings and aspirates are unacceptable for Xpert Xpress SARS-CoV-2/FLU/RSV testing.  Fact Sheet for Patients: EntrepreneurPulse.com.au  Fact Sheet for Healthcare Providers: IncredibleEmployment.be  This test is not yet approved or cleared by the [place] FDA and has been authorized for detection and/or diagnosis of SARS-CoV-2 by FDA under  an Emergency Use Authorization (EUA). This EUA will remain in effect (meaning this test can be used) for the duration of the COVID-19 declaration under Section 564(b)(1) of the Act, 21 U.S.C. section 360bbb-3(b)(1), unless the authorization is terminated or revoked.  Performed at [facility], [place] [address], [place], [place] [address]   MRSA PCR Screening     Status: None   Collection Time: 11/11/20  2:28 AM   Specimen: Nasal Mucosa; Nasopharyngeal  Result Value Ref Range Status   MRSA by PCR NEGATIVE NEGATIVE Final    Comment:        The GeneXpert MRSA Assay (FDA approved for NASAL specimens only), is one component of a comprehensive MRSA colonization surveillance program. It is not intended to diagnose MRSA infection nor to guide or monitor treatment for MRSA infections. Performed at [facility], [place] [address] [place], [place] [address]  Radiology Studies: DG CHEST PORT 1 VIEW  Result Date: 11/14/2020 CLINICAL DATA:  pain, chest pain and cough.  Tobacco use. EXAM: PORTABLE CHEST 1 VIEW COMPARISON:  Chest x-ray 11/13/2020, CT chest 11/10/2020, CT chest 11/10/2020 FINDINGS: Hyperinflation with cystic changes consistent with emphysema. The heart size and mediastinal contours are unchanged. Persistent right hilar prominence. Bilateral trace to small volume pleural effusions, likely right greater than left. Right lower lobe opacity. No pneumothorax. No acute osseous abnormality. IMPRESSION: 1. Similar findings with persistent right lower lung zone opacity as well as bilateral trace to small volume pleural effusions, likely right greater than left. 2.  Emphysema (ICD10-J43.9). Electronically Signed   By: Iven Finn M.D.   On: 11/14/2020 06:59        Scheduled Meds: . amiodarone  200 mg Oral Q12H   Followed by  . [START ON December 07, 2020] amiodarone  200 mg Oral Daily  . Chlorhexidine Gluconate Cloth  6 each Topical Daily  . enoxaparin (LOVENOX)  injection  40 mg Subcutaneous Daily  . insulin aspart  0-9 Units Subcutaneous TID WC  . mouth rinse  15 mL Mouth Rinse BID  . metoprolol tartrate  25 mg Oral BID  . nicotine  14 mg Transdermal Daily  . pantoprazole  40 mg Oral BID  . polyethylene glycol  17 g Oral Daily  . senna-docusate  1 tablet Oral BID   Continuous Infusions: . sodium chloride       LOS: 5 days    Time spent: 35 minutes.     Elmarie Shiley, MD [facility]   If 7PM-7AM, please contact night-coverage [email or web address]  11/15/2020, 7:23 AM

## 2020-11-15 NOTE — Progress Notes (Addendum)
      [address]       [place] [address]             [phone]      5 Days Post-Op Procedure(s) (LRB): PERICARDIAL WINDOW (N/A) Subjective: Feels okay, has not walked yet due to being on suction.  Objective: Vital signs in last 24 hours: Temp:  [97.8 F (36.6 C)-99.2 F (37.3 C)] 97.8 F (36.6 C) (03/13 0820) Pulse Rate:  [80-100] 98 (03/13 0820) Cardiac Rhythm: Sinus tachycardia (03/13 0701) Resp:  [12-20] 14 (03/13 0820) BP: (119-141)/(82-97) 123/82 (03/13 0820) SpO2:  [91 %-99 %] 96 % (03/13 0820)     Intake/Output from previous day: 03/12 0701 - 03/13 0700 In: -  Out: 560 [Urine:450; Chest Tube:110] Intake/Output this shift: No intake/output data recorded.  General appearance: alert, cooperative and no distress Heart: regular rate and rhythm, S1, S2 normal, no murmur, click, rub or gallop Lungs: clear to auscultation bilaterally Abdomen: soft, non-tender; bowel sounds normal; no masses,  no organomegaly Extremities: left upper arm with edema Wound: clean and dry  Lab Results: Recent Labs    11/13/20 0828 11/14/20 0748  WBC 9.4 7.9  HGB 12.0* 11.8*  HCT 34.4* 33.8*  PLT 266 290   BMET:  Recent Labs    11/14/20 0748 11/15/20 0125  NA 130* 131*  K 4.0 4.2  CL 99 98  CO2 24 28  GLUCOSE 101* 110*  BUN 12 11  CREATININE 0.72 0.90  CALCIUM 8.7* 8.4*    PT/INR: No results for input(s): LABPROT, INR in the last 72 hours. ABG    Component Value Date/Time   PHART 7.242 (L) 11/10/2020 1952   HCO3 22.1 11/10/2020 1952   TCO2 24 11/10/2020 1952   ACIDBASEDEF 5.0 (H) 11/10/2020 1952   O2SAT 100.0 11/10/2020 1952   CBG (last 3)  Recent Labs    11/14/20 1557 11/14/20 2131 11/15/20 0639  GLUCAP 128* 127* 112*    Assessment/Plan: S/P Procedure(s) (LRB): PERICARDIAL WINDOW (N/A)  1. CV -Previous a fib.HR 90s this am, BP well controlled. Pericardial drain with 65 cc last 24 hours per my mark on the chest tube. Chest tube  is to suction. CXR 3/12 shows: Similar findings with persistent right lower lung zone opacity as well as bilateral trace to small volume pleural effusions, likely right greater than left. Will remove drain once output with very little output. Pericardial effusion likely related to malignancy but await final cytology results 2. Pulmonary - CT scan shows: Centrally hypoattenuating lobular mass lesion seen in the posterosuperior segment right lower lobe measuring approximately 2.3 x 2.4 cm in size. Encourage incentive spirometer.  3. Anemia-H and H 11.8/33.8, stable 4. 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 Cyndia Bent, low dose Lovenox ok but would avoid full anticoagulation right now. No signs of infection.  Plan: Labs okay today, continue chest tube at this time until drainage is scant. Patient stable, resting comfortably.    LOS: 5 days    Elgie Collard 11/15/2020   Chart reviewed, patient examined, agree with above. Pericardial tube has 40 cc out in 24 hrs by my mark. Will keep in today and reevaluate in am. If no change, will remove in am.

## 2020-11-15 NOTE — Plan of Care (Signed)

## 2020-11-15 NOTE — Progress Notes (Signed)
Progress Note  Patient Name: Brian Leonard Date of Encounter: 11/15/2020  [facility] Cardiologist: No primary care provider on file.   Subjective   Doing well this morning. Minor chest pain at drain site.  Drain with 110 cc output  Inpatient Medications    Scheduled Meds: . amiodarone  200 mg Oral Q12H   Followed by  . [START ON 2020/11/22] amiodarone  200 mg Oral Daily  . Chlorhexidine Gluconate Cloth  6 each Topical Daily  . enoxaparin (LOVENOX) injection  40 mg Subcutaneous Daily  . insulin aspart  0-9 Units Subcutaneous TID WC  . mouth rinse  15 mL Mouth Rinse BID  . metoprolol tartrate  25 mg Oral BID  . nicotine  14 mg Transdermal Daily  . pantoprazole  40 mg Oral BID  . polyethylene glycol  17 g Oral Daily  . senna-docusate  1 tablet Oral BID   Continuous Infusions: . sodium chloride     PRN Meds: Place/Maintain arterial line **AND** sodium chloride, diphenhydrAMINE, guaiFENesin, HYDROmorphone, levalbuterol, metoprolol tartrate, ondansetron **OR** ondansetron (ZOFRAN) IV, oxyCODONE-acetaminophen, simethicone, traMADol   Vital Signs    Vitals:   11/14/20 1954 11/14/20 2344 11/15/20 0356 11/15/20 0820  BP: 119/88 (!) 141/97 (!) 125/96 123/82  Pulse: 100 96 98 98  Resp: 15 17 12 14   Temp: 98 F (36.7 C) 99.2 F (37.3 C) 98.9 F (37.2 C) 97.8 F (36.6 C)  TempSrc: Oral Oral Oral Oral  SpO2: 99% 94% 99% 96%  Weight:      Height:        Intake/Output Summary (Last 24 hours) at 11/15/2020 0908 Last data filed at 11/15/2020 0400 Gross per 24 hour  Intake --  Output 560 ml  Net -560 ml   Last 3 Weights 11/10/2020 08/26/2015  Weight (lbs) 136 lb 135 lb  Weight (kg) 61.689 kg 61.236 kg      Telemetry    Sinus tachycardia/sinus arrhythmia with PVC - Personally Reviewed  ECG    No new tracing- Personally Reviewed  Physical Exam   GEN: Frail, chronically ill appearing   Neck: No JVD Cardiac: RRR, 2/6 systolic murmur. No rubs, or gallops. Drain  in place Respiratory: Clear to auscultation bilaterally. GI: Soft, nontender, non-distended  MS: 2+ LUE edema. No LE edema. Neuro:  Nonfocal  Psych: Normal affect   Labs    High Sensitivity Troponin:   Recent Labs  Lab 11/10/20 0114 11/10/20 0304  TROPONINIHS 16 16      Chemistry Recent Labs  Lab 11/09/20 2242 11/10/20 0408 11/12/20 0021 11/13/20 0828 11/14/20 0748 11/15/20 0125  NA 138   < > 134* 132* 130* 131*  K 3.6   < > 3.5 3.9 4.0 4.2  CL 107   < > 105 99 99 98  CO2 21*   < > 22 24 24 28   GLUCOSE 122*   < > 107* 101* 101* 110*  BUN 14   < > 9 10 12 11   CREATININE 0.79   < > 0.81 0.77 0.72 0.90  CALCIUM 9.0   < > 8.5* 8.6* 8.7* 8.4*  PROT 6.1*  --  5.0*  --   --   --   ALBUMIN 3.2*  --  2.6*  --   --   --   AST 20  --  21  --   --   --   ALT 18  --  15  --   --   --   ALKPHOS 93  --  67  --   --   --   BILITOT 0.8  --  0.6  --   --   --   GFRNONAA >60   < > >60 >60 >60 >60  ANIONGAP 10   < > 7 9 7 5    < > = values in this interval not displayed.     Hematology Recent Labs  Lab 11/12/20 0021 11/13/20 0828 11/14/20 0748  WBC 10.3 9.4 7.9  RBC 3.12* 3.67* 3.62*  HGB 10.3* 12.0* 11.8*  HCT 29.3* 34.4* 33.8*  MCV 93.9 93.7 93.4  MCH 33.0 32.7 32.6  MCHC 35.2 34.9 34.9  RDW 13.2 12.9 12.9  PLT 240 266 290    BNPNo results for input(s): BNP, PROBNP in the last 168 hours.   DDimer No results for input(s): DDIMER in the last 168 hours.   Radiology    DG CHEST PORT 1 VIEW  Result Date: 11/14/2020 CLINICAL DATA:  pain, chest pain and cough.  Tobacco use. EXAM: PORTABLE CHEST 1 VIEW COMPARISON:  Chest x-ray 11/13/2020, CT chest 11/10/2020, CT chest 11/10/2020 FINDINGS: Hyperinflation with cystic changes consistent with emphysema. The heart size and mediastinal contours are unchanged. Persistent right hilar prominence. Bilateral trace to small volume pleural effusions, likely right greater than left. Right lower lobe opacity. No pneumothorax. No acute  osseous abnormality. IMPRESSION: 1. Similar findings with persistent right lower lung zone opacity as well as bilateral trace to small volume pleural effusions, likely right greater than left. 2.  Emphysema (ICD10-J43.9). Electronically Signed   By: Iven Finn M.D.   On: 11/14/2020 06:59    Cardiac Studies   Echo 11/10/20: 1. Left ventricular ejection fraction, by estimation, is 55 to 60%. The  left ventricle has normal function. The left ventricle has no regional  wall motion abnormalities. Indeterminate diastolic filling due to E-A  fusion.  2. Right ventricular systolic function is normal. The right ventricular  size is normal. The estimated right ventricular systolic pressure is 20.2  mmHg.  3. Moderate pericardial effusion. The pericardial effusion is  circumferential. Findings are consistent with cardiac tamponade.  4. The mitral valve is normal in structure. No evidence of mitral valve  regurgitation.  5. The aortic valve is tricuspid. Aortic valve regurgitation is not  visualized. No aortic stenosis is present.  6. The inferior vena cava is dilated in size with <50% respiratory  variability, suggesting right atrial pressure of 15 mmHg.   Patient Profile     60 y.o. 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  Hospitalization c/b afib with RVR.  Assessment & Plan    # Pericardial tamponade: Status post pericardial window.  Pericardial drain remains in place.  Management per CT surgery. -Drain management per CT surgery  # Atrial fibrillation with RVR:  # Pericarditis: Currently sinus arrhythmia.  Continue amiodarone and metoprolol.  Not a candidate for anticoagulation at this time.  -Continue metop and amio -No AC at this time  # Metastatic disease:  Primary source unknown.  Lesions in the lung, ribs, pelvis, pancreas, and extensive thoracic lymphadenopathy. Management per Oncology. -Oncology  following, appreciate recommendations  #Left brachiocephalic vein thrombosis: Not a candidate for anticoagulation.    For questions or updates, please contact [place] Please consult [email or web address] for contact info under        Signed, Freada Bergeron, MD  11/15/2020, 9:08 AM

## 2020-11-16 ENCOUNTER — Encounter: Payer: Self-pay | Admitting: *Deleted

## 2020-11-16 ENCOUNTER — Other Ambulatory Visit: Payer: Self-pay | Admitting: Student

## 2020-11-16 ENCOUNTER — Inpatient Hospital Stay (HOSPITAL_COMMUNITY): Payer: Medicaid Other

## 2020-11-16 DIAGNOSIS — I3139 Other pericardial effusion (noninflammatory): Secondary | ICD-10-CM

## 2020-11-16 DIAGNOSIS — I313 Pericardial effusion (noninflammatory): Secondary | ICD-10-CM

## 2020-11-16 LAB — CBC
HCT: 31.9 % — ABNORMAL LOW (ref 39.0–52.0)
Hemoglobin: 11 g/dL — ABNORMAL LOW (ref 13.0–17.0)
MCH: 32.2 pg (ref 26.0–34.0)
MCHC: 34.5 g/dL (ref 30.0–36.0)
MCV: 93.3 fL (ref 80.0–100.0)
Platelets: 344 10*3/uL (ref 150–400)
RBC: 3.42 MIL/uL — ABNORMAL LOW (ref 4.22–5.81)
RDW: 13.1 % (ref 11.5–15.5)
WBC: 8.7 10*3/uL (ref 4.0–10.5)
nRBC: 0 % (ref 0.0–0.2)

## 2020-11-16 LAB — GLUCOSE, CAPILLARY
Glucose-Capillary: 106 mg/dL — ABNORMAL HIGH (ref 70–99)
Glucose-Capillary: 109 mg/dL — ABNORMAL HIGH (ref 70–99)
Glucose-Capillary: 99 mg/dL (ref 70–99)
Glucose-Capillary: 103 mg/dL — ABNORMAL HIGH (ref 70–99)

## 2020-11-16 LAB — BASIC METABOLIC PANEL
Anion gap: 6 (ref 5–15)
BUN: 9 mg/dL (ref 6–20)
CO2: 28 mmol/L (ref 22–32)
Calcium: 8.7 mg/dL — ABNORMAL LOW (ref 8.9–10.3)
Chloride: 98 mmol/L (ref 98–111)
Creatinine, Ser: 0.83 mg/dL (ref 0.61–1.24)
GFR, Estimated: >60 mL/min (ref >60)
Glucose, Bld: 106 mg/dL — ABNORMAL HIGH (ref 70–99)
Potassium: 4.3 mmol/L (ref 3.5–5.1)
Sodium: 132 mmol/L — ABNORMAL LOW (ref 135–145)

## 2020-11-16 MED ORDER — ALBUMIN HUMAN 25 % IV SOLN
25.0000 g | Freq: Once | INTRAVENOUS | Status: AC
  Administered 2020-11-16: 25 g via INTRAVENOUS
  Filled 2020-11-16: qty 100

## 2020-11-16 MED ORDER — FUROSEMIDE 10 MG/ML IJ SOLN
20.0000 mg | Freq: Once | INTRAMUSCULAR | Status: AC
  Administered 2020-11-16: 20 mg via INTRAVENOUS
  Filled 2020-11-16: qty 2

## 2020-11-16 NOTE — Progress Notes (Signed)
I received an update on Brian Leonard case by Dr. Lindi Adie.  Patient had bx but dx is pending. I reached out to Dr. Cyndia Bent and updated him that we are following up on his case and will get him into see Dr. Julien Nordmann for an outpatient visit.

## 2020-11-16 NOTE — Progress Notes (Signed)
Progress Note  Patient Name: Brian Leonard Date of Encounter: 11/16/2020  [facility] Cardiologist: No primary care provider on file.   Subjective   He continues to have chest pain with inspiration, no dyspnea.  Inpatient Medications    Scheduled Meds: . amiodarone  200 mg Oral Q12H   Followed by  . [START ON 12-13-2020] amiodarone  200 mg Oral Daily  . Chlorhexidine Gluconate Cloth  6 each Topical Daily  . enoxaparin (LOVENOX) injection  40 mg Subcutaneous Daily  . furosemide  20 mg Intravenous Once  . insulin aspart  0-9 Units Subcutaneous TID WC  . mouth rinse  15 mL Mouth Rinse BID  . metoprolol tartrate  25 mg Oral BID  . nicotine  14 mg Transdermal Daily  . pantoprazole  40 mg Oral BID  . polyethylene glycol  17 g Oral Daily  . senna-docusate  1 tablet Oral BID   Continuous Infusions: . sodium chloride    . albumin human     PRN Meds: Place/Maintain arterial line **AND** sodium chloride, diphenhydrAMINE, guaiFENesin, HYDROmorphone, levalbuterol, metoprolol tartrate, ondansetron **OR** ondansetron (ZOFRAN) IV, oxyCODONE-acetaminophen, simethicone, traMADol   Vital Signs    Vitals:   11/15/20 1928 11/15/20 2358 11/16/20 0339 11/16/20 0812  BP: (!) 139/99 113/81 120/83 106/75  Pulse: (!) 106 95 98 (!) 109  Resp: 14 19 14 18   Temp: 98.7 F (37.1 C) 98.8 F (37.1 C) 98.4 F (36.9 C)   TempSrc: Oral Oral Oral   SpO2: 95% 93% 93% 91%  Weight:      Height:        Intake/Output Summary (Last 24 hours) at 11/16/2020 0921 Last data filed at 11/16/2020 0715 Gross per 24 hour  Intake 240 ml  Output 420 ml  Net -180 ml   Last 3 Weights 11/10/2020 08/26/2015  Weight (lbs) 136 lb 135 lb  Weight (kg) 61.689 kg 61.236 kg      Telemetry    Sinus arrhythmia.  PVCs- Personally Reviewed  ECG    Sinus arrhythmia.  PVCs- Personally Reviewed  Physical Exam   VS:  BP 106/75 (BP Location: Right Arm)   Pulse (!) 109   Temp 98.4 F (36.9 C) (Oral)   Resp 18    Ht 5\' 11"  (1.803 m)   Wt 61.7 kg   SpO2 91%   BMI 18.97 kg/m  , BMI Body mass index is 18.97 kg/m. GENERAL:  Frail.  Chronically ill-appearing HEENT: Pupils equal round and reactive, fundi not visualized, oral mucosa unremarkable NECK:  No jugular venous distention, waveform within normal limits, carotid upstroke brisk and symmetric, no bruits LUNGS:  Clear to auscultation bilaterally HEART:  Irregularly irregular.  PMI not displaced or sustained,S1 and S2 within normal limits, no S3, no S4, no clicks, no rubs, II/Vi systolic murmurs ABD:  Flat, positive bowel sounds normal in frequency in pitch, no bruits, no rebound, no guarding, no midline pulsatile mass, no hepatomegaly, no splenomegaly EXT:  2 plus pulses throughout, 2+ L UE edema, no cyanosis no clubbing SKIN:  No rashes no nodules NEURO:  Cranial nerves II through XII grossly intact, motor grossly intact throughout PSYCH:  Cognitively intact, oriented to person place and time   Labs    High Sensitivity Troponin:   Recent Labs  Lab 11/10/20 0114 11/10/20 0304  TROPONINIHS 16 16      Chemistry Recent Labs  Lab 11/09/20 2242 11/10/20 0408 11/12/20 0021 11/13/20 0828 11/14/20 0748 11/15/20 0125 11/16/20 0106  NA 138   < >  134*   < > 130* 131* 132*  K 3.6   < > 3.5   < > 4.0 4.2 4.3  CL 107   < > 105   < > 99 98 98  CO2 21*   < > 22   < > 24 28 28   GLUCOSE 122*   < > 107*   < > 101* 110* 106*  BUN 14   < > 9   < > 12 11 9   CREATININE 0.79   < > 0.81   < > 0.72 0.90 0.83  CALCIUM 9.0   < > 8.5*   < > 8.7* 8.4* 8.7*  PROT 6.1*  --  5.0*  --   --   --   --   ALBUMIN 3.2*  --  2.6*  --   --   --   --   AST 20  --  21  --   --   --   --   ALT 18  --  15  --   --   --   --   ALKPHOS 93  --  67  --   --   --   --   BILITOT 0.8  --  0.6  --   --   --   --   GFRNONAA >60   < > >60   < > >60 >60 >60  ANIONGAP 10   < > 7   < > 7 5 6    < > = values in this interval not displayed.    Hematology Recent Labs  Lab  11/13/20 0828 11/14/20 0748 11/16/20 0106  WBC 9.4 7.9 8.7  RBC 3.67* 3.62* 3.42*  HGB 12.0* 11.8* 11.0*  HCT 34.4* 33.8* 31.9*  MCV 93.7 93.4 93.3  MCH 32.7 32.6 32.2  MCHC 34.9 34.9 34.5  RDW 12.9 12.9 13.1  PLT 266 290 344   BNPNo results for input(s): BNP, PROBNP in the last 168 hours.   DDimer No results for input(s): DDIMER in the last 168 hours.   Radiology    DG Chest Port 1 View  Result Date: 11/16/2020 CLINICAL DATA:  Pericardial effusion.  Chest pain EXAM: PORTABLE CHEST 1 VIEW COMPARISON:  November 14, 2020 chest radiograph and chest CT FINDINGS: Pericardial drain in place. The heart size and pulmonary vascularity appear within normal limits. There are pleural effusions bilaterally with bibasilar atelectasis. Fullness in the right paratracheal region is stable, consistent with adenopathy noted on recent CT. There is a degree of right hilar and azygos region adenopathy as well. No bone lesions. Postoperative change lower cervical region. IMPRESSION: Pericardial drain in place.  Heart size normal. Pleural effusions bilaterally with bibasilar atelectasis. Areas of adenopathy in the right paratracheal, azygos, and right hilar regions seen by radiography. Adenopathy in other areas better seen on CT. Electronically Signed   By: Lowella Grip III M.D.   On: 11/16/2020 07:59   Cardiac Studies   Echo 11/10/20: 1. Left ventricular ejection fraction, by estimation, is 55 to 60%. The  left ventricle has normal function. The left ventricle has no regional  wall motion abnormalities. Indeterminate diastolic filling due to E-A  fusion.  2. Right ventricular systolic function is normal. The right ventricular  size is normal. The estimated right ventricular systolic pressure is 81.4  mmHg.  3. Moderate pericardial effusion. The pericardial effusion is  circumferential. Findings are consistent with cardiac tamponade.  4. The mitral valve is normal in structure. No  evidence of mitral  valve  regurgitation.  5. The aortic valve is tricuspid. Aortic valve regurgitation is not  visualized. No aortic stenosis is present.  6. The inferior vena cava is dilated in size with <50% respiratory  variability, suggesting right atrial pressure of 15 mmHg.    Patient Profile     60 y.o. 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  Hospitalization c/b afib with RVR.  Assessment & Plan    # Pericardial tamponade: Status post pericardial window.  Pericardial drain remains in place, minimal discharge in the last 48 hours, the drain is planned to be removed today. Management per CT surgery. Repeat echocardiogram 2 weeks post drain removal.  # Atrial fibrillation with RVR:  # Pericarditis: Currently sinus rhythm to sinus tachycardia, ventricular rates around 100 BPM.  Continue amiodarone.  Not a candidate for anticoagulation at this time.  Continue metoprolol.  # Metastatic disease:  Primary source unknown.  Lesions in the lung, ribs, pelvis, pancreas, and extensive thoracic lymphadenopathy.  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  [facility] will sign off.   Medication Recommendations:  As above Other recommendations (labs, testing, etc):  Echocardiogram in 2 weeks.  Follow up as an outpatient: we will arrange in 2-3 weeks with an echocardiogram.  For questions or updates, please contact [place] Please consult [email or web address] for contact info under    Signed, Ena Dawley, MD  11/16/2020, 9:21 AM

## 2020-11-16 NOTE — Plan of Care (Signed)

## 2020-11-16 NOTE — Progress Notes (Signed)
PROGRESS NOTE    Brian Leonard  AYT:[id number] DOB: [birth year] DOA: 11/09/2020 PCP: Patient, No Pcp Per   Brief Narrative: 60 year old with past medical history significant for tobacco abuse, presents complaining of back pain, chest pain and cough.  Patient reports some chronic back pain but worse over the last month.  Is also complaining of chest pain.  He report productive sputum.  He continues to smoke 7 cigarettes/day.  Evaluation in the ED patient was afebrile slight tachypneic, tachycardic heart rate in the 110s blood pressure stable.  CT chest concerning for extensive necrotic adenopathy, necrotic right lower lobe mass, numerous scattered micronodules throughout bilateral lungs, pericardial effusion, bone lesions involving T9 and bilateral ribs, suspected thrombus in the left brachiocephalic vein, partially visualized pancreatic tail lesion subone other finding.  2D echo showed pericardial effusion with signs of tamponade.  Patient was taken to the OR emergently on 3/8 for pericardial window and drainage.  Patient develops recurrence of A fib RVR 3/11. Received IV metoprolol. He was started on IV amiodarone. Sinus rhythm.  Assessment & Plan:   Principal Problem:   Pericardial effusion Active Problems:   Brachiocephalic vein thrombosis (HCC)   Multiple pulmonary nodules   Lymphadenopathy, thoracic   Right lower lobe lung mass   Thyroid nodule   Liver lesion, right lobe   Lesion of pancreas   Lesion of thoracic vertebra   Rib lesion   PAF (paroxysmal atrial fibrillation) (HCC)  1-Pericardial effusion, tamponade: -He was tachycardic, with low blood pressure.  2D echo confirmed pericardial effusion with tamponade. -CVTS was consulted, patient underwent pericardial window done 3/8, yielding bloody fluid. -Cytology Biopsy still pending. -Continue with pericardial drain, 100cc documented.  -Appreciate cardiology and cardiothoracic surgery. -Still requiring IV pain medications.   -Plan to remove pericardial drain today.   2-Lung mass, necrotic lymphadenopathy, bone lesions, pancreatic tail lesion. -Patient presents with history of increased cough, pleuritic pain, back pain found to have on CT chest extensive necrotic lymphadenopathy, necrotic right lower lobe mass, numerous bilateral nodules, lucent T9 lesion with soft tissue extension, left ninth rib and right 7 rib lesions, hyper attenuating focus in the right lobe of liver, partially visualized pancreatic tail lesion. -ConcernPrimary pulmonary malignancy with widespread metastasis, pancreatic primary or less likely atypical infection. -Cytology from pericardial fluid biopsy, malignant cell presents, stain pending.  -AFB ordered unable to be send, no cough and Quantiferon gold negative.  -Appreciate Dr Lindi Adie evaluation, suspicious for lung primary.   3-Brachiocephalic thrombosis: [name] left brachiocephalic vein thrombus noted on CT -Was initially started on IV heparin.  Hold postop.  Plan to continue to hold anticoagulation postsurgery due to hemoptysis and bloody drainage from pericardial window.  -on Low dose Lovenox, prophylaxis.   4-Thyroid nodule: He will need ultrasound for follow-up.  5-Tobacco abuse: Motivated to quit, continue with nicotine patch 6-Mild hyponatremia: Monitor. Stable.  7-Hemoptysis; related to lung mass, was on anticoagulation. Holding heparin. Hb stable. - No further hemoptysis.  8-A fib RVR;  -Recurrence of A fib RVR, Received IV metoprolol.  -Started  oral metoprolol. Started on Amiodarone Gtt. Transition to oral amiodarone. 9-BL pleural effusion;  Will give IV albumin and lasix today. Follow x ray.   Estimated body mass index is 18.97 kg/m as calculated from the following:   Height as of this encounter: 5\' 11"  (1.803 m).   Weight as of this encounter: 61.7 kg.   DVT prophylaxis: Lovenox Code Status: Full code Family Communication: Care discussed with patient, sister  33-13 Disposition Plan:  Status is: Inpatient  Remains inpatient appropriate because:Ongoing diagnostic testing needed not appropriate for outpatient work up   Dispo:  Patient From: Home  Planned Disposition: Home  Medically stable for discharge: No          Consultants:   Cardiothoracic surgery  Cardiology  Procedures:   Pericardial window 3/8  Antimicrobials:    Subjective: Complaining of pain at site of drain. Needs pain med. Mild dyspnea and cough  Objective: Vitals:   11/15/20 1928 11/15/20 2358 11/16/20 0339 11/16/20 0812  BP: (!) 139/99 113/81 120/83 106/75  Pulse: (!) 106 95 98 (!) 109  Resp: 14 19 14 18   Temp: 98.7 F (37.1 C) 98.8 F (37.1 C) 98.4 F (36.9 C)   TempSrc: Oral Oral Oral   SpO2: 95% 93% 93% 91%  Weight:      Height:        Intake/Output Summary (Last 24 hours) at 11/16/2020 0906 Last data filed at 11/16/2020 0715 Gross per 24 hour  Intake 240 ml  Output 420 ml  Net -180 ml   Filed Weights   11/10/20 0317  Weight: 61.7 kg    Examination:  General exam: NAD Respiratory system: CTA Cardiovascular system: S 1, S 2 RRR Subxiphoid drain in place bloody fluid Gastrointestinal system: BS present, soft, nt Central nervous system: alert Extremities: no edema Data Reviewed: I have personally reviewed following labs and imaging studies  CBC: Recent Labs  Lab 11/09/20 2242 11/10/20 1055 11/11/20 0416 11/12/20 0021 11/13/20 0828 11/14/20 0748 11/16/20 0106  WBC 8.1   < > 9.7 10.3 9.4 7.9 8.7  NEUTROABS 5.9  --   --   --   --   --   --   HGB 12.2*   < > 10.7* 10.3* 12.0* 11.8* 11.0*  HCT 35.3*   < > 29.8* 29.3* 34.4* 33.8* 31.9*  MCV 95.9   < > 93.4 93.9 93.7 93.4 93.3  PLT 288   < > 233 240 266 290 344   < > = values in this interval not displayed.   Basic Metabolic Panel: Recent Labs  Lab 11/12/20 0021 11/12/20 0054 11/13/20 0828 11/14/20 0748 11/15/20 0125 11/16/20 0106  NA 134*  --  132* 130* 131* 132*  K  3.5  --  3.9 4.0 4.2 4.3  CL 105  --  99 99 98 98  CO2 22  --  24 24 28 28   GLUCOSE 107*  --  101* 101* 110* 106*  BUN 9  --  10 12 11 9   CREATININE 0.81  --  0.77 0.72 0.90 0.83  CALCIUM 8.5*  --  8.6* 8.7* 8.4* 8.7*  MG  --  1.7 1.9  --   --   --    GFR: Estimated Creatinine Clearance: 84.7 mL/min (by C-G formula based on SCr of 0.83 mg/dL). Liver Function Tests: Recent Labs  Lab 11/09/20 2242 11/12/20 0021  AST 20 21  ALT 18 15  ALKPHOS 93 67  BILITOT 0.8 0.6  PROT 6.1* 5.0*  ALBUMIN 3.2* 2.6*   Recent Labs  Lab 11/10/20 0114  LIPASE 68*   No results for input(s): AMMONIA in the last 168 hours. Coagulation Profile: No results for input(s): INR, PROTIME in the last 168 hours. Cardiac Enzymes: No results for input(s): CKTOTAL, CKMB, CKMBINDEX, TROPONINI in the last 168 hours. BNP (last 3 results) No results for input(s): PROBNP in the last 8760 hours. HbA1C: No results for input(s): HGBA1C in the last 72  hours. CBG: Recent Labs  Lab 11/15/20 0639 11/15/20 1103 11/15/20 1550 11/15/20 2114 11/16/20 0636  GLUCAP 112* 128* 107* 102* 106*   Lipid Profile: No results for input(s): CHOL, HDL, LDLCALC, TRIG, CHOLHDL, LDLDIRECT in the last 72 hours. Thyroid Function Tests: No results for input(s): TSH, T4TOTAL, FREET4, T3FREE, THYROIDAB in the last 72 hours. Anemia Panel: No results for input(s): VITAMINB12, FOLATE, FERRITIN, TIBC, IRON, RETICCTPCT in the last 72 hours. Sepsis Labs: No results for input(s): PROCALCITON, LATICACIDVEN in the last 168 hours.  Recent Results (from the past 240 hour(s))  Resp Panel by RT-PCR (Flu A&B, Covid) Nasopharyngeal Swab     Status: None   Collection Time: 11/10/20  1:55 AM   Specimen: Nasopharyngeal Swab; Nasopharyngeal(NP) swabs in vial transport medium  Result Value Ref Range Status   SARS Coronavirus 2 by RT PCR NEGATIVE NEGATIVE Final    Comment: (NOTE) SARS-CoV-2 target nucleic acids are NOT DETECTED.  The SARS-CoV-2  RNA is generally detectable in upper respiratory specimens during the acute phase of infection. The lowest concentration of SARS-CoV-2 viral copies this assay can detect is 138 copies/mL. A negative result does not preclude SARS-Cov-2 infection and should not be used as the sole basis for treatment or other patient management decisions. A negative result may occur with  improper specimen collection/handling, submission of specimen other than nasopharyngeal swab, presence of viral mutation(s) within the areas targeted by this assay, and inadequate number of viral copies(<138 copies/mL). A negative result must be combined with clinical observations, patient history, and epidemiological information. The expected result is Negative.  Fact Sheet for Patients:  EntrepreneurPulse.com.au  Fact Sheet for Healthcare Providers:  IncredibleEmployment.be  This test is no t yet approved or cleared by the [place] FDA and  has been authorized for detection and/or diagnosis of SARS-CoV-2 by FDA under an Emergency Use Authorization (EUA). This EUA will remain  in effect (meaning this test can be used) for the duration of the COVID-19 declaration under Section 564(b)(1) of the Act, 21 U.S.C.section 360bbb-3(b)(1), unless the authorization is terminated  or revoked sooner.       Influenza A by PCR NEGATIVE NEGATIVE Final   Influenza B by PCR NEGATIVE NEGATIVE Final    Comment: (NOTE) The Xpert Xpress SARS-CoV-2/FLU/RSV plus assay is intended as an aid in the diagnosis of influenza from Nasopharyngeal swab specimens and should not be used as a sole basis for treatment. Nasal washings and aspirates are unacceptable for Xpert Xpress SARS-CoV-2/FLU/RSV testing.  Fact Sheet for Patients: EntrepreneurPulse.com.au  Fact Sheet for Healthcare Providers: IncredibleEmployment.be  This test is not yet approved or cleared by the  [place] FDA and has been authorized for detection and/or diagnosis of SARS-CoV-2 by FDA under an Emergency Use Authorization (EUA). This EUA will remain in effect (meaning this test can be used) for the duration of the COVID-19 declaration under Section 564(b)(1) of the Act, 21 U.S.C. section 360bbb-3(b)(1), unless the authorization is terminated or revoked.  Performed at [facility], [place] [address], [place], [place] [address]   MRSA PCR Screening     Status: None   Collection Time: 11/11/20  2:28 AM   Specimen: Nasal Mucosa; Nasopharyngeal  Result Value Ref Range Status   MRSA by PCR NEGATIVE NEGATIVE Final    Comment:        The GeneXpert MRSA Assay (FDA approved for NASAL specimens only), is one component of a comprehensive MRSA colonization surveillance program. It is not intended to diagnose MRSA  infection nor to guide or monitor treatment for MRSA infections. Performed at [facility], [place] [address], [place],  [address]          Radiology Studies: DG Chest Port 1 View  Result Date: 11/16/2020 CLINICAL DATA:  Pericardial effusion.  Chest pain EXAM: PORTABLE CHEST 1 VIEW COMPARISON:  November 14, 2020 chest radiograph and chest CT FINDINGS: Pericardial drain in place. The heart size and pulmonary vascularity appear within normal limits. There are pleural effusions bilaterally with bibasilar atelectasis. Fullness in the right paratracheal region is stable, consistent with adenopathy noted on recent CT. There is a degree of right hilar and azygos region adenopathy as well. No bone lesions. Postoperative change lower cervical region. IMPRESSION: Pericardial drain in place.  Heart size normal. Pleural effusions bilaterally with bibasilar atelectasis. Areas of adenopathy in the right paratracheal, azygos, and right hilar regions seen by radiography. Adenopathy in other areas better seen on CT. Electronically Signed   By: Lowella Grip III M.D.   On:  11/16/2020 07:59        Scheduled Meds: . amiodarone  200 mg Oral Q12H   Followed by  . [START ON November 29, 2020] amiodarone  200 mg Oral Daily  . Chlorhexidine Gluconate Cloth  6 each Topical Daily  . enoxaparin (LOVENOX) injection  40 mg Subcutaneous Daily  . insulin aspart  0-9 Units Subcutaneous TID WC  . mouth rinse  15 mL Mouth Rinse BID  . metoprolol tartrate  25 mg Oral BID  . nicotine  14 mg Transdermal Daily  . pantoprazole  40 mg Oral BID  . polyethylene glycol  17 g Oral Daily  . senna-docusate  1 tablet Oral BID   Continuous Infusions: . sodium chloride       LOS: 6 days    Time spent: 35 minutes.     Elmarie Shiley, MD [facility]   If 7PM-7AM, please contact night-coverage [email or web address]  11/16/2020, 9:06 AM

## 2020-11-16 NOTE — Progress Notes (Signed)
Ordered repeat outpatient Echo for follow-up of pericardial effusion. Please see Dr. Francesca Oman rounding note from today for more information.  Darreld Mclean, PA-C 11/16/2020 10:23 AM

## 2020-11-16 NOTE — Progress Notes (Addendum)
° °   °  [place]       [place] [id number]             [phone]        6 Days Post-Op Procedure(s) (LRB): PERICARDIAL WINDOW (N/A)  Subjective: Patient has some incisional pain this am  Objective: Vital signs in last 24 hours: Temp:  [97.8 F (36.6 C)-98.9 F (37.2 C)] 98.4 F (36.9 C) (03/14 0339) Pulse Rate:  [92-106] 98 (03/14 0339) Cardiac Rhythm: Sinus tachycardia (03/14 0701) Resp:  [13-19] 14 (03/14 0339) BP: (106-139)/(81-99) 120/83 (03/14 0339) SpO2:  [90 %-96 %] 93 % (03/14 0339)   Current Weight  11/10/20 61.7 kg       Intake/Output from previous day: 03/13 0701 - 03/14 0700 In: 240 [P.O.:240] Out: 420 [Urine:400; Chest Tube:20]   Physical Exam:  Cardiovascular: Tachycardic Pulmonary: Slightly diminished bibasilar breath sounds R>L Wound: Clean and dry.    Lab Results: CBC: Recent Labs    11/14/20 0748 11/16/20 0106  WBC 7.9 8.7  HGB 11.8* 11.0*  HCT 33.8* 31.9*  PLT 290 344   BMET:  Recent Labs    11/15/20 0125 11/16/20 0106  NA 131* 132*  K 4.2 4.3  CL 98 98  CO2 28 28  GLUCOSE 110* 106*  BUN 11 9  CREATININE 0.90 0.83  CALCIUM 8.4* 8.7*    PT/INR:  Lab Results  Component Value Date   INR 0.98 11/08/2015   ABG:  INR: Will add last result for INR, ABG once components are confirmed Will add last 4 CBG results once components are confirmed  Assessment/Plan:  1. CV - Previous a fib. ST with HR in the low 100's this am. On Amiodarone 200 mg bid and Lopressor 25 mg bid. Pericardial drain with 20 cc last 24 hours. Chest tube is to suction. CXR this am shows small right pleural effusion, no pneumothorax, right hilar enlargement (and adenopathy). Will remove drain. Pericardial effusion likely related to malignancy but await final cytology results 2.  Pulmonary - Heterogeneous, largely hypoattenuating focus in the tail of the pancreas measuring 2.5 x 2.1 cm, extensive centrally necrotic lymphadenopathy,  emphysema, necrotic appearing mass in the superior segment right lower lobe.  Encourage incentive spirometer 3.  Anemia-H and H this am stable at 11 and 31.9 4. 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 Cyndia Bent, low dose Lovenox ok but would avoid full anticoagulation right now   Sharalyn Ink [facility] 11/16/2020,7:06 AM   Chart reviewed, patient examined, agree with above. Pericardial drain has minimal output past 24 hrs. Will remove it today. The chest tube suture needs to stay in for 2 weeks. Path still pending

## 2020-11-17 ENCOUNTER — Telehealth: Payer: Self-pay | Admitting: *Deleted

## 2020-11-17 ENCOUNTER — Inpatient Hospital Stay (HOSPITAL_COMMUNITY): Payer: Medicaid Other

## 2020-11-17 ENCOUNTER — Encounter: Payer: Self-pay | Admitting: *Deleted

## 2020-11-17 DIAGNOSIS — R590 Localized enlarged lymph nodes: Secondary | ICD-10-CM

## 2020-11-17 DIAGNOSIS — R918 Other nonspecific abnormal finding of lung field: Secondary | ICD-10-CM

## 2020-11-17 LAB — BASIC METABOLIC PANEL
Anion gap: 8 (ref 5–15)
BUN: 10 mg/dL (ref 6–20)
CO2: 27 mmol/L (ref 22–32)
Calcium: 9 mg/dL (ref 8.9–10.3)
Chloride: 97 mmol/L — ABNORMAL LOW (ref 98–111)
Creatinine, Ser: 0.88 mg/dL (ref 0.61–1.24)
GFR, Estimated: >60 mL/min (ref >60)
Glucose, Bld: 113 mg/dL — ABNORMAL HIGH (ref 70–99)
Potassium: 3.7 mmol/L (ref 3.5–5.1)
Sodium: 132 mmol/L — ABNORMAL LOW (ref 135–145)

## 2020-11-17 LAB — GLUCOSE, CAPILLARY
Glucose-Capillary: 120 mg/dL — ABNORMAL HIGH (ref 70–99)
Glucose-Capillary: 131 mg/dL — ABNORMAL HIGH (ref 70–99)
Glucose-Capillary: 111 mg/dL — ABNORMAL HIGH (ref 70–99)
Glucose-Capillary: 108 mg/dL — ABNORMAL HIGH (ref 70–99)

## 2020-11-17 LAB — SURGICAL PATHOLOGY

## 2020-11-17 LAB — CYTOLOGY - NON PAP

## 2020-11-17 MED ORDER — ALBUMIN HUMAN 5 % IV SOLN
25.0000 g | Freq: Once | INTRAVENOUS | Status: AC
  Administered 2020-11-17: 25 g via INTRAVENOUS
  Filled 2020-11-17: qty 500

## 2020-11-17 MED ORDER — POTASSIUM CHLORIDE CRYS ER 20 MEQ PO TBCR
40.0000 meq | EXTENDED_RELEASE_TABLET | Freq: Once | ORAL | Status: AC
  Administered 2020-11-17: 40 meq via ORAL
  Filled 2020-11-17: qty 2

## 2020-11-17 MED ORDER — ENOXAPARIN SODIUM 40 MG/0.4ML ~~LOC~~ SOLN
40.0000 mg | SUBCUTANEOUS | Status: DC
  Administered 2020-11-19: 40 mg via SUBCUTANEOUS
  Filled 2020-11-17: qty 0.4

## 2020-11-17 MED ORDER — BOOST / RESOURCE BREEZE PO LIQD CUSTOM: 1.0000 | Freq: Three times a day (TID) | ORAL | Status: DC

## 2020-11-17 MED ORDER — FUROSEMIDE 10 MG/ML IJ SOLN
40.0000 mg | Freq: Once | INTRAMUSCULAR | Status: AC
  Administered 2020-11-17: 40 mg via INTRAVENOUS
  Filled 2020-11-17: qty 4

## 2020-11-17 NOTE — Evaluation (Signed)
Physical Therapy Evaluation Patient Details Name: Brian Leonard MRN: [id number] DOB: [birth year] Today's Date: 11/17/2020   History of Present Illness  60 y.o. male with pericardial tamponade, widespread metastasis, s/p pericardial window.  Left UE thrombus and afib with RVR. PMH: tobacco abuse  Clinical Impression  Pt admitted with above diagnosis. Pt was able to ambulate without device in the room with steady gait. Pt limited by fatigue and desats with activity without O2. Should progress well. 3Pt currently with functional limitations due to the deficits listed below (see PT Problem List). Pt will benefit from skilled PT to increase their independence and safety with mobility to allow discharge to the venue listed below.    SATURATION QUALIFICATIONS: (This note is used to comply with regulatory documentation for home oxygen)  Patient Saturations on Room Air at Rest = 86%  Patient Saturations on Room Air while Ambulating = NT as desats at rest on RA  Patient Saturations on 2 Liters of oxygen while Ambulating = 90%  Please briefly explain why patient needs home oxygen:Needs at least 2LO2 at rest and with activity to maintain O2 saturation.   Follow Up Recommendations Home health PT;Supervision - Intermittent    Equipment Recommendations  Cane (May need cane)    Recommendations for Other Services       Precautions / Restrictions Precautions Precautions: Fall Restrictions Weight Bearing Restrictions: No      Mobility  Bed Mobility Overal bed mobility: Independent                  Transfers Overall transfer level: Independent                  Ambulation/Gait Ambulation/Gait assistance: Supervision Gait Distance (Feet): 80 Feet Assistive device: None Gait Pattern/deviations: Step-through pattern;Decreased stride length   Gait velocity interpretation: <1.31 ft/sec, indicative of household ambulator General Gait Details: No issues with stability today.   No LOB within room ambulation.  Pt fatigues quickly.  Stairs            Wheelchair Mobility    Modified Rankin (Stroke Patients Only)       Balance                                             Pertinent Vitals/Pain Pain Assessment: No/denies pain    Home Living Family/patient expects to be discharged to:: Private residence Living Arrangements: Spouse/significant other Available Help at Discharge: Family;Other (Comment);Available 24 hours/day (sister and her husband) Type of Home: House Home Access: Stairs to enter Entrance Stairs-Rails: Psychiatric nurse of Steps: 4 Home Layout: One level Home Equipment: None      Prior Function Level of Independence: Independent               Hand Dominance   Dominant Hand: Right    Extremity/Trunk Assessment   Upper Extremity Assessment Upper Extremity Assessment: Defer to OT evaluation    Lower Extremity Assessment Lower Extremity Assessment: Generalized weakness    Cervical / Trunk Assessment Cervical / Trunk Assessment: Normal  Communication   Communication: No difficulties  Cognition Arousal/Alertness: Awake/alert Behavior During Therapy: WFL for tasks assessed/performed Overall Cognitive Status: Within Functional Limits for tasks assessed  General Comments General comments (skin integrity, edema, etc.): 97 bpm, 93% 2LO2, 14, 86% on RA with activity.     Exercises General Exercises - Lower Extremity Ankle Circles/Pumps: AROM;Both;10 reps;Supine Heel Slides: AROM;Both;10 reps;Supine   Assessment/Plan    PT Assessment Patient needs continued PT services  PT Problem List Decreased activity tolerance;Decreased balance;Decreased mobility;Decreased knowledge of use of DME;Decreased safety awareness;Decreased knowledge of precautions;Cardiopulmonary status limiting activity       PT Treatment Interventions DME  instruction;Gait training;Functional mobility training;Therapeutic activities;Therapeutic exercise;Balance training;Patient/family education;Stair training    PT Goals (Current goals can be found in the Care Plan section)  Acute Rehab PT Goals Patient Stated Goal: to go home PT Goal Formulation: With patient Time For Goal Achievement: 12/01/20 Potential to Achieve Goals: Good    Frequency Min 3X/week   Barriers to discharge        Co-evaluation               AM-PAC PT "6 Clicks" Mobility  Outcome Measure Help needed turning from your back to your side while in a flat bed without using bedrails?: None Help needed moving from lying on your back to sitting on the side of a flat bed without using bedrails?: None Help needed moving to and from a bed to a chair (including a wheelchair)?: None Help needed standing up from a chair using your arms (e.g., wheelchair or bedside chair)?: None Help needed to walk in hospital room?: A Little Help needed climbing 3-5 steps with a railing? : A Little 6 Click Score: 22    End of Session Equipment Utilized During Treatment: Gait belt;Oxygen Activity Tolerance: Patient limited by fatigue Patient left: in bed;with call bell/phone within reach Nurse Communication: Mobility status PT Visit Diagnosis: Muscle weakness (generalized) (M62.81)    Time: 1610-9604 PT Time Calculation (min) (ACUTE ONLY): 16 min   Charges:   PT Evaluation $PT Eval Moderate Complexity: 1 Mod          Dawn M,PT Acute Rehab Services [phone] (pager)  Alvira Philips 11/17/2020, 11:34 AM

## 2020-11-17 NOTE — Progress Notes (Signed)
Updated. Received message from Dr Lindi Adie, he discussed case with pathologist, Dr Lindi Adie concluded that lung is primary. No need for more tissue Biopsy.  I send message to PA from IR.   Belkys Regalado. MD

## 2020-11-17 NOTE — Plan of Care (Signed)

## 2020-11-17 NOTE — Consult Note (Signed)
NAME:  Brian Leonard, MRN:  [id number], DOB:  1961/02/17, LOS: 7 ADMISSION DATE:  11/09/2020, CONSULTATION DATE:  11/17/20 REFERRING MD:  Dr. Tyrell Antonio, CHIEF COMPLAINT:  Chest / back pain, cough   Brief History:  60 y/o M, smoker, admitted 3/7 with reports of chest / back pain and cough.  Work up consistent with pericardial effusion, RLL mass and necrotic adenopathy.  S/p pericardial window with malignant cells but not defined. PCCM consulted for evaluation.   History of Present Illness:  59 y/o M, smoker, admitted 3/7 with reports of chest / back pain and cough.  The patient reported on admit that he chronically has back pain but had been worse over the preceding month. He also noted other additional joint aches and a pruritic rash on his BUE's.  He continued to smoke up to admission.  He lost his medical insurance and has not seen a healthcare provider in the last 10 years.  He reports his baseline weight around 147 lbs, currently 135lbs.   Initial work up notable for negative COVID testing.  He was afebrile & hemodynamically stable.  Exam was notable for a central discoloration of the tongue and erythematous rash of the BUE's (see images).  CXR demonstrated a soft tissue fullness in the hilum.  Follow up CT was ordered for evaluation which showed extensive centrally necrotic LAN seen in the supraclavicular chains, mediastinum, & hila, R>L with a necrotic appearing mass in the superior segment RLL, numerous scattered solid micro nodules throughout both lungs, suspected malignant pericardial effusion, bony lesions in spine.  TCTS consulted for evaluation. He underwent a subxyphoid pericardial window on 3/8 with pathology of pericardial fluid / pericardium consistent with metastatic adenocarcinoma. Clear source not defined, differential includes lung, upper GI and pancreatobiliary as primary source.    PCCM consulted for assistance with tissue diagnosis.   Past Medical History:  DDD s/p cervical  spine fusion Asthma  Tobacco / [facility] Events:  3/07 Admit  3/15 PCCM consulted   Consults:  ONC  TCTS  Procedures:    Significant Diagnostic Tests:   CT Chest, ABD/Pelvis w/ 3/8 >> extensive centrally necrotic LAN seen in the supraclavicular chains, mediastinum, & hila, R>L with a necrotic appearing mass in the superior segment RLL, numerous scattered solid micro nodules throughout both lungs, suspected malignant pericardial effusion, bony lesions in spine, hypoattenuating focus in the tail of the pancreas  ECHO 3/8 >> LVEF 69-62%, RV systolic function normal, moderate pericardial effusion with tamponade physiology  Pericardial Window Fluid 3/8 >> metastatic adenocarcinoma  Pericardium Biopsy 3/8 >> metastatic adenocarcinoma   Micro Data:  COVID 3/8 >> negative  AFB 3/9 >>  MRSA PCR 3/9 >> negative  Antimicrobials:  Ancef 3/8 >> 3/8  Interim History / Subjective:  Afebrile  Pt reports feeling bad, back pain worse than usual  Unable to tolerate Ensure due to secretions  Objective   Blood pressure (!) 115/94, pulse (!) 103, temperature 98 F (36.7 C), temperature source Oral, resp. rate 16, height 5\' 11"  (1.803 m), weight 61.7 kg, SpO2 92 %.        Intake/Output Summary (Last 24 hours) at 11/17/2020 1519 Last data filed at 11/17/2020 1417 Gross per 24 hour  Intake 240 ml  Output 600 ml  Net -360 ml   Filed Weights   11/10/20 0317  Weight: 61.7 kg    Examination: General: cachectic adult male lying in bed in NAD  HEENT: MM pink/moist, edentulous, temporal wasting,  anicteric, [place] O2, palpable supraclavicular LAN on right, [place] assessment with large necrotic LN noted Neuro: AAOx4, speech clear, MAE CV: s1s2 RRR, no m/r/g PULM: non-labored on [place] O2, lungs bilaterally diminished GI: soft, bsx4 active  Extremities: warm/dry, no edema  Skin: no rashes or lesions  Resolved Hospital Problem list     Assessment & Plan:   Mediastinal  Necrotic Lymphadenopathy Pulmonary Nodules Pericardial Effusion with Metastatic Adenocarcinoma Acute Hypoxemic Respiratory Failure  Emphysema / Hyperinflation on CXR, presumed COPD without Exacerbation    Exact primary not identified. Biopsy of pericardium and pericardial fluid consistent with metastatic adenocarcinoma.  Primary differential lung, GI, pancreatobiliary.   - IR consult for biopsy of right supraclavicular LN for tissue sampling  - Follow cultures / AFB  - ONC evaluation once tissue sampling confirms primary  - Pulmonary hygiene - IS, mobilize  - Wean O2 off for sats >90%  Severe Protein Calorie Malnutrition  - Add Resource Breeze, pt does not tolerate Ensure with secretions  We are available PRN  Best practice (evaluated daily)  Diet: as tolerated  Pain/Anxiety/Delirium protocol (if indicated): n/a  VAP protocol (if indicated): n/a DVT prophylaxis: per primary  GI prophylaxis: per primary  Glucose control: per primary Mobility: as tolerated  Disposition: per primary   Goals of Care:  Last date of multidisciplinary goals of care discussion: consider Palliative Care consultation  Family and staff present: n/a  Summary of discussion: n/a  Follow up goals of care discussion due:  Code Status: Full Code   Labs   CBC: Recent Labs  Lab 11/11/20 0416 11/12/20 0021 11/13/20 0828 11/14/20 0748 11/16/20 0106  WBC 9.7 10.3 9.4 7.9 8.7  HGB 10.7* 10.3* 12.0* 11.8* 11.0*  HCT 29.8* 29.3* 34.4* 33.8* 31.9*  MCV 93.4 93.9 93.7 93.4 93.3  PLT 233 240 266 290 696    Basic Metabolic Panel: Recent Labs  Lab 11/12/20 0054 11/13/20 0828 11/14/20 0748 11/15/20 0125 11/16/20 0106 11/17/20 0110  NA  --  132* 130* 131* 132* 132*  K  --  3.9 4.0 4.2 4.3 3.7  CL  --  99 99 98 98 97*  CO2  --  24 24 28 28 27   GLUCOSE  --  101* 101* 110* 106* 113*  BUN  --  10 12 11 9 10   CREATININE  --  0.77 0.72 0.90 0.83 0.88  CALCIUM  --  8.6* 8.7* 8.4* 8.7* 9.0  MG 1.7 1.9   --   --   --   --    GFR: Estimated Creatinine Clearance: 79.9 mL/min (by C-G formula based on SCr of 0.88 mg/dL). Recent Labs  Lab 11/12/20 0021 11/13/20 0828 11/14/20 0748 11/16/20 0106  WBC 10.3 9.4 7.9 8.7    Liver Function Tests: Recent Labs  Lab 11/12/20 0021  AST 21  ALT 15  ALKPHOS 67  BILITOT 0.6  PROT 5.0*  ALBUMIN 2.6*   No results for input(s): LIPASE, AMYLASE in the last 168 hours. No results for input(s): AMMONIA in the last 168 hours.  ABG    Component Value Date/Time   PHART 7.242 (L) 11/10/2020 1952   PCO2ART 50.5 (H) 11/10/2020 1952   PO2ART 438 (H) 11/10/2020 1952   HCO3 22.1 11/10/2020 1952   TCO2 24 11/10/2020 1952   ACIDBASEDEF 5.0 (H) 11/10/2020 1952   O2SAT 100.0 11/10/2020 1952     Coagulation Profile: No results for input(s): INR, PROTIME in the last 168 hours.  Cardiac Enzymes: No results for input(s):  CKTOTAL, CKMB, CKMBINDEX, TROPONINI in the last 168 hours.  HbA1C: Hgb A1c MFr Bld  Date/Time Value Ref Range Status  11/11/2020 05:50 AM 5.9 (H) 4.8 - 5.6 % Final    Comment:    (NOTE) Pre diabetes:          5.7%-6.4%  Diabetes:              >6.4%  Glycemic control for   <7.0% adults with diabetes     CBG: Recent Labs  Lab 11/16/20 1116 11/16/20 1601 11/16/20 2122 11/17/20 0635 11/17/20 1058  GLUCAP 109* 99 103* 120* 131*    Review of Systems: Positives in [place]  Gen: Denies fever, chills, weight change (147 > 135lbs), fatigue, night sweats HEENT: Denies blurred vision, double vision, hearing loss, tinnitus, sinus congestion, rhinorrhea, sore throat, neck stiffness, dysphagia PULM: Denies shortness of breath, cough, sputum production, hemoptysis, wheezing CV: Denies chest pain, edema, orthopnea, paroxysmal nocturnal dyspnea, palpitations GI: Denies abdominal pain, nausea, vomiting, diarrhea, hematochezia, melena, constipation, change in bowel habits GU: Denies dysuria, hematuria, polyuria, oliguria, urethral  discharge Endocrine: Denies hot or cold intolerance, polyuria, polyphagia or appetite change Derm: Denies rash, dry skin, scaling or peeling skin change Heme: Denies easy bruising, bleeding, bleeding gums Neuro: Denies headache, numbness, weakness, slurred speech, loss of memory or consciousness MSK: Back pain  Past Medical History:  He,  has a past medical history of Asthma.   Surgical History:   Past Surgical History:  Procedure Laterality Date  . CERVICAL SPINE SURGERY    . PERICARDIAL WINDOW N/A 11/10/2020   Procedure: PERICARDIAL WINDOW;  Surgeon: Gaye Pollack, MD;  Location: [facility] OR;  Service: Thoracic;  Laterality: N/A;     Social History:   reports that he has been smoking. He has never used smokeless tobacco. He reports current alcohol use. He reports current drug use. Drug: Marijuana.   Family History:  His family history is not on file.   Allergies Allergies  Allergen Reactions  . Iodine Anaphylaxis  . Shellfish Allergy Anaphylaxis  . Other Hives and Swelling    Reaction to unknown oral asthma medication (pt has taken Theo Dur in the past - 10 yrs ago with no reaction)     Home Medications  Prior to Admission medications   Medication Sig Start Date End Date Taking? Authorizing Provider  acetaminophen (TYLENOL) 500 MG tablet Take 1,000 mg by mouth every 6 (six) hours as needed for headache or moderate pain.   Yes [provider]  ibuprofen (ADVIL) 200 MG tablet Take 400 mg by mouth every 6 (six) hours as needed for headache or moderate pain.   Yes [provider]  Multiple Vitamin (MULTIVITAMIN WITH MINERALS) TABS tablet Take 1 tablet by mouth daily.   Yes [provider]     Critical care time: n/a    Erskine Emery MD PCCM

## 2020-11-17 NOTE — Progress Notes (Signed)
SATURATION QUALIFICATIONS: (This note is used to comply with regulatory documentation for home oxygen)  Patient Saturations on Room Air at Rest = 86%  Patient Saturations on Room Air while Ambulating = NT as desats at rest on RA  Patient Saturations on 2 Liters of oxygen while Ambulating = 90%  Please briefly explain why patient needs home oxygen:Needs at least 2LO2 at rest and with activity to maintain O2 saturation.  Alyna Stensland M,PT Acute Rehab Services [phone] (pager)

## 2020-11-17 NOTE — Telephone Encounter (Signed)
I received referral on Brian Leonard. I called and spoke to him. He is still in the hospital but looks like he may get out today or tomorrow.  I updated on appt to see Dr. Julien Nordmann Friday this week.

## 2020-11-17 NOTE — Plan of Care (Signed)

## 2020-11-17 NOTE — Progress Notes (Addendum)
      [address]       [place] [address]             [phone]        7 Days Post-Op Procedure(s) (LRB): PERICARDIAL WINDOW (N/A)  Subjective: Patient states pain is much less now that "tube is out".  Objective: Vital signs in last 24 hours: Temp:  [97.8 F (36.6 C)-98.7 F (37.1 C)] 97.8 F (36.6 C) (03/15 0336) Pulse Rate:  [81-109] 103 (03/15 0336) Cardiac Rhythm: Normal sinus rhythm (03/15 0336) Resp:  [12-18] 14 (03/15 0336) BP: (101-132)/(75-96) 101/80 (03/15 0336) SpO2:  [91 %-96 %] 94 % (03/15 0336)   Current Weight  11/10/20 61.7 kg       Intake/Output from previous day: 03/14 0701 - 03/15 0700 In: 86.5 [IV Piggyback:86.5] Out: 700 [Urine:700]   Physical Exam:  Cardiovascular: RRR Pulmonary: Slightly diminished bibasilar breath sounds R>L Wound: Clean and dry.  Trace sero sanguinous drainage from chest tube wound. No sign of infection  Lab Results: CBC: Recent Labs    11/14/20 0748 11/16/20 0106  WBC 7.9 8.7  HGB 11.8* 11.0*  HCT 33.8* 31.9*  PLT 290 344   BMET:  Recent Labs    11/16/20 0106 11/17/20 0110  NA 132* 132*  K 4.3 3.7  CL 98 97*  CO2 28 27  GLUCOSE 106* 113*  BUN 9 10  CREATININE 0.83 0.88  CALCIUM 8.7* 9.0    PT/INR:  Lab Results  Component Value Date   INR 0.98 11/08/2015   ABG:  INR: Will add last result for INR, ABG once components are confirmed Will add last 4 CBG results once components are confirmed  Assessment/Plan:  1. CV - Previous a fib. SR with HR in the 90's this am. On Amiodarone 200 mg bid and Lopressor 25 mg bid. Await PA/LAT CXR result this am as not taken yet. Pericardial effusion likely related to malignancy but await final cytology results 2.  Pulmonary - On 2 liters of oxygen via [place]. Wean as able. Heterogeneous, largely hypoattenuating focus in the tail of the pancreas measuring 2.5 x 2.1 cm, extensive centrally necrotic lymphadenopathy, emphysema, necrotic appearing  mass in the superior segment right lower lobe. Patient will see Dr. Julien Nordmann after discharge. Encourage incentive spirometer 3.  Anemia-Last H and H  at 11 and 31.9 4. 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 Cyndia Bent, low dose Lovenox ok but would avoid full anticoagulation right now 5. Chest tube suture will be removed in the office after discharge. Follow up appointment arranged. Will see PRN  Sharalyn Ink ZimmermanPA-C 11/17/2020,7:05 AM    CXR reviewed: no significant left pleural effusion.  Right sided pleural effusion small to moderate  Okay to d/c from surgery standpoint.  If his right sided effusion enlarges further he may benefit from Thoracentesis in the future.  Care per medicine, will sign off   Ellwood Handler, PA-C

## 2020-11-17 NOTE — Consult Note (Signed)
Chief Complaint: Patient was seen in consultation today for right supraclavicular mass/biopsy.  Referring Physician(s): Candee Furbish (PCCM)  Supervising Physician: Jacqulynn Cadet  Patient Status: [facility] - In-pt  History of Present Illness: [facility] is a 60 y.o. male with a past medical history of asthma and tobacco use who presented to [facility] 11/09/2020 with complaints of chest pain, back pain, and cough. In ED, CT chest concerning for extensive necrotic adenopathy, necrotic right lower lobe mass, numerous scattered micronodules throughout bilateral lungs, pericardial effusion, bone lesions involving T9 and bilateral ribs, suspected thrombus in the left brachiocephalic vein, and partially visualized pancreatic tail lesion. He was admitted for further management. TCTS was consulted on admission. Patient was taken to OR 11/10/2020 for pericardial window (pericardial drain removed 3/14). 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 Oncology was consulted who has plans for close OP F/U. However, primary still unknown. CCM was consulted regarding possible biopsy who recommended IR consult for possible right supraclavicular mass biopsy.  CT abdomen/pelvis 11/10/2020: 1. No CT evidence of lymphadenopathy soft tissue metastatic disease in the abdomen or pelvis. 2. Small lucent and rim sclerotic lesions noted in the bony pelvis, suspicious for subtle osseous metastatic disease given the presence of a presumed metastatic lesion of the T9 vertebral body. PET-CT is more sensitive for the detection of subtle metabolically active osseous metastatic disease. 3. Small volume ascites throughout the abdomen and pelvis, nonspecific. 4. Periportal edema and pericholecystic fluid, nonspecific in the setting of ascites. 5. Small right pleural effusion, increased compared to prior examination. 6. Large pericardial effusion, similar to prior examination.  CT chest 11/10/2020: 1.  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 Differential considerations could include a primary pulmonary malignancy with widespread metastases versus metastatic disease from a partially visualized pancreatic tail lesion. Alternatively, atypical infection such as mycobacterial/tuberculosis should be excluded as well. 2. Trace right pleural effusion. 3. Reflux of contrast in the hepatic veins and IVC could reflect some elevated right heart pressures/right-sided dysfunction. Correlate with echocardiogram particularly given the presence pericardial effusion. 4. Hypoattenuating filling defect is seen in the left brachiocephalic vein, unclear if this could reflect extension from the nodal disease or merely bland thrombus with compression of the left brachiocephalic vein as it passes through the extensive prevascular deposits. No large central or lobar filling defects are seen within the pulmonary arteries though evaluation limited on this non tailored examination. 5. Lucent lesion involving the T9 anterior inferior vertebral body with some mild soft tissue extension. Additional lucent lesion in the posterior left ninth rib. Additional ill defined sclerotic change in the right seventh rib laterally. 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 6. Indeterminate hyperattenuating focus in the posterior right lobe liver measuring 1.3 x 1.3 cm, possible flash filling hemangioma though should be viewed with conspicuity given additional findings. 7. Hypoattenuating 1.7 cm nodule in the thyroid isthmus, Recommend thyroid US (ref: J Am Coll Radiol. 2015 Feb;12(2): 143-50). 8. Aberrant right subclavian artery. 9. Emphysema  (ICD10-J43.9).  IR consulted by Dr. Tamala Julian for possible image-guided right supraclavicular mass biopsy. Patient awake and alert laying in bed. Complains of chest pain secondary to recent procedure, stable, rated 6/10 at this time. Complains of back pain, rated 9/10 at this time. Denies fever, chills, dyspnea, abdominal pain, or  headache.   Past Medical History:  Diagnosis Date  . Asthma     Past Surgical History:  Procedure Laterality Date  . CERVICAL SPINE SURGERY    . PERICARDIAL WINDOW N/A 11/10/2020   Procedure: PERICARDIAL WINDOW;  Surgeon: Gaye Pollack, MD;  Location: [facility] OR;  Service: Thoracic;  Laterality: N/A;    Allergies: Iodine, Shellfish allergy, and Other  Medications: Prior to Admission medications   Medication Sig Start Date End Date Taking? Authorizing Provider  acetaminophen (TYLENOL) 500 MG tablet Take 1,000 mg by mouth every 6 (six) hours as needed for headache or moderate pain.   Yes [provider]  ibuprofen (ADVIL) 200 MG tablet Take 400 mg by mouth every 6 (six) hours as needed for headache or moderate pain.   Yes [provider]  Multiple Vitamin (MULTIVITAMIN WITH MINERALS) TABS tablet Take 1 tablet by mouth daily.   Yes [provider]     History reviewed. No pertinent family history.  Social History   Socioeconomic History  . Marital status: Married    Spouse name: Not on file  . Number of children: Not on file  . Years of education: Not on file  . Highest education level: Not on file  Occupational History  . Not on file  Tobacco Use  . Smoking status: Current Every Day Smoker  . Smokeless tobacco: Never Used  Substance and Sexual Activity  . Alcohol use: Yes  . Drug use: Yes    Types: Marijuana  . Sexual activity: Not on file  Other Topics Concern  . Not on file  Social History Narrative  . Not on file   Social Determinants of Health   Financial Resource Strain: Not on file  Food Insecurity: Not on  file  Transportation Needs: Not on file  Physical Activity: Not on file  Stress: Not on file  Social Connections: Not on file     Review of Systems: A 12 point ROS discussed and pertinent positives are indicated in the HPI above.  All other systems are negative.  Review of Systems  Constitutional: Negative for chills and fever.  Respiratory: Negative for shortness of breath and wheezing.   Cardiovascular: Positive for chest pain. Negative for palpitations.  Gastrointestinal: Negative for abdominal pain.  Musculoskeletal: Positive for back pain.  Neurological: Negative for headaches.  Psychiatric/Behavioral: Negative for behavioral problems and confusion.    Vital Signs: BP (!) 115/94   Pulse (!) 103   Temp 98 F (36.7 C) (Oral)   Resp 16   Ht 5\' 11"  (1.803 m)   Wt 136 lb (61.7 kg)   SpO2 92%   BMI 18.97 kg/m   Physical Exam Vitals and nursing note reviewed.  Constitutional:      General: He is not in acute distress. Cardiovascular:     Rate and Rhythm: Normal rate and regular rhythm.     Heart sounds: Normal heart sounds. No murmur heard.   Pulmonary:     Effort: Pulmonary effort is normal. No respiratory distress.     Breath sounds: Normal breath sounds. No wheezing.  Skin:    General: Skin is warm and dry.  Neurological:     Mental Status: He is alert and oriented to person, place, and time.      MD Evaluation Airway: WNL Heart: WNL Abdomen: WNL Chest/ Lungs: WNL ASA  Classification: 3 Mallampati/Airway Score: Two   Imaging: DG Chest 2 View  Result Date: 11/17/2020 CLINICAL DATA:  Pericardial drain removed.  EXAM: CHEST - 2 VIEW COMPARISON:  11/16/2020 and multiple previous FINDINGS: Pericardial drain is been removed. Lead artifacts overlie the mediastinum and heart. Small left effusion with left lower lobe volume loss. This is slightly improved. Small to moderate size right effusion with right lower lung volume loss, also improved. Emphysema and  scarring again noted at both lung apices. IMPRESSION: Interval removal of pericardial drain. Slight improvement in small left effusion and left lower lobe volume loss. Small to moderate right effusion with right lower lung volume loss, also slightly improved. Electronically Signed   By: Nelson Chimes M.D.   On: 11/17/2020 08:47   DG Chest 2 View  Result Date: 11/09/2020 CLINICAL DATA:  Back pain and productive cough. EXAM: CHEST - 2 VIEW COMPARISON:  None. FINDINGS: Lungs are hyperexpanded. Soft tissue fullness noted in the right hilum. Left lung unremarkable. No pleural effusion. The visualized bony structures of the thorax show no acute abnormality. IMPRESSION: Soft tissue fullness in the right hilum. CT chest with contrast recommended to further evaluate as lymphadenopathy/neoplasm a concern. Electronically Signed   By: Misty Stanley M.D.   On: 11/09/2020 20:21   CT Chest W Contrast  Addendum Date: 11/10/2020   ADDENDUM REPORT: 11/10/2020 01:06 ADDENDUM: Additional small centrally necrotic appearing deposits appear to be located either along or adjacent the right hemidiaphragm, either within the lung base or subphrenic space (3/150). Addendum was called by telephone at the time of interpretation on 11/10/2020 at 1:06 am to provider GARRETT GREEN , who verbally acknowledged these results. Electronically Signed   By: Lovena Le M.D.   On: 11/10/2020 01:06   Result Date: 11/10/2020 CLINICAL DATA:  Abnormal radiograph, concern for mass EXAM: CT CHEST WITH CONTRAST TECHNIQUE: Multidetector CT imaging of the chest was performed during intravenous contrast administration. CONTRAST:  57mL OMNIPAQUE IOHEXOL 300 MG/ML  SOLN COMPARISON:  Radiograph 11/09/2020 FINDINGS: Cardiovascular: Normal cardiac size. Intermediate attenuation pericardial effusion, possibly malignant given the bulky mediastinal and hilar nodal disease. The aortic root is suboptimally assessed given cardiac pulsation artifact. The aorta is normal  caliber. Aberrant right subclavian artery with small diverticulum of Kommerell. Proximal great vessels are otherwise unremarkable. Central pulmonary arteries are top-normal caliber. No large central or lobar filling defects within limitations of this non tailored examination of the pulmonary arteries. Reflux of contrast into the IVC and hepatic veins. Hypoattenuating filling defect is seen extending from the within the left brachiocephalic vein 1/27-51, clear this reflects direct extension or merely thrombus in the setting of compression by the bulky disease Mediastinum/Nodes: Extensive mediastinal, hilar and supraclavicular adenopathy throughout the chest bilaterally. Some central hypoattenuation concerning necrotic adenopathy. Largest supraclavicular nodal deposit noted on the right measuring up to 3.1 x 3 cm in size (3/23). Paratracheal node measuring 3.3 x 1.9 cm (3/70). Right hilar deposit measuring 3.3 x 3.2 cm (3/90). Conglomerate adenopathy noted in the AP window and prevascular space as well. No acute abnormality of the trachea or esophagus. Hypoattenuating nodule in the thyroid isthmus measuring up to 1.7 cm in size. Lungs/Pleura: Centrally hypoattenuating lobular mass lesion seen in the posterosuperior segment right lower lobe measuring approximately 2.3 x 2.4 cm in size (4/93). Additional smaller nodules are seen throughout both lungs. Findings are on a background of diffuse centrilobular and paraseptal emphysematous changes as well as bronchitic features with airways thickening and scattered secretions. Regions of bandlike scarring and/or atelectasis are noted throughout both lungs. Trace right effusion with adjacent atelectasis. No left effusion. Upper Abdomen: Heterogeneous, largely hypoattenuating  focus in the tail of the pancreas measuring 2.5 x 2.1 cm (3/21). Indeterminate hyperattenuating focus in the posterior right lobe liver measuring 1.3 x 1.3 cm (3/182), possible flash filling hemangioma  though should be viewed with conspicuity given additional findings. No other acute abnormality in the upper abdomen. Musculoskeletal: Lucent lesion seen involving the T9 anterior inferior vertebral body with some mild soft tissue extension. Additional lucent lesion in the posterior left ninth rib (3/114) some ill defined sclerotic change in the right seventh rib laterally. No other consider acute or conspicuous osseous lesions. IMPRESSION: 1. 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 Differential considerations could include a primary pulmonary malignancy with widespread metastases versus metastatic disease from a partially visualized pancreatic tail lesion. Alternatively, atypical infection such as mycobacterial/tuberculosis should be excluded as well. 2. Trace right pleural effusion. 3. Reflux of contrast in the hepatic veins and IVC could reflect some elevated right heart pressures/right-sided dysfunction. Correlate with echocardiogram particularly given the presence pericardial effusion. 4. Hypoattenuating filling defect is seen in the left brachiocephalic vein, unclear if this could reflect extension from the nodal disease or merely bland thrombus with compression of the left brachiocephalic vein as it passes through the extensive prevascular deposits. No large central or lobar filling defects are seen within the pulmonary arteries though evaluation limited on this non tailored examination. 5. Lucent lesion involving the T9 anterior inferior vertebral body with some mild soft tissue extension. Additional lucent lesion in the posterior left ninth rib. Additional ill defined sclerotic change in the right seventh rib laterally. Findings  are concerning for osseous metastatic disease. 6. Indeterminate hyperattenuating focus in the posterior right lobe liver measuring 1.3 x 1.3 cm, possible flash filling hemangioma though should be viewed with conspicuity given additional findings. 7. Hypoattenuating 1.7 cm nodule in the thyroid isthmus, Recommend thyroid US (ref: J Am Coll Radiol. 2015 Feb;12(2): 143-50). 8. Aberrant right subclavian artery. 9. Emphysema (ICD10-J43.9). These results were called by telephone at the time of interpretation on 11/10/2020 at 12:49 am to provider PA McDonald, who verbally acknowledged these results. Electronically Signed: By: Lovena Le M.D. On: 11/10/2020 00:51   CT ABDOMEN PELVIS W CONTRAST  Result Date: 11/10/2020 CLINICAL DATA:  Metastatic disease evaluation EXAM: CT ABDOMEN AND PELVIS WITH CONTRAST TECHNIQUE: Multidetector CT imaging of the abdomen and pelvis was performed using the standard protocol following bolus administration of intravenous contrast. CONTRAST:  126mL OMNIPAQUE IOHEXOL 300 MG/ML  SOLN COMPARISON:  Same-day CT chest, 11/10/2020 FINDINGS: Lower chest: Small right pleural effusion, increased compared to prior examination. Large pericardial effusion, similar to prior. Hepatobiliary: No solid liver abnormality is seen. Periportal edema. Pericholecystic fluid, nonspecific in the setting of ascites. No gallstones, gallbladder wall thickening, or biliary dilatation. Pancreas: Unremarkable. No pancreatic ductal dilatation or surrounding inflammatory changes. Spleen: Normal in size without significant abnormality. Adrenals/Urinary Tract: Adrenal glands are unremarkable. Kidneys are normal, without renal calculi, solid lesion, or hydronephrosis. Bladder is unremarkable. Stomach/Bowel: Stomach is within normal limits. Appendix appears normal. No evidence of bowel wall thickening, distention, or inflammatory changes. Vascular/Lymphatic: Aortic atherosclerosis. No enlarged abdominal or pelvic lymph nodes.  Reproductive: No mass or other significant abnormality. Other: No abdominal wall hernia or abnormality. Small volume ascites throughout the abdomen and pelvis. Musculoskeletal: Small lucent and rim sclerotic lesions noted in  the pelvis, for example in the right ilium (series 3, image 54, 57). IMPRESSION: 1. No CT evidence of lymphadenopathy soft tissue metastatic disease in the abdomen or pelvis. 2. Small lucent and rim sclerotic lesions noted in the bony pelvis, suspicious for subtle osseous metastatic disease given the presence of a presumed metastatic lesion of the T9 vertebral body. PET-CT is more sensitive for the detection of subtle metabolically active osseous metastatic disease. 3. Small volume ascites throughout the abdomen and pelvis, nonspecific. 4. Periportal edema and pericholecystic fluid, nonspecific in the setting of ascites. 5. Small right pleural effusion, increased compared to prior examination. 6. Large pericardial effusion, similar to prior examination. Aortic Atherosclerosis (ICD10-I70.0). Electronically Signed   By: Eddie Candle M.D.   On: 11/10/2020 12:46   DG Chest Port 1 View  Result Date: 11/16/2020 CLINICAL DATA:  Pericardial effusion.  Chest pain EXAM: PORTABLE CHEST 1 VIEW COMPARISON:  November 14, 2020 chest radiograph and chest CT FINDINGS: Pericardial drain in place. The heart size and pulmonary vascularity appear within normal limits. There are pleural effusions bilaterally with bibasilar atelectasis. Fullness in the right paratracheal region is stable, consistent with adenopathy noted on recent CT. There is a degree of right hilar and azygos region adenopathy as well. No bone lesions. Postoperative change lower cervical region. IMPRESSION: Pericardial drain in place.  Heart size normal. Pleural effusions bilaterally with bibasilar atelectasis. Areas of adenopathy in the right paratracheal, azygos, and right hilar regions seen by radiography. Adenopathy in other areas better seen  on CT. Electronically Signed   By: Lowella Grip III M.D.   On: 11/16/2020 07:59   DG CHEST PORT 1 VIEW  Result Date: 11/14/2020 CLINICAL DATA:  pain, chest pain and cough.  Tobacco use. EXAM: PORTABLE CHEST 1 VIEW COMPARISON:  Chest x-ray 11/13/2020, CT chest 11/10/2020, CT chest 11/10/2020 FINDINGS: Hyperinflation with cystic changes consistent with emphysema. The heart size and mediastinal contours are unchanged. Persistent right hilar prominence. Bilateral trace to small volume pleural effusions, likely right greater than left. Right lower lobe opacity. No pneumothorax. No acute osseous abnormality. IMPRESSION: 1. Similar findings with persistent right lower lung zone opacity as well as bilateral trace to small volume pleural effusions, likely right greater than left. 2.  Emphysema (ICD10-J43.9). Electronically Signed   By: Iven Finn M.D.   On: 11/14/2020 06:59   DG CHEST PORT 1 VIEW  Result Date: 11/13/2020 CLINICAL DATA:  Pericardial effusion EXAM: PORTABLE CHEST 1 VIEW COMPARISON:  November 12, 2020 FINDINGS: Apparent pericardial drain present, unchanged in position. Heart size within normal limits. Pulmonary vascularity within normal limits. There are pleural effusions bilaterally with consolidation in the left lower lobe. There is apparent bullous disease in the left apex region. No adenopathy evident. Postoperative change lower cervical region. IMPRESSION: Apparent pericardial drain present. Heart size within normal limits. Pleural effusions bilaterally. Left lower lobe consolidation may in part be due to atelectasis; a degree of superimposed pneumonia in the left lower lobe cannot be excluded. Bullous disease noted left apex. Electronically Signed   By: Lowella Grip III M.D.   On: 11/13/2020 08:32   DG CHEST PORT 1 VIEW  Result Date: 11/12/2020 CLINICAL DATA:  Exudative pericardial effusion status post pericardial window EXAM: PORTABLE CHEST 1 VIEW COMPARISON:  11/11/2020 FINDINGS:  Right basilar consolidation and associated small right pleural effusion is stable. Mild interval improvement in interstitial infiltrate within the right upper lobe. No typical biapical cicatricial change or cavitary lesions identified to suggest granulomatous infection. Small  left basilar pleural effusion is again noted. No pneumothorax. Cardiac size within normal limits. Right hilar enlargement again seen in keeping with right hilar adenopathy better seen on prior CT examination. IMPRESSION: Minimally improved right lung pulmonary infiltrate. Right basilar consolidation and associated small right pleural effusion again noted. Small left pleural effusion present. Enlargement of the right hilum related to adenopathy unchanged. Electronically Signed   By: Fidela Salisbury MD   On: 11/12/2020 06:29   DG CHEST PORT 1 VIEW  Result Date: 11/11/2020 CLINICAL DATA:  Hemoptysis.  Pericardial effusion. EXAM: PORTABLE CHEST 1 VIEW COMPARISON:  11/10/2020.  CT 11/10/2020. FINDINGS: Mediastinum and right hilar fullness again noted consistent with previously visualized adenopathy. Right base infiltrate again noted. Reference is made to recent chest CT report for discussion of extensive findings throughout the chest. No prominent pleural effusion. No pneumothorax. Pericardial drain in stable position. Heart size stable. No pulmonary venous congestion. Prior cervical spine fusion. Reference is made to chest CT report for discussion of bone lesions present. IMPRESSION: 1. Mediastinal and right hilar fullness again noted consistent with previously visualized adenopathy. Right base infiltrate again noted. No interim change. 2. Pericardial drain in stable position. No pneumothorax. Heart size stable. No pulmonary venous congestion. Chest is unchanged from prior exam. Reference is made to recent chest CT report for discussion of extensive findings throughout the chest. Electronically Signed   By: Marcello Moores  Register   On: 11/11/2020 08:29    DG Chest Port 1 View  Result Date: 11/10/2020 CLINICAL DATA:  Pericardial effusion status post drainage. EXAM: PORTABLE CHEST 1 VIEW COMPARISON:  CT chest from same day.  Chest x-ray from yesterday. FINDINGS: New pericardial drain. Normal heart size. Normal pulmonary vascularity. Unchanged right paratracheal and hilar fullness related to underlying lymphadenopathy. Emphysematous changes again noted. New patchy airspace disease at the right lung base. No pleural effusion or pneumothorax. IMPRESSION: 1. New pericardial drain. 2. New patchy airspace disease at the right lung base, suspicious for aspiration. 3. Unchanged mediastinal and right hilar lymphadenopathy. Electronically Signed   By: Titus Dubin M.D.   On: 11/10/2020 20:54   ECHOCARDIOGRAM COMPLETE  Result Date: 11/10/2020    ECHOCARDIOGRAM REPORT   Patient Name:   MART COLPITTS Date of Exam: 11/10/2020 Medical Rec #:  [id number]      Height:       71.0 in Accession #:    [id number]     Weight:       136.0 lb Date of Birth:  09-Apr-1961      BSA:          1.790 m Patient Age:    79 years       BP:           112/79 mmHg Patient Gender: M              HR:           110 bpm. Exam Location:  Inpatient Procedure: 2D Echo Indications:    pericardial effusion  History:        Patient has no prior history of Echocardiogram examinations.                 Signs/Symptoms:Shortness of Breath.  Sonographer:    Johny Chess Referring Phys: [id number] [place]  1. Left ventricular ejection fraction, by estimation, is 55 to 60%. The left ventricle has normal function. The left ventricle has no regional wall motion abnormalities. Indeterminate diastolic filling due to E-A fusion.  2.  Right ventricular systolic function is normal. The right ventricular size is normal. The estimated right ventricular systolic pressure is 09.8 mmHg.  3. Moderate pericardial effusion. The pericardial effusion is circumferential. Findings are consistent with cardiac  tamponade.  4. The mitral valve is normal in structure. No evidence of mitral valve regurgitation.  5. The aortic valve is tricuspid. Aortic valve regurgitation is not visualized. No aortic stenosis is present.  6. The inferior vena cava is dilated in size with <50% respiratory variability, suggesting right atrial pressure of 15 mmHg. FINDINGS  Left Ventricle: Left ventricular ejection fraction, by estimation, is 55 to 60%. The left ventricle has normal function. The left ventricle has no regional wall motion abnormalities. The left ventricular internal cavity size was normal in size. There is  no left ventricular hypertrophy. Indeterminate diastolic filling due to E-A fusion. Right Ventricle: The right ventricular size is normal. No increase in right ventricular wall thickness. Right ventricular systolic function is normal. The tricuspid regurgitant velocity is 1.57 m/s, and with an assumed right atrial pressure of 15 mmHg, the estimated right ventricular systolic pressure is 11.9 mmHg. Left Atrium: Left atrial size was normal in size. Right Atrium: Right atrial size was normal in size. Pericardium: A moderately sized pericardial effusion is present. The pericardial effusion is circumferential. There is excessive respiratory variation in septal movement, diastolic collapse of the right ventricular free wall, diastolic collapse of the right atrial wall, excessive respiratory variation in the mitral valve spectral Doppler velocities and excessive respiratory variation in the tricuspid valve spectral Doppler velocities. There is evidence of cardiac tamponade. Mitral Valve: The mitral valve is normal in structure. No evidence of mitral valve regurgitation. Tricuspid Valve: The tricuspid valve is normal in structure. Tricuspid valve regurgitation is not demonstrated. Aortic Valve: The aortic valve is tricuspid. Aortic valve regurgitation is not visualized. No aortic stenosis is present. Pulmonic Valve: The pulmonic  valve was normal in structure. Pulmonic valve regurgitation is not visualized. Aorta: The aortic root is normal in size and structure. Venous: The inferior vena cava is dilated in size with less than 50% respiratory variability, suggesting right atrial pressure of 15 mmHg. IAS/Shunts: No atrial level shunt detected by color flow Doppler.  LEFT VENTRICLE PLAX 2D LVIDd:         4.20 cm Diastology LVIDs:         2.15 cm LV e' medial:    7.83 cm/s LV PW:         1.00 cm LV E/e' medial:  4.5 LV IVS:        0.90 cm LV e' lateral:   7.51 cm/s                        LV E/e' lateral: 4.7  RIGHT VENTRICLE             IVC RV S prime:     26.80 cm/s  IVC diam: 2.70 cm TAPSE (M-mode): 2.3 cm LEFT ATRIUM             Index       RIGHT ATRIUM          Index LA diam:        2.20 cm 1.23 cm/m  RA Area:     7.14 cm LA Vol (A2C):   16.3 ml 9.11 ml/m  RA Volume:   12.30 ml 6.87 ml/m LA Vol (A4C):   18.3 ml 10.22 ml/m LA Biplane Vol: 19.1 ml 10.67 ml/m  AORTIC VALVE LVOT Vmax:   66.60 cm/s LVOT Vmean:  44.300 cm/s LVOT VTI:    0.089 m  AORTA Ao Asc diam: 3.30 cm MV E velocity: 35.10 cm/s  TRICUSPID VALVE MV A velocity: 57.80 cm/s  TR Peak grad:   9.9 mmHg MV E/A ratio:  0.61        TR Vmax:        157.46 cm/s                             SHUNTS                            Systemic VTI: 0.09 m Mihai Croitoru MD Electronically signed by Sanda Klein MD Signature Date/Time: 11/10/2020/6:07:36 PM    Final     Labs:  CBC: Recent Labs    11/12/20 0021 11/13/20 0828 11/14/20 0748 11/16/20 0106  WBC 10.3 9.4 7.9 8.7  HGB 10.3* 12.0* 11.8* 11.0*  HCT 29.3* 34.4* 33.8* 31.9*  PLT 240 266 290 344    COAGS: No results for input(s): INR, APTT in the last 8760 hours.  BMP: Recent Labs    11/14/20 0748 11/15/20 0125 11/16/20 0106 11/17/20 0110  NA 130* 131* 132* 132*  K 4.0 4.2 4.3 3.7  CL 99 98 98 97*  CO2 24 28 28 27   GLUCOSE 101* 110* 106* 113*  BUN 12 11 9 10   CALCIUM 8.7* 8.4* 8.7* 9.0  CREATININE 0.72 0.90  0.83 0.88  GFRNONAA >60 >60 >60 >60    LIVER FUNCTION TESTS: Recent Labs    11/09/20 2242 11/12/20 0021  BILITOT 0.8 0.6  AST 20 21  ALT 18 15  ALKPHOS 93 67  PROT 6.1* 5.0*  ALBUMIN 3.2* 2.6*     Assessment and Plan:  Lung mass, necrotic lymphadenopathy (including right supraclavicular mass), bone lesions, and pancreatic tail lesion in setting of 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 Plan for image-guided right supraclavicular mass FNA versus biopsy in IR tentatively for tomorrow 11/18/2020 pending IR scheduling. Patient will be NPO at midnight. Afebrile and WBCs WNL.  Risks and benefits discussed with the patient including, but not limited to bleeding, infection, damage to adjacent structures or low yield requiring additional tests. All of the patient's questions were answered, patient is agreeable to proceed. Consent signed and in IR control room.   Thank you for this interesting consult.  I greatly enjoyed meeting Levi Klaiber and look forward to participating in their care.  A copy of this report was sent to the requesting provider on this date.  Electronically Signed: Earley Abide, PA-C 11/17/2020, 4:06 PM   I spent a total of 20 Minutes in face to face in clinical consultation, greater than 50% of which was counseling/coordinating care for right supraclavicular mass/biopsy.

## 2020-11-17 NOTE — Progress Notes (Addendum)
PROGRESS NOTE    Brian Leonard  NTI:[id number] DOB: [birth year] DOA: 11/09/2020 PCP: Patient, No Pcp Per   Brief Narrative: 60 year old with past medical history significant for tobacco abuse, presents complaining of back pain, chest pain and cough.  Patient reports some chronic back pain but worse over the last month. He was also complaining of chest pain.  He report productive sputum.  He continues to smoke 7 cigarettes/day.  Evaluation in the ED patient was afebrile slight tachypneic, tachycardic heart rate in the 110s blood pressure stable.  CT chest concerning for extensive necrotic adenopathy, necrotic right lower lobe mass, numerous scattered micronodules throughout bilateral lungs, pericardial effusion, bone lesions involving T9 and bilateral ribs, suspected thrombus in the left brachiocephalic vein, partially visualized pancreatic tail lesion subone other finding.  2D echo showed pericardial effusion with signs of tamponade.  Patient was taken to the OR emergently on 3/8 for pericardial window and drainage.  Patient develops recurrence of A fib RVR 3/11. Received IV metoprolol. He was started on IV amiodarone. Sinus rhythm. -Pericardial drain remove 3/14. He will need repeat ECHO in 2 weeks post drain remove.    Assessment & Plan:   Principal Problem:   Pericardial effusion Active Problems:   Brachiocephalic vein thrombosis (HCC)   Multiple pulmonary nodules   Lymphadenopathy, thoracic   Right lower lobe lung mass   Thyroid nodule   Liver lesion, right lobe   Lesion of pancreas   Lesion of thoracic vertebra   Rib lesion   PAF (paroxysmal atrial fibrillation) (HCC)  1-Pericardial effusion, tamponade: -He was tachycardic, with low blood pressure.  2D echo confirmed pericardial effusion with tamponade. -CVTS was consulted, patient underwent pericardial window done 3/8, yielding bloody fluid. -Cytology Biopsy Metastatic carcinoma. Unclear primary.  -Continue with pericardial  drain, 100cc documented.  -Appreciate cardiology and cardiothoracic surgery. -Still requiring IV pain medications.  -Plan to remove pericardial drain today.   2-Lung mass, necrotic lymphadenopathy, bone lesions, pancreatic tail lesion. -Patient presents with history of increased cough, pleuritic pain, back pain found to have on CT chest extensive necrotic lymphadenopathy, necrotic right lower lobe mass, numerous bilateral nodules, lucent T9 lesion with soft tissue extension, left ninth rib and right 7 rib lesions, hyper attenuating focus in the right lobe of liver, partially visualized pancreatic tail lesion. -ConcernPrimary pulmonary malignancy with widespread metastasis, pancreatic primary or less likely atypical infection. -Cytology from pericardial fluid biopsy, malignant cell presents, stain pending.  -AFB ordered unable to be send, no cough and Quantiferon gold negative.  -Appreciate Dr Lindi Adie evaluation, suspicious for lung primary.  -Cytology, biopsy carcinoma, unknown primary. Discussed with Dr Lindi Adie, will ask pulmonary to evaluate for Biospy   3-Brachiocephalic thrombosis: [name] left brachiocephalic vein thrombus noted on CT -Was initially started on IV heparin.  Hold postop.  Plan to continue to hold anticoagulation postsurgery due to hemoptysis and bloody drainage from pericardial window.  -on Low dose Lovenox, prophylaxis.  awaiting call from Dr Cyndia Bent to discussed timing for full dose anticoagulation.  -Discussed with Dr Sabino Gasser, safe thing to do is to continue low dose anticoagulation for now, until patient get Follow up ECHO in 2 weeks and reassess.   4-Thyroid nodule: He will need ultrasound for follow-up.  5-Tobacco abuse: Motivated to quit, continue with nicotine patch 6-Mild hyponatremia: Monitor. Stable.  7-Hemoptysis; related to lung mass, was on anticoagulation. Holding heparin. Hb stable. - No further hemoptysis.  8-A fib RVR;  -Recurrence of A fib RVR, Received IV  metoprolol.  -Started  oral metoprolol. Started on Amiodarone Gtt. Transition to oral amiodarone. 9-BL pleural effusion;  Chest x ray with some improvement of pleural effusion.  Will repeat IV lasix and albumin. Repeat chest x ray tomorrow.    Estimated body mass index is 18.97 kg/m as calculated from the following:   Height as of this encounter: 5\' 11"  (1.803 m).   Weight as of this encounter: 61.7 kg.   DVT prophylaxis: Lovenox Code Status: Full code Family Communication: Care discussed with patient, sister [id number] Disposition Plan:  Status is: Inpatient  Remains inpatient appropriate because:Ongoing diagnostic testing needed not appropriate for outpatient work up   Dispo:  Patient From: Home  Planned Disposition: Home  Medically stable for discharge: No          Consultants:   Cardiothoracic surgery  Cardiology  Procedures:   Pericardial window 3/8  Antimicrobials:    Subjective: He report pain at site of surgery improved.  He had BM He denies worsening dyspnea.   Objective: Vitals:   11/17/20 0336 11/17/20 0736 11/17/20 0800 11/17/20 0910  BP: 101/80 100/71  (!) 115/94  Pulse: (!) 103 93 95 (!) 103  Resp: 14 15 16    Temp: 97.8 F (36.6 C) 98 F (36.7 C)    TempSrc: Oral Oral    SpO2: 94% 96% 92%   Weight:      Height:        Intake/Output Summary (Last 24 hours) at 11/17/2020 1422 Last data filed at 11/17/2020 1417 Gross per 24 hour  Intake 326.45 ml  Output 600 ml  Net -273.55 ml   Filed Weights   11/10/20 0317  Weight: 61.7 kg    Examination:  General exam: NAD Respiratory system: Crackles bases Cardiovascular system: S 1, S 2 RRR Gastrointestinal system: BS present, Soft, nt Central nervous system: Alert.  Extremities: No edema Data Reviewed: I have personally reviewed following labs and imaging studies  CBC: Recent Labs  Lab 11/11/20 0416 11/12/20 0021 11/13/20 0828 11/14/20 0748 11/16/20 0106  WBC 9.7 10.3 9.4 7.9 8.7   HGB 10.7* 10.3* 12.0* 11.8* 11.0*  HCT 29.8* 29.3* 34.4* 33.8* 31.9*  MCV 93.4 93.9 93.7 93.4 93.3  PLT 233 240 266 290 161   Basic Metabolic Panel: Recent Labs  Lab 11/12/20 0054 11/13/20 0828 11/14/20 0748 11/15/20 0125 11/16/20 0106 11/17/20 0110  NA  --  132* 130* 131* 132* 132*  K  --  3.9 4.0 4.2 4.3 3.7  CL  --  99 99 98 98 97*  CO2  --  24 24 28 28 27   GLUCOSE  --  101* 101* 110* 106* 113*  BUN  --  10 12 11 9 10   CREATININE  --  0.77 0.72 0.90 0.83 0.88  CALCIUM  --  8.6* 8.7* 8.4* 8.7* 9.0  MG 1.7 1.9  --   --   --   --    GFR: Estimated Creatinine Clearance: 79.9 mL/min (by C-G formula based on SCr of 0.88 mg/dL). Liver Function Tests: Recent Labs  Lab 11/12/20 0021  AST 21  ALT 15  ALKPHOS 67  BILITOT 0.6  PROT 5.0*  ALBUMIN 2.6*   No results for input(s): LIPASE, AMYLASE in the last 168 hours. No results for input(s): AMMONIA in the last 168 hours. Coagulation Profile: No results for input(s): INR, PROTIME in the last 168 hours. Cardiac Enzymes: No results for input(s): CKTOTAL, CKMB, CKMBINDEX, TROPONINI in the last 168 hours. BNP (last 3 results) No results for  input(s): PROBNP in the last 8760 hours. HbA1C: No results for input(s): HGBA1C in the last 72 hours. CBG: Recent Labs  Lab 11/16/20 1116 11/16/20 1601 11/16/20 2122 11/17/20 0635 11/17/20 1058  GLUCAP 109* 99 103* 120* 131*   Lipid Profile: No results for input(s): CHOL, HDL, LDLCALC, TRIG, CHOLHDL, LDLDIRECT in the last 72 hours. Thyroid Function Tests: No results for input(s): TSH, T4TOTAL, FREET4, T3FREE, THYROIDAB in the last 72 hours. Anemia Panel: No results for input(s): VITAMINB12, FOLATE, FERRITIN, TIBC, IRON, RETICCTPCT in the last 72 hours. Sepsis Labs: No results for input(s): PROCALCITON, LATICACIDVEN in the last 168 hours.  Recent Results (from the past 240 hour(s))  Resp Panel by RT-PCR (Flu A&B, Covid) Nasopharyngeal Swab     Status: None   Collection Time:  11/10/20  1:55 AM   Specimen: Nasopharyngeal Swab; Nasopharyngeal(NP) swabs in vial transport medium  Result Value Ref Range Status   SARS Coronavirus 2 by RT PCR NEGATIVE NEGATIVE Final    Comment: (NOTE) SARS-CoV-2 target nucleic acids are NOT DETECTED.  The SARS-CoV-2 RNA is generally detectable in upper respiratory specimens during the acute phase of infection. The lowest concentration of SARS-CoV-2 viral copies this assay can detect is 138 copies/mL. A negative result does not preclude SARS-Cov-2 infection and should not be used as the sole basis for treatment or other patient management decisions. A negative result may occur with  improper specimen collection/handling, submission of specimen other than nasopharyngeal swab, presence of viral mutation(s) within the areas targeted by this assay, and inadequate number of viral copies(<138 copies/mL). A negative result must be combined with clinical observations, patient history, and epidemiological information. The expected result is Negative.  Fact Sheet for Patients:  EntrepreneurPulse.com.au  Fact Sheet for Healthcare Providers:  IncredibleEmployment.be  This test is no t yet approved or cleared by the [place] FDA and  has been authorized for detection and/or diagnosis of SARS-CoV-2 by FDA under an Emergency Use Authorization (EUA). This EUA will remain  in effect (meaning this test can be used) for the duration of the COVID-19 declaration under Section 564(b)(1) of the Act, 21 U.S.C.section 360bbb-3(b)(1), unless the authorization is terminated  or revoked sooner.       Influenza A by PCR NEGATIVE NEGATIVE Final   Influenza B by PCR NEGATIVE NEGATIVE Final    Comment: (NOTE) The Xpert Xpress SARS-CoV-2/FLU/RSV plus assay is intended as an aid in the diagnosis of influenza from Nasopharyngeal swab specimens and should not be used as a sole basis for treatment. Nasal washings  and aspirates are unacceptable for Xpert Xpress SARS-CoV-2/FLU/RSV testing.  Fact Sheet for Patients: EntrepreneurPulse.com.au  Fact Sheet for Healthcare Providers: IncredibleEmployment.be  This test is not yet approved or cleared by the [place] FDA and has been authorized for detection and/or diagnosis of SARS-CoV-2 by FDA under an Emergency Use Authorization (EUA). This EUA will remain in effect (meaning this test can be used) for the duration of the COVID-19 declaration under Section 564(b)(1) of the Act, 21 U.S.C. section 360bbb-3(b)(1), unless the authorization is terminated or revoked.  Performed at [facility], [place] [address], [place], [place] [address]   MRSA PCR Screening     Status: None   Collection Time: 11/11/20  2:28 AM   Specimen: Nasal Mucosa; Nasopharyngeal  Result Value Ref Range Status   MRSA by PCR NEGATIVE NEGATIVE Final    Comment:        The GeneXpert MRSA Assay (FDA approved for NASAL specimens only),  is one component of a comprehensive MRSA colonization surveillance program. It is not intended to diagnose MRSA infection nor to guide or monitor treatment for MRSA infections. Performed at [facility], [place] [address], [place],  [address]          Radiology Studies: DG Chest 2 View  Result Date: 11/17/2020 CLINICAL DATA:  Pericardial drain removed. EXAM: CHEST - 2 VIEW COMPARISON:  11/16/2020 and multiple previous FINDINGS: Pericardial drain is been removed. Lead artifacts overlie the mediastinum and heart. Small left effusion with left lower lobe volume loss. This is slightly improved. Small to moderate size right effusion with right lower lung volume loss, also improved. Emphysema and scarring again noted at both lung apices. IMPRESSION: Interval removal of pericardial drain. Slight improvement in small left effusion and left lower lobe volume loss. Small to moderate right effusion with  right lower lung volume loss, also slightly improved. Electronically Signed   By: Nelson Chimes M.D.   On: 11/17/2020 08:47   DG Chest Port 1 View  Result Date: 11/16/2020 CLINICAL DATA:  Pericardial effusion.  Chest pain EXAM: PORTABLE CHEST 1 VIEW COMPARISON:  November 14, 2020 chest radiograph and chest CT FINDINGS: Pericardial drain in place. The heart size and pulmonary vascularity appear within normal limits. There are pleural effusions bilaterally with bibasilar atelectasis. Fullness in the right paratracheal region is stable, consistent with adenopathy noted on recent CT. There is a degree of right hilar and azygos region adenopathy as well. No bone lesions. Postoperative change lower cervical region. IMPRESSION: Pericardial drain in place.  Heart size normal. Pleural effusions bilaterally with bibasilar atelectasis. Areas of adenopathy in the right paratracheal, azygos, and right hilar regions seen by radiography. Adenopathy in other areas better seen on CT. Electronically Signed   By: Lowella Grip III M.D.   On: 11/16/2020 07:59        Scheduled Meds: . amiodarone  200 mg Oral Q12H   Followed by  . [START ON 12-18-20] amiodarone  200 mg Oral Daily  . Chlorhexidine Gluconate Cloth  6 each Topical Daily  . enoxaparin (LOVENOX) injection  40 mg Subcutaneous Daily  . insulin aspart  0-9 Units Subcutaneous TID WC  . mouth rinse  15 mL Mouth Rinse BID  . metoprolol tartrate  25 mg Oral BID  . nicotine  14 mg Transdermal Daily  . pantoprazole  40 mg Oral BID  . polyethylene glycol  17 g Oral Daily  . senna-docusate  1 tablet Oral BID   Continuous Infusions: . sodium chloride       LOS: 7 days    Time spent: 35 minutes.     Elmarie Shiley, MD [facility]   If 7PM-7AM, please contact night-coverage [email or web address]  11/17/2020, 2:22 PM

## 2020-11-17 NOTE — Progress Notes (Signed)
Per Dr. Lindi Adie. I contacted pathology to see if there is enough tissue to send molecular and PDL 1 testing on recent bx.  If so they will send to [facility].

## 2020-11-18 ENCOUNTER — Inpatient Hospital Stay (HOSPITAL_COMMUNITY): Payer: Medicaid Other

## 2020-11-18 LAB — CBC
HCT: 31.7 % — ABNORMAL LOW (ref 39.0–52.0)
Hemoglobin: 11.1 g/dL — ABNORMAL LOW (ref 13.0–17.0)
MCH: 33 pg (ref 26.0–34.0)
MCHC: 35 g/dL (ref 30.0–36.0)
MCV: 94.3 fL (ref 80.0–100.0)
Platelets: 371 10*3/uL (ref 150–400)
RBC: 3.36 MIL/uL — ABNORMAL LOW (ref 4.22–5.81)
RDW: 13.2 % (ref 11.5–15.5)
WBC: 9.9 10*3/uL (ref 4.0–10.5)
nRBC: 0 % (ref 0.0–0.2)

## 2020-11-18 LAB — BASIC METABOLIC PANEL
Anion gap: 9 (ref 5–15)
BUN: 11 mg/dL (ref 6–20)
CO2: 28 mmol/L (ref 22–32)
Calcium: 9.3 mg/dL (ref 8.9–10.3)
Chloride: 95 mmol/L — ABNORMAL LOW (ref 98–111)
Creatinine, Ser: 0.89 mg/dL (ref 0.61–1.24)
GFR, Estimated: >60 mL/min (ref >60)
Glucose, Bld: 108 mg/dL — ABNORMAL HIGH (ref 70–99)
Potassium: 4.3 mmol/L (ref 3.5–5.1)
Sodium: 132 mmol/L — ABNORMAL LOW (ref 135–145)

## 2020-11-18 LAB — GLUCOSE, CAPILLARY
Glucose-Capillary: 135 mg/dL — ABNORMAL HIGH (ref 70–99)
Glucose-Capillary: 109 mg/dL — ABNORMAL HIGH (ref 70–99)
Glucose-Capillary: 117 mg/dL — ABNORMAL HIGH (ref 70–99)
Glucose-Capillary: 122 mg/dL — ABNORMAL HIGH (ref 70–99)

## 2020-11-18 LAB — MAGNESIUM: Magnesium: 1.9 mg/dL (ref 1.7–2.4)

## 2020-11-18 MED ORDER — MORPHINE SULFATE (PF) 4 MG/ML IV SOLN
4.0000 mg | Freq: Once | INTRAVENOUS | Status: AC
Start: 2020-11-18 — End: 2020-11-18
  Administered 2020-11-18: 4 mg via INTRAVENOUS
  Filled 2020-11-18: qty 1

## 2020-11-18 MED ORDER — MORPHINE SULFATE (CONCENTRATE) 10 MG/0.5ML PO SOLN
5.0000 mg | ORAL | Status: DC | PRN
  Administered 2020-11-18 (×3): 5 mg via ORAL
  Filled 2020-11-18 (×4): qty 0.5

## 2020-11-18 MED ORDER — LIDOCAINE 5 % EX PTCH
1.0000 | MEDICATED_PATCH | Freq: Every day | CUTANEOUS | Status: DC
  Administered 2020-11-18: 1 via TRANSDERMAL
  Filled 2020-11-18: qty 1

## 2020-11-18 MED ORDER — DOCUSATE SODIUM 283 MG RE ENEM
1.0000 | ENEMA | Freq: Once | RECTAL | Status: AC
  Administered 2020-11-18: 283 mg via RECTAL
  Filled 2020-11-18: qty 1

## 2020-11-18 NOTE — Progress Notes (Signed)
PT Cancellation Note  Patient Details Name: Brian Leonard MRN: [id number] DOB: 1961/08/08   Cancelled Treatment:      Pt reports did not sleep well last night and would like to rest.  He did work some with OT earlier.  Will f/u at later time as able. Abran Richard, PT Acute Rehab Services Pager [phone] [facility] [place] 11/18/2020, 11:45 AM

## 2020-11-18 NOTE — Hospital Course (Addendum)
5  Remains on 3 L   Problem list

## 2020-11-18 NOTE — Progress Notes (Signed)
Occupational Therapy Evaluation Patient Details Name: Brian Leonard MRN: [id number] DOB: 12/15/1960 Today's Date: 11/18/2020    History of Present Illness 60 y.o. male with pericardial tamponade, widespread metastasis (bone lesions involving T9 and bilateral ribs; lung is primary), s/p pericardial window 11/10/2020; pericardial drain removed 3/14.  Left UE thrombus and afib with RVR. PMH: tobacco abuse, asthma.   Clinical Impression   Session limited by fatigue/states he did not sleep last night. PTA pt independent/lives with family and states they will be able to assist at DC.  Pt able to complete ADL tasks and is mobilizing to the bathroom @ modified independent level. Fatigues easily and will benefit from education on energy conservation, reducing risk of falls and establishing a HEP to maintain strength for ADL tasks. Will follow acutely to maximize functional level of independence to facilitate safe DC home.     Follow Up Recommendations  No OT follow up;Supervision - Intermittent    Equipment Recommendations  Tub/shower seat    Recommendations for Other Services       Precautions / Restrictions Precautions Precautions: Fall      Mobility Bed Mobility Overal bed mobility: Modified Independent             General bed mobility comments: use of rails    Transfers Overall transfer level: Modified independent                    Balance                                           ADL either performed or assessed with clinical judgement   ADL Overall ADL's : Needs assistance/impaired                                     Functional mobility during ADLs: Modified independent General ADL Comments: able to complete basic ADL tasks however fatigues adn becomes SOB. Limited participation. Began education on energy conservation strategies. Recommend use of shower chair.     Vision         Perception     Praxis      Pertinent  Vitals/Pain Pain Assessment: No/denies pain (however session was limited because he did not want to be in pain)     Hand Dominance Right   Extremity/Trunk Assessment Upper Extremity Assessment Upper Extremity Assessment: Generalized weakness   Lower Extremity Assessment Lower Extremity Assessment: Defer to PT evaluation   Cervical / Trunk Assessment Cervical / Trunk Assessment: Normal   Communication Communication Communication: No difficulties   Cognition Arousal/Alertness: Lethargic Behavior During Therapy: Flat affect Overall Cognitive Status: No family/caregiver present to determine baseline cognitive functioning                                 General Comments: most likely baseline   General Comments   On 2L - SpO2 92-93 at rest. Will further assess with increased activity.    Exercises     Shoulder Instructions      Home Living Family/patient expects to be discharged to:: Private residence Living Arrangements: Spouse/significant other Available Help at Discharge: Family;Other (Comment);Available 24 hours/day Type of Home: House Home Access: Stairs to enter [employer] of Steps: 4 Entrance Stairs-Rails: Right;Left Home  Layout: One level     Bathroom Shower/Tub: Therapist, art: Yes How Accessible: Accessible via walker Home Equipment: None          Prior Functioning/Environment Level of Independence: Independent        Comments: does not work;drives        OT Problem List: Decreased activity tolerance;Decreased knowledge of use of DME or AE;Cardiopulmonary status limiting activity;Pain;Decreased strength      OT Treatment/Interventions: Self-care/ADL training;Energy conservation;DME and/or AE instruction;Therapeutic activities;Patient/family education    OT Goals(Current goals can be found in the care plan section) Acute Rehab OT Goals Patient Stated Goal: to  go home OT Goal Formulation: With patient Time For Goal Achievement: 12/02/20 Potential to Achieve Goals: Good  OT Frequency: Min 2X/week   Barriers to D/C:            Co-evaluation              AM-PAC OT "6 Clicks" Daily Activity     Outcome Measure Help from another person eating meals?: None Help from another person taking care of personal grooming?: None Help from another person toileting, which includes using toliet, bedpan, or urinal?: None Help from another person bathing (including washing, rinsing, drying)?: None Help from another person to put on and taking off regular upper body clothing?: None Help from another person to put on and taking off regular lower body clothing?: None 6 Click Score: 24   End of Session Equipment Utilized During Treatment: Oxygen (2L) Nurse Communication: Mobility status  Activity Tolerance: Patient limited by fatigue;Patient limited by lethargy Patient left: in bed;with call bell/phone within reach  OT Visit Diagnosis: Muscle weakness (generalized) (M62.81);Pain Pain - part of body:  (generalized)                Time: 6606-0045 OT Time Calculation (min): 15 min Charges:  OT General Charges $OT Visit: 1 Visit OT Evaluation $OT Eval Moderate Complexity: [place], OT/L   Acute OT Clinical Specialist Acute Rehabilitation Services Pager [phone] Office [phone]   [facility] 11/18/2020, 9:48 AM

## 2020-11-18 NOTE — Progress Notes (Signed)
PT Cancellation Note  Patient Details Name: Brian Leonard MRN: [id number] DOB: 1961-01-10   Cancelled Treatment:       Checked in morning and pt reports fatigued from not sleeping.  Checked in afternoon and pt had just received morphine and stated "not today."  Educated that medical team wants to see how HR and O2 respond to activity for possible d/c tomorrow - pt reports not up for it now.   Will f/u at later date.  RN aware and reports  Pt has had a tough day and she will check with him later.  Abran Richard, PT Acute Rehab Services Pager [phone] [facility] [place] 11/18/2020, 4:45 PM

## 2020-11-18 NOTE — Progress Notes (Signed)
PROGRESS NOTE   Brian Leonard  ZHG:[id number] DOB: February 06, 1961 DOA: 11/09/2020 PCP: Patient, No Pcp Per  Brief Narrative:  60-year-old community dwelling male Tobacco smoker, prior incarceration-never homeless Admit 11/11/2019 cough pleurisy lumbago- CT chest = extensive necrotic adenopathy + necrotic LL lobe mass- Also pericardial effusion-+ tamponade physiology-underwent window CVTS + drainage 3/8 Developed persistent A. fib RVR 3/11 Rx amiodarone Pericardial drain DC 3/14  Pathology confirmed lung = primary lesion for metastatic disease   Hospital-Problem based course  Malignant pericardial tamponade Status post window 3/8 Primary lung cancer + left ninth rib right rib lesions Will need outpatient follow-up with Dr. Lindi Adie Patient seems to have given up-understands end-stage cancer and that he has stage IV and has follow-up appointment 3/18 with Dr. Lindi Adie I have started Roxanol 5 mg every 3 as needed for severe pain and he can continue tramadol for milder pain Persistent A. fib RVR CHADS2 score >2 on amiodarone [new onset this admit] Continue amiodarone load until 3/19 and then cut back dose to 200 daily from the 19th Continue metoprolol 25 twice daily Check a.m. 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 Caffie Pinto Thyroid nodule Outpatient characterization if the patient requires or desires full CODE STATUS Hyponatremia likely of malignancy Periodic rechecks  Pleural effusions Aim to keep volume dry    DVT prophylaxis: SCD currently Code Status: Full CODE STATUS Family Communication:  15-minute discussion at bedside with [name] [phone] Discussed likely discharge a.m. 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 Lindi Adie who I will CC today based on our discussions  Procedures: None  Antimicrobials: None   Subjective: Patient tearful upset and emotional about cancer diagnosis-"I can feel the cancer in my bones" Relates 10/10 pain with laying flat No hemoptysis no chest pain no dark stool no tarry stool no nausea no vomiting  Objective: Vitals:   11/18/20 0514 11/18/20 0521 11/18/20 0812 11/18/20 1107  BP: 130/83  115/80 (!) 121/92  Pulse: (!) 104 100 92 97  Resp: 11 17 10 13   Temp: 97.9 F (36.6 C)  97.8 F (36.6 C) 98.6 F (37 C)  TempSrc: Oral  Oral Oral  SpO2: 95%  92% 96%  Weight:      Height:        Intake/Output Summary (Last 24 hours) at 11/18/2020 1353 Last data filed at 11/18/2020 9211 Gross per 24 hour  Intake --  Output 375 ml  Net -375 ml   Filed Weights   11/10/20 0317  Weight: 61.7 kg    Examination:  Alert cachectic no distress mild JVD.male Poor dentition Supraclavicular bitemporal wasting S1-S2 sinus rhythm sinus tach on monitors alternating with  A. fib chest clear no rales no rhonchi no submandibular lymphadenopathy Cannot appreciate nodule over rib on left side Abdomen soft no rebound No lower extremity edema No rash Psych very depressed  Data Reviewed: personally reviewed    Data Sodium 132 BUNs/creatinine 11/0.8 Hemoglobin 11.1, WBC 9 CXR 3/15 = removal of drain-improved left pleural effusion left lower lobe volume loss  CBC    Component Value Date/Time   WBC 9.9 11/18/2020 0023   RBC 3.36 (L) 11/18/2020 0023   HGB 11.1 (L) 11/18/2020 0023   HCT 31.7 (L) 11/18/2020 0023   PLT 371 11/18/2020 0023   MCV 94.3 11/18/2020 0023   MCH 33.0 11/18/2020 0023   MCHC 35.0 11/18/2020 0023   RDW 13.2 11/18/2020 0023   LYMPHSABS 1.1 11/09/2020 2242   MONOABS 0.9 11/09/2020 2242   EOSABS 0.2 11/09/2020 2242   BASOSABS 0.0 11/09/2020 2242   CMP Latest Ref Rng & Units 11/18/2020 11/17/2020 11/16/2020  Glucose 70 - 99 mg/dL 108(H) 113(H) 106(H)  BUN 6 - 20 mg/dL 11 10 9   Creatinine 0.61 - 1.24 mg/dL 0.89 0.88 0.83  Sodium 135 - 145 mmol/L 132(L) 132(L) 132(L)  Potassium 3.5 - 5.1 mmol/L 4.3 3.7 4.3  Chloride 98 - 111 mmol/L 95(L) 97(L) 98  CO2 22 - 32 mmol/L 28 27 28   Calcium 8.9 - 10.3 mg/dL 9.3 9.0 8.7(L)  Total Protein 6.5 - 8.1 g/dL - - -  Total Bilirubin 0.3 - 1.2 mg/dL - - -  Alkaline Phos 38 - 126 U/L - - -  AST 15 - 41 U/L - - -  ALT 0 - 44 U/L - - -     Radiology Studies: DG Chest 2 View  Result Date: 11/18/2020 CLINICAL DATA:  Cough and shortness of breath. EXAM: CHEST - 2 VIEW COMPARISON:  Chest x-ray from yesterday. FINDINGS: Normal heart size. Unchanged right hilar and upper mediastinal lymphadenopathy. The lungs remain hyperinflated with emphysematous changes. Unchanged small right greater than left pleural effusions with bibasilar atelectasis. IMPRESSION: 1. Unchanged small right greater than left pleural effusions with bibasilar atelectasis. 2. Unchanged thoracic lymphadenopathy. 3. COPD. Electronically Signed   By: Titus Dubin M.D.   On: 11/18/2020 10:59   DG Chest 2 View  Result Date: 11/17/2020 CLINICAL DATA:  Pericardial drain removed. EXAM: CHEST - 2 VIEW COMPARISON:  11/16/2020 and multiple previous FINDINGS: Pericardial drain is  been removed. Lead artifacts overlie the mediastinum and heart. Small left effusion with left lower lobe volume loss. This is slightly improved. Small to moderate size right effusion with right lower lung volume loss, also improved. Emphysema and scarring again noted at both lung apices. IMPRESSION: Interval removal of pericardial drain. Slight improvement in small left effusion and left lower lobe volume loss. Small to moderate right effusion with right lower lung volume loss, also slightly improved. Electronically Signed   By: Nelson Chimes M.D.   On: 11/17/2020 08:47     Scheduled Meds: . amiodarone  200 mg Oral Q12H   Followed by  . [START ON Dec 19, 2020] amiodarone  200 mg Oral Daily  . [START ON 11/19/2020] enoxaparin (LOVENOX) injection  40 mg Subcutaneous Q24H  . feeding supplement  1 Container Oral TID BM  . insulin aspart  0-9 Units Subcutaneous TID WC  . mouth rinse  15 mL Mouth Rinse BID  . metoprolol tartrate  25 mg Oral BID  . nicotine  14 mg Transdermal Daily  . pantoprazole  40 mg Oral BID  . polyethylene  glycol  17 g Oral Daily  . senna-docusate  1 tablet Oral BID   Continuous Infusions: . sodium chloride       LOS: 8 days   Time spent: 45 minutes  Nita Sells, MD [facility] To contact the attending provider between 7A-7P or the covering provider during after hours 7P-7A, please log into the web site [email or web address] and access using universal [place] password for that web site. If you do not have the password, please call the hospital operator.  11/18/2020, 1:53 PM

## 2020-11-18 NOTE — Progress Notes (Signed)
Pt request chaplain services.  Chaplain called and will come speak with patient.

## 2020-11-18 NOTE — Progress Notes (Signed)
Chaplain responded to page.  Pt requested visit from Chaplain for support as he deals with a a diagnosis of cancer. Pt spoke in very low tones expressing he is not afraid to die saying he is ready to die when the time comes.  Pt expressed not wanting to live in pain and not really having a life worth living any more. Chaplain assessed pt most wanted confirmation that if he decides to not fight this disease any longer he is not being selfish.Chaplain supported pt, soothed his fears that a decision to not fight is not being selfish.    Pt's wife was resting in recliner at bedside.  Chaplain spoke with her briefly.    [place]

## 2020-11-18 NOTE — Progress Notes (Signed)
Patient with afib RVR noted on the monitor rate 150-160s sustaining. PRN dose of metoprolol given, MD notified, HR now NSR 104

## 2020-11-18 NOTE — TOC Progression Note (Addendum)
Transition of Care [facility]) - Progression Note    Patient Details  Name: Brian Leonard MRN: [id number] Date of Birth: 12-Jul-1961  Transition of [facility]) CM/SW Contact  Zenon Mayo, RN Phone Number: 11/18/2020, 9:02 AM  Clinical Narrative:    NCM spoke with patient, per pt eval rec HHPT, NCM informed patient that it will be for charity and [facility] is the charity agency this month and they will have to call him to ask some financial information, he states he understands.  He has follow up apt at the [facility] clinic on  4/20 at 2:30.  Also.  He will need ast with medications at discharge.  3/16- [place] with [place] , states she call the patient, he told her he does not want anyone coming out to his home and he does not want to give out his financial information and he does not want HHPT.          Expected Discharge Plan and Services                                                 Social Determinants of Health (SDOH) Interventions    Readmission Risk Interventions No flowsheet data found.

## 2020-11-18 NOTE — Plan of Care (Signed)

## 2020-11-19 LAB — COMPREHENSIVE METABOLIC PANEL
ALT: 17 U/L (ref 0–44)
AST: 18 U/L (ref 15–41)
Albumin: 2.9 g/dL — ABNORMAL LOW (ref 3.5–5.0)
Alkaline Phosphatase: 105 U/L (ref 38–126)
Anion gap: 11 (ref 5–15)
BUN: 12 mg/dL (ref 6–20)
CO2: 25 mmol/L (ref 22–32)
Calcium: 9.7 mg/dL (ref 8.9–10.3)
Chloride: 96 mmol/L — ABNORMAL LOW (ref 98–111)
Creatinine, Ser: 0.87 mg/dL (ref 0.61–1.24)
GFR, Estimated: >60 mL/min (ref >60)
Glucose, Bld: 124 mg/dL — ABNORMAL HIGH (ref 70–99)
Potassium: 4.4 mmol/L (ref 3.5–5.1)
Sodium: 132 mmol/L — ABNORMAL LOW (ref 135–145)
Total Bilirubin: 0.9 mg/dL (ref 0.3–1.2)
Total Protein: 5.8 g/dL — ABNORMAL LOW (ref 6.5–8.1)

## 2020-11-19 LAB — CBC WITH DIFFERENTIAL/PLATELET
Abs Immature Granulocytes: 0.04 10*3/uL (ref 0.00–0.07)
Basophils Absolute: 0 10*3/uL (ref 0.0–0.1)
Basophils Relative: 0 %
Eosinophils Absolute: 0.1 10*3/uL (ref 0.0–0.5)
Eosinophils Relative: 1 %
HCT: 31.8 % — ABNORMAL LOW (ref 39.0–52.0)
Hemoglobin: 11.3 g/dL — ABNORMAL LOW (ref 13.0–17.0)
Immature Granulocytes: 0 %
Lymphocytes Relative: 7 %
Lymphs Abs: 0.9 10*3/uL (ref 0.7–4.0)
MCH: 32.9 pg (ref 26.0–34.0)
MCHC: 35.5 g/dL (ref 30.0–36.0)
MCV: 92.7 fL (ref 80.0–100.0)
Monocytes Absolute: 1 10*3/uL (ref 0.1–1.0)
Monocytes Relative: 8 %
Neutro Abs: 10.7 10*3/uL — ABNORMAL HIGH (ref 1.7–7.7)
Neutrophils Relative %: 84 %
Platelets: 341 10*3/uL (ref 150–400)
RBC: 3.43 MIL/uL — ABNORMAL LOW (ref 4.22–5.81)
RDW: 13 % (ref 11.5–15.5)
WBC: 12.7 10*3/uL — ABNORMAL HIGH (ref 4.0–10.5)
nRBC: 0 % (ref 0.0–0.2)

## 2020-11-19 LAB — MAGNESIUM: Magnesium: 1.8 mg/dL (ref 1.7–2.4)

## 2020-11-19 LAB — GLUCOSE, CAPILLARY: Glucose-Capillary: 122 mg/dL — ABNORMAL HIGH (ref 70–99)

## 2020-11-19 MED ORDER — TRAMADOL HCL 50 MG PO TABS: 50.0000 mg | ORAL_TABLET | Freq: Four times a day (QID) | ORAL | 0 refills | Status: DC | PRN

## 2020-11-19 MED ORDER — MAGNESIUM OXIDE -MG SUPPLEMENT 400 (240 MG) MG PO TABS: 1.0000 | ORAL_TABLET | Freq: Two times a day (BID) | ORAL | 0 refills | Status: AC

## 2020-11-19 MED ORDER — HYDROMORPHONE HCL 1 MG/ML PO LIQD
1.0000 mg | ORAL | Status: DC | PRN
  Administered 2020-11-19: 1 mg via ORAL
  Filled 2020-11-19: qty 1

## 2020-11-19 MED ORDER — AMIODARONE HCL 200 MG PO TABS: 200.0000 mg | ORAL_TABLET | Freq: Two times a day (BID) | ORAL | 0 refills | Status: AC

## 2020-11-19 MED ORDER — SIMETHICONE 80 MG PO CHEW
80.0000 mg | CHEWABLE_TABLET | Freq: Once | ORAL | Status: AC
  Administered 2020-11-19: 80 mg via ORAL
  Filled 2020-11-19: qty 1

## 2020-11-19 MED ORDER — NICOTINE 14 MG/24HR TD PT24: 14.0000 mg | MEDICATED_PATCH | Freq: Every day | TRANSDERMAL | 0 refills | Status: AC

## 2020-11-19 MED ORDER — OXYCODONE HCL ER 15 MG PO T12A
15.0000 mg | EXTENDED_RELEASE_TABLET | Freq: Two times a day (BID) | ORAL | 0 refills | Status: DC
Start: 2020-11-19 — End: 2020-11-19

## 2020-11-19 MED ORDER — OXYCODONE HCL ER 15 MG PO T12A
15.0000 mg | EXTENDED_RELEASE_TABLET | Freq: Two times a day (BID) | ORAL | Status: DC
Start: 2020-11-19 — End: 2020-11-19
  Administered 2020-11-19: 15 mg via ORAL
  Filled 2020-11-19: qty 1

## 2020-11-19 MED ORDER — LEVALBUTEROL TARTRATE 45 MCG/ACT IN AERO: 2.0000 | INHALATION_SPRAY | RESPIRATORY_TRACT | 12 refills | Status: AC | PRN

## 2020-11-19 MED ORDER — TRAMADOL HCL 50 MG PO TABS: 50.0000 mg | ORAL_TABLET | Freq: Four times a day (QID) | ORAL | 0 refills | Status: AC | PRN

## 2020-11-19 MED ORDER — OXYCODONE HCL ER 15 MG PO T12A: 15.0000 mg | EXTENDED_RELEASE_TABLET | Freq: Two times a day (BID) | ORAL | 0 refills | Status: AC

## 2020-11-19 MED ORDER — OXYCODONE HCL ER 15 MG PO T12A: 15.0000 mg | EXTENDED_RELEASE_TABLET | Freq: Two times a day (BID) | ORAL | 0 refills | Status: DC

## 2020-11-19 MED ORDER — PANTOPRAZOLE SODIUM 40 MG PO TBEC: 40.0000 mg | DELAYED_RELEASE_TABLET | Freq: Two times a day (BID) | ORAL | 0 refills | Status: AC

## 2020-11-19 MED ORDER — POLYETHYLENE GLYCOL 3350 17 G PO PACK: 17.0000 g | PACK | Freq: Every day | ORAL | 0 refills | Status: AC

## 2020-11-19 MED ORDER — AMIODARONE HCL 200 MG PO TABS: 200.0000 mg | ORAL_TABLET | Freq: Every day | ORAL | 0 refills | Status: AC

## 2020-11-19 MED ORDER — LIDOCAINE 0.5 % EX GEL: 1.0000 | Freq: Three times a day (TID) | CUTANEOUS | 0 refills | Status: AC

## 2020-11-19 MED ORDER — DIPHENHYDRAMINE HCL 25 MG PO CAPS: 25.0000 mg | ORAL_CAPSULE | Freq: Every evening | ORAL | 0 refills | Status: AC | PRN

## 2020-11-19 MED ORDER — GUAIFENESIN 100 MG/5ML PO SOLN: 10.0000 mL | ORAL | 0 refills | Status: AC | PRN

## 2020-11-19 MED ORDER — HYDROMORPHONE HCL 1 MG/ML PO LIQD: 1.0000 mg | ORAL | 0 refills | Status: AC | PRN

## 2020-11-19 MED ORDER — SENNOSIDES-DOCUSATE SODIUM 8.6-50 MG PO TABS: 1.0000 | ORAL_TABLET | Freq: Two times a day (BID) | ORAL | 0 refills | Status: AC

## 2020-11-19 MED ORDER — METOPROLOL TARTRATE 25 MG PO TABS: 25.0000 mg | ORAL_TABLET | Freq: Two times a day (BID) | ORAL | 1 refills | Status: AC

## 2020-11-19 MED ORDER — LORAZEPAM 2 MG/ML IJ SOLN
1.0000 mg | Freq: Once | INTRAMUSCULAR | Status: AC
  Administered 2020-11-19: 1 mg via INTRAVENOUS
  Filled 2020-11-19: qty 1

## 2020-11-19 MED ORDER — HYDROMORPHONE HCL 1 MG/ML PO LIQD: 1.0000 mg | ORAL | 0 refills | Status: DC | PRN

## 2020-11-19 NOTE — Plan of Care (Signed)

## 2020-11-19 NOTE — Progress Notes (Signed)
PT Cancellation Note  Patient Details Name: Brian Leonard MRN: [id number] DOB: April 11, 1961   Cancelled Treatment:    Reason Eval/Treat Not Completed: Patient declined, no reason specified (pt refused x 2 initially stating need for pain medicine but after medication continued to refuse and denied performing ambulation for oxygen requirement for home. Spouse does state she has pulse ox and oxygen at home.)   [name] 11/19/2020, 10:05 AM  Bayard Males, PT Acute Rehabilitation Services Pager: [phone] Office: [phone]

## 2020-11-19 NOTE — Discharge Summary (Signed)
Physician Discharge Summary  Brian Leonard ZSW:[id number] DOB: 20-Jul-1961 DOA: 11/09/2020  PCP: Patient, No Pcp Per  Admit date: 11/09/2020 Discharge date: 11/19/2020  Time spent: 60 minutes minutes  Recommendations for Outpatient Follow-up:  1. Suggest goals of care to be led by Dr. Lindi Adie after discussion about chemo/XRT for stage IV lung cancer 2. Hard prescription given for tramadol Dilaudid OxyContin 3. No initiation this admission amiodarone and transition on 3/19 to lower dose of the same-May need to continue amiodarone at higher dose for a longer period of time?-May also need magnesium level to ensure that electrolytes are repleted 4. Patient declines home health as he has good support at home  Discharge Diagnoses:  MAIN problem for hospitalization   New onset A. fib CHA2DS2-VASc score >4 not on anticoagulation because of DVT diagnosis admission New diagnosis of stage IV lung cancer with necrotic appearing pneumonia on admission with sepsis physiology Cancer related cachexia Hypoxic respiratory failure secondary to above diagnoses  Please see below for itemized issues addressed in [facility]- refer to other progress notes for clarity if needed  Discharge Condition: Guarded  Diet recommendation: [place] Anger Weights   11/10/20 0317  Weight: 61.7 kg    History of present illness:  60 year old community dwelling male Tobacco smoker, prior incarceration-never homeless Admit 11/11/2019 cough pleurisy lumbago- CT chest = extensive necrotic adenopathy + necrotic LL lobe mass- Also pericardial effusion-+ tamponade physiology-underwent window CVTS + drainage 3/8 Developed persistent A. fib RVR 3/11 Rx amiodarone Pericardial drain DC 3/14  Pathology confirmed lung = primary lesion for metastatic disease  Hospital Course:  Malignant pericardial tamponade Status post window 3/8 Primary lung cancer + left ninth rib right rib lesions Will need outpatient follow-up with Dr.  Lindi Adie Patient seems to have given up-understands end-stage cancer and that he has stage IV and has follow-up appointment 3/18 with Dr. Lindi Adie Patient was given hard prescription of tramadol, Dilaudid for very severe pain and OxyContin twice daily until he sees Dr. Lindi Adie tomorrow They are hopeful for interventions but realistic regarding overall prognosis Persistent A. 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 Caffie Pinto Thyroid nodule Outpatient characterization if the patient requires or desires full CODE STATUS Hyponatremia likely of malignancy Periodic rechecks Pleural effusions Aim to keep volume dry  Procedures: Multiple Consultations:  Cardiology  Oncology   Discharge Exam: Vitals:   11/19/20 0345 11/19/20 0834  BP: 93/73 112/87  Pulse: (!) 108 (!) 107  Resp: 18   Temp: 98.1 F (36.7 C)   SpO2: 90%     Subj on day of d/c   Patient passed up overnight had pain relieved mainly by tramadol-states morphine did not work Me and his wife Brian Leonard) discussed Dilaudid initiation for severe breakthrough over and above the tramadol and OxyContin twice daily He seems in a better place today he is not as morose or despondent however his wife pulled me aside and  we discussed that they are just waiting for all options although they understand and are  realistic He is ready to discharge And is asking if he can go home  General Exam on discharge  Cachectic frail.male No distress on oxygen 3 L No icterus no pallor by temporalis and supraclavicular wasting S1-S2 tachycardic Abdomen soft no rebound Air entry lateral lung fields benign Neurologically intact  Discharge Instructions   Discharge Instructions    Diet - low sodium heart healthy   Complete by: As directed    Discharge instructions   Complete by: As directed    Please follow with Dr. Lindi Adie tomorrow--I will cc him   Increase activity slowly   Complete by: As directed    Increase activity slowly   Complete by: As directed    No wound care   Complete by: As directed    No wound care   Complete by: As directed      Allergies as of 11/19/2020      Reactions   Iodine Anaphylaxis   Shellfish Allergy Anaphylaxis   Other Hives, Swelling   Reaction to unknown oral asthma medication (pt has taken Theo Dur in the past - 10 yrs ago with no reaction)      Medication List    STOP taking these medications   acetaminophen 500 MG tablet Commonly known as: TYLENOL   ibuprofen 200 MG tablet Commonly known as: ADVIL     TAKE these medications   amiodarone 200 MG tablet Commonly known as: PACERONE Take 1 tablet (200 mg total) by mouth every 12 (twelve) hours for 2 days.   amiodarone 200 MG tablet Commonly known as: PACERONE Take 1 tablet (200 mg total) by mouth daily. Start taking on: 2020-12-10   diphenhydrAMINE 25 mg capsule Commonly known as: BENADRYL Take 1 capsule (25 mg total) by mouth at bedtime as needed for sleep.   guaiFENesin 100 MG/5ML Soln Commonly known as: ROBITUSSIN Take 10 mLs (200 mg total) by mouth every 4 (four) hours as needed for cough or to loosen phlegm.   HYDROmorphone HCl 1 MG/ML Liqd Commonly known as: DILAUDID Take 1 mL (1 mg total) by mouth every 3 (three) hours as needed for severe pain.   levalbuterol 45 MCG/ACT  inhaler Commonly known as: XOPENEX HFA Inhale 2 puffs into the lungs every 2 (two) hours as needed for wheezing.   Lidocaine 0.5 % Gel Apply 1 each topically 3 (three) times daily.   metoprolol tartrate 25 MG tablet Commonly known as: LOPRESSOR Take 1 tablet (25 mg total) by mouth 2 (two) times daily.   multivitamin with minerals Tabs tablet Take 1 tablet by mouth daily.   nicotine 14 mg/24hr patch Commonly known as: NICODERM CQ - dosed in mg/24 hours Place 1 patch (14 mg total) onto the skin daily.   oxyCODONE 15 mg 12 hr tablet Commonly known as: OXYCONTIN Take 1 tablet (15 mg total) by mouth every 12 (twelve) hours.   pantoprazole 40 MG tablet Commonly known as: PROTONIX Take 1 tablet (40 mg total) by mouth 2 (two) times daily.   polyethylene glycol 17 g packet Commonly known as: MIRALAX / GLYCOLAX Take 17 g by mouth daily.   senna-docusate 8.6-50 MG tablet Commonly known as: Senokot-S Take 1 tablet by mouth 2 (two) times daily.   traMADol 50 MG tablet Commonly known as: ULTRAM Take 1-2 tablets (50-100 mg total) by mouth every 6 (six) hours as needed (mild pain).  Durable Medical Equipment  (From admission, onward)         Start     Ordered   11/19/20 0908  DME Oxygen  Once       Question Answer Comment  Length of Need Lifetime   Mode or (Route) Nasal cannula   Liters per Minute 3   Frequency Continuous (stationary and portable oxygen unit needed)   Oxygen conserving device No   Oxygen delivery system Gas      11/19/20 0907         Allergies  Allergen Reactions  . Iodine Anaphylaxis  . Shellfish Allergy Anaphylaxis  . Other Hives and Swelling    Reaction to unknown oral asthma medication (pt has taken Sherlyn Lick in the past - 10 yrs ago with no reaction)    Follow-up Information    Melissa Montane, MD. Call in 1 day.   Specialty: Otolaryngology Why: To schedule an appointment for evaluation of your oral lesion Contact information: [place] [address] [phone]        Gaye Pollack, MD. Go on 11/25/2020.   Specialty: Cardiothoracic Surgery Why: Appointment time is at 11:30 am Contact information: [address] [place] [address] [place] [facility] Follow up.   Specialty: Cardiology Why: Appointment for repeat Echocardiogram scheduled for 12/09/2020 at 7:30am at our [facility] office.  Contact information: [address], [place] [phone]       Sanda Klein, MD Follow up.   Specialty: Cardiology Why: Follow-up with Cardiology scheduled for 12/14/2020 at 8:20am in our [facility] office. Please arrive 15 minutes early for check-in. If this date/time does not work for you, please call our office to reschedule. Contact information: [address] [place] [address] [place] Follow up on 12/23/2020.   Why: 2:30 for hospital follow up  Contact information: [address] [place] [address] [phone]               The results of significant diagnostics from this hospitalization (including imaging, microbiology, ancillary and laboratory) are listed below for reference.    Significant Diagnostic Studies: DG Chest 2 View  Result Date: 11/18/2020 CLINICAL DATA:  Cough and shortness of breath. EXAM: CHEST - 2 VIEW COMPARISON:  Chest x-ray from yesterday. FINDINGS: Normal heart size. Unchanged right hilar and upper mediastinal lymphadenopathy. The lungs remain hyperinflated with emphysematous changes. Unchanged small right greater than left pleural effusions with bibasilar atelectasis. IMPRESSION: 1. Unchanged small right greater than left pleural effusions with bibasilar atelectasis. 2. Unchanged thoracic lymphadenopathy. 3. COPD. Electronically Signed   By: Titus Dubin M.D.   On: 11/18/2020 10:59    DG Chest 2 View  Result Date: 11/17/2020 CLINICAL DATA:  Pericardial drain removed. EXAM: CHEST - 2 VIEW COMPARISON:  11/16/2020 and multiple previous FINDINGS: Pericardial drain is been removed. Lead artifacts overlie the mediastinum and heart. Small left effusion with left lower lobe volume loss. This is slightly improved. Small to moderate size right effusion with right lower lung volume loss, also improved. Emphysema and scarring again noted at both lung apices. IMPRESSION: Interval removal of pericardial drain. Slight improvement in small left effusion and left lower lobe volume loss. Small to moderate right effusion with right lower lung volume loss, also slightly improved. Electronically Signed  By: Nelson Chimes M.D.   On: 11/17/2020 08:47   DG Chest 2 View  Result Date: 11/09/2020 CLINICAL DATA:  Back pain and productive cough. EXAM: CHEST - 2 VIEW COMPARISON:  None. FINDINGS: Lungs are hyperexpanded. Soft tissue fullness noted in the right hilum. Left lung unremarkable. No pleural effusion. The visualized bony structures of the thorax show no acute abnormality. IMPRESSION: Soft tissue fullness in the right hilum. CT chest with contrast recommended to further evaluate as lymphadenopathy/neoplasm a concern. Electronically Signed   By: Misty Stanley M.D.   On: 11/09/2020 20:21   DG Abd 1 View  Result Date: 11/19/2020 CLINICAL DATA:  Abdomen pain EXAM: ABDOMEN - 1 VIEW COMPARISON:  CT 11/10/2020 FINDINGS: Small bilateral pleural effusions. Nonobstructed gas pattern with residual contrast in the right colon. No radiopaque calculi. IMPRESSION: Nonobstructed gas pattern. Electronically Signed   By: Donavan Foil M.D.   On: 11/19/2020 00:02   CT Chest W Contrast  Addendum Date: 11/10/2020   ADDENDUM REPORT: 11/10/2020 01:06 ADDENDUM: Additional small centrally necrotic appearing deposits appear to be located either along or adjacent the right hemidiaphragm, either within the lung base or subphrenic  space (3/150). Addendum was called by telephone at the time of interpretation on 11/10/2020 at 1:06 am to provider GARRETT GREEN , who verbally acknowledged these results. Electronically Signed   By: Lovena Le M.D.   On: 11/10/2020 01:06   Result Date: 11/10/2020 CLINICAL DATA:  Abnormal radiograph, concern for mass EXAM: CT CHEST WITH CONTRAST TECHNIQUE: Multidetector CT imaging of the chest was performed during intravenous contrast administration. CONTRAST:  19mL OMNIPAQUE IOHEXOL 300 MG/ML  SOLN COMPARISON:  Radiograph 11/09/2020 FINDINGS: Cardiovascular: Normal cardiac size. Intermediate attenuation pericardial effusion, possibly malignant given the bulky mediastinal and hilar nodal disease. The aortic root is suboptimally assessed given cardiac pulsation artifact. The aorta is normal caliber. Aberrant right subclavian artery with small diverticulum of Kommerell. Proximal great vessels are otherwise unremarkable. Central pulmonary arteries are top-normal caliber. No large central or lobar filling defects within limitations of this non tailored examination of the pulmonary arteries. Reflux of contrast into the IVC and hepatic veins. Hypoattenuating filling defect is seen extending from the within the left brachiocephalic vein 2/84-13, clear this reflects direct extension or merely thrombus in the setting of compression by the bulky disease Mediastinum/Nodes: Extensive mediastinal, hilar and supraclavicular adenopathy throughout the chest bilaterally. Some central hypoattenuation concerning necrotic adenopathy. Largest supraclavicular nodal deposit noted on the right measuring up to 3.1 x 3 cm in size (3/23). Paratracheal node measuring 3.3 x 1.9 cm (3/70). Right hilar deposit measuring 3.3 x 3.2 cm (3/90). Conglomerate adenopathy noted in the AP window and prevascular space as well. No acute abnormality of the trachea or esophagus. Hypoattenuating nodule in the thyroid isthmus measuring up to 1.7 cm in size.  Lungs/Pleura: Centrally hypoattenuating lobular mass lesion seen in the posterosuperior segment right lower lobe measuring approximately 2.3 x 2.4 cm in size (4/93). Additional smaller nodules are seen throughout both lungs. Findings are on a background of diffuse centrilobular and paraseptal emphysematous changes as well as bronchitic features with airways thickening and scattered secretions. Regions of bandlike scarring and/or atelectasis are noted throughout both lungs. Trace right effusion with adjacent atelectasis. No left effusion. Upper Abdomen: Heterogeneous, largely hypoattenuating focus in the tail of the pancreas measuring 2.5 x 2.1 cm (3/21). Indeterminate hyperattenuating focus in the posterior right lobe liver measuring 1.3 x 1.3 cm (3/182), possible flash filling hemangioma though should be viewed  with conspicuity given additional findings. No other acute abnormality in the upper abdomen. Musculoskeletal: Lucent lesion seen involving the T9 anterior inferior vertebral body with some mild soft tissue extension. Additional lucent lesion in the posterior left ninth rib (3/114) some ill defined sclerotic change in the right seventh rib laterally. No other consider acute or conspicuous osseous lesions. IMPRESSION: 1. 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 Differential considerations could include a primary pulmonary malignancy with widespread metastases versus metastatic disease from a partially visualized pancreatic tail lesion. Alternatively, atypical infection such as mycobacterial/tuberculosis should be excluded as well. 2. Trace right pleural effusion. 3. Reflux of contrast in the hepatic veins and IVC could reflect some  elevated right heart pressures/right-sided dysfunction. Correlate with echocardiogram particularly given the presence pericardial effusion. 4. Hypoattenuating filling defect is seen in the left brachiocephalic vein, unclear if this could reflect extension from the nodal disease or merely bland thrombus with compression of the left brachiocephalic vein as it passes through the extensive prevascular deposits. No large central or lobar filling defects are seen within the pulmonary arteries though evaluation limited on this non tailored examination. 5. Lucent lesion involving the T9 anterior inferior vertebral body with some mild soft tissue extension. Additional lucent lesion in the posterior left ninth rib. Additional ill defined sclerotic change in the right seventh rib laterally. 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 6. Indeterminate hyperattenuating focus in the posterior right lobe liver measuring 1.3 x 1.3 cm, possible flash filling hemangioma though should be viewed with conspicuity given additional findings. 7. Hypoattenuating 1.7 cm nodule in the thyroid isthmus, Recommend thyroid US (ref: J Am Coll Radiol. 2015 Feb;12(2): 143-50). 8. Aberrant right subclavian artery. 9. Emphysema (ICD10-J43.9). These results were called by telephone at the time of interpretation on 11/10/2020 at 12:49 am to provider PA McDonald, who verbally acknowledged these results. Electronically Signed: By: Lovena Le M.D. On: 11/10/2020 00:51   CT ABDOMEN PELVIS W CONTRAST  Result Date: 11/10/2020 CLINICAL DATA:  Metastatic disease evaluation EXAM: CT ABDOMEN AND PELVIS WITH CONTRAST TECHNIQUE: Multidetector CT imaging of the abdomen and pelvis was performed using the standard protocol following bolus administration of intravenous contrast. CONTRAST:  11mL OMNIPAQUE IOHEXOL 300 MG/ML  SOLN COMPARISON:  Same-day CT chest, 11/10/2020 FINDINGS: Lower chest: Small right pleural effusion, increased compared to prior  examination. Large pericardial effusion, similar to prior. Hepatobiliary: No solid liver abnormality is seen. Periportal edema. Pericholecystic fluid, nonspecific in the setting of ascites. No gallstones, gallbladder wall thickening, or biliary dilatation. Pancreas: Unremarkable. No pancreatic ductal dilatation or surrounding inflammatory changes. Spleen: Normal in size without significant abnormality. Adrenals/Urinary Tract: Adrenal glands are unremarkable. Kidneys are normal, without renal calculi, solid lesion, or hydronephrosis. Bladder is unremarkable. Stomach/Bowel: Stomach is within normal limits. Appendix appears normal. No evidence of bowel wall thickening, distention, or inflammatory changes. Vascular/Lymphatic: Aortic atherosclerosis. No enlarged abdominal or pelvic lymph nodes. Reproductive: No mass or other significant abnormality. Other: No abdominal wall hernia or abnormality. Small volume ascites throughout the abdomen and pelvis. Musculoskeletal: Small lucent and rim sclerotic lesions noted in the pelvis, for example in the right ilium (series 3, image 54, 57). IMPRESSION: 1. No CT evidence of lymphadenopathy soft tissue metastatic disease in the abdomen or pelvis. 2. Small lucent and rim sclerotic lesions noted  in the bony pelvis, suspicious for subtle osseous metastatic disease given the presence of a presumed metastatic lesion of the T9 vertebral body. PET-CT is more sensitive for the detection of subtle metabolically active osseous metastatic disease. 3. Small volume ascites throughout the abdomen and pelvis, nonspecific. 4. Periportal edema and pericholecystic fluid, nonspecific in the setting of ascites. 5. Small right pleural effusion, increased compared to prior examination. 6. Large pericardial effusion, similar to prior examination. Aortic Atherosclerosis (ICD10-I70.0). Electronically Signed   By: Eddie Candle M.D.   On: 11/10/2020 12:46   DG Chest Port 1 View  Result Date:  11/16/2020 CLINICAL DATA:  Pericardial effusion.  Chest pain EXAM: PORTABLE CHEST 1 VIEW COMPARISON:  November 14, 2020 chest radiograph and chest CT FINDINGS: Pericardial drain in place. The heart size and pulmonary vascularity appear within normal limits. There are pleural effusions bilaterally with bibasilar atelectasis. Fullness in the right paratracheal region is stable, consistent with adenopathy noted on recent CT. There is a degree of right hilar and azygos region adenopathy as well. No bone lesions. Postoperative change lower cervical region. IMPRESSION: Pericardial drain in place.  Heart size normal. Pleural effusions bilaterally with bibasilar atelectasis. Areas of adenopathy in the right paratracheal, azygos, and right hilar regions seen by radiography. Adenopathy in other areas better seen on CT. Electronically Signed   By: Lowella Grip III M.D.   On: 11/16/2020 07:59   DG CHEST PORT 1 VIEW  Result Date: 11/14/2020 CLINICAL DATA:  pain, chest pain and cough.  Tobacco use. EXAM: PORTABLE CHEST 1 VIEW COMPARISON:  Chest x-ray 11/13/2020, CT chest 11/10/2020, CT chest 11/10/2020 FINDINGS: Hyperinflation with cystic changes consistent with emphysema. The heart size and mediastinal contours are unchanged. Persistent right hilar prominence. Bilateral trace to small volume pleural effusions, likely right greater than left. Right lower lobe opacity. No pneumothorax. No acute osseous abnormality. IMPRESSION: 1. Similar findings with persistent right lower lung zone opacity as well as bilateral trace to small volume pleural effusions, likely right greater than left. 2.  Emphysema (ICD10-J43.9). Electronically Signed   By: Iven Finn M.D.   On: 11/14/2020 06:59   DG CHEST PORT 1 VIEW  Result Date: 11/13/2020 CLINICAL DATA:  Pericardial effusion EXAM: PORTABLE CHEST 1 VIEW COMPARISON:  November 12, 2020 FINDINGS: Apparent pericardial drain present, unchanged in position. Heart size within normal limits.  Pulmonary vascularity within normal limits. There are pleural effusions bilaterally with consolidation in the left lower lobe. There is apparent bullous disease in the left apex region. No adenopathy evident. Postoperative change lower cervical region. IMPRESSION: Apparent pericardial drain present. Heart size within normal limits. Pleural effusions bilaterally. Left lower lobe consolidation may in part be due to atelectasis; a degree of superimposed pneumonia in the left lower lobe cannot be excluded. Bullous disease noted left apex. Electronically Signed   By: Lowella Grip III M.D.   On: 11/13/2020 08:32   DG CHEST PORT 1 VIEW  Result Date: 11/12/2020 CLINICAL DATA:  Exudative pericardial effusion status post pericardial window EXAM: PORTABLE CHEST 1 VIEW COMPARISON:  11/11/2020 FINDINGS: Right basilar consolidation and associated small right pleural effusion is stable. Mild interval improvement in interstitial infiltrate within the right upper lobe. No typical biapical cicatricial change or cavitary lesions identified to suggest granulomatous infection. Small left basilar pleural effusion is again noted. No pneumothorax. Cardiac size within normal limits. Right hilar enlargement again seen in keeping with right hilar adenopathy better seen on prior CT examination. IMPRESSION: Minimally improved right lung pulmonary  infiltrate. Right basilar consolidation and associated small right pleural effusion again noted. Small left pleural effusion present. Enlargement of the right hilum related to adenopathy unchanged. Electronically Signed   By: Fidela Salisbury MD   On: 11/12/2020 06:29   DG CHEST PORT 1 VIEW  Result Date: 11/11/2020 CLINICAL DATA:  Hemoptysis.  Pericardial effusion. EXAM: PORTABLE CHEST 1 VIEW COMPARISON:  11/10/2020.  CT 11/10/2020. FINDINGS: Mediastinum and right hilar fullness again noted consistent with previously visualized adenopathy. Right base infiltrate again noted. Reference is made  to recent chest CT report for discussion of extensive findings throughout the chest. No prominent pleural effusion. No pneumothorax. Pericardial drain in stable position. Heart size stable. No pulmonary venous congestion. Prior cervical spine fusion. Reference is made to chest CT report for discussion of bone lesions present. IMPRESSION: 1. Mediastinal and right hilar fullness again noted consistent with previously visualized adenopathy. Right base infiltrate again noted. No interim change. 2. Pericardial drain in stable position. No pneumothorax. Heart size stable. No pulmonary venous congestion. Chest is unchanged from prior exam. Reference is made to recent chest CT report for discussion of extensive findings throughout the chest. Electronically Signed   By: Marcello Moores  Register   On: 11/11/2020 08:29   DG Chest Port 1 View  Result Date: 11/10/2020 CLINICAL DATA:  Pericardial effusion status post drainage. EXAM: PORTABLE CHEST 1 VIEW COMPARISON:  CT chest from same day.  Chest x-ray from yesterday. FINDINGS: New pericardial drain. Normal heart size. Normal pulmonary vascularity. Unchanged right paratracheal and hilar fullness related to underlying lymphadenopathy. Emphysematous changes again noted. New patchy airspace disease at the right lung base. No pleural effusion or pneumothorax. IMPRESSION: 1. New pericardial drain. 2. New patchy airspace disease at the right lung base, suspicious for aspiration. 3. Unchanged mediastinal and right hilar lymphadenopathy. Electronically Signed   By: Titus Dubin M.D.   On: 11/10/2020 20:54   ECHOCARDIOGRAM COMPLETE  Result Date: 11/10/2020    ECHOCARDIOGRAM REPORT   Patient Name:   Brian Leonard Date of Exam: 11/10/2020 Medical Rec #:  [id number]      Height:       71.0 in Accession #:    [id number]     Weight:       136.0 lb Date of Birth:  09/04/1961      BSA:          1.790 m Patient Age:    60 years       BP:           112/79 mmHg Patient Gender: M              HR:            110 bpm. Exam Location:  Inpatient Procedure: 2D Echo Indications:    pericardial effusion  History:        Patient has no prior history of Echocardiogram examinations.                 Signs/Symptoms:Shortness of Breath.  Sonographer:    Johny Chess Referring Phys: [id number] [place]  1. Left ventricular ejection fraction, by estimation, is 55 to 60%. The left ventricle has normal function. The left ventricle has no regional wall motion abnormalities. Indeterminate diastolic filling due to E-A fusion.  2. Right ventricular systolic function is normal. The right ventricular size is normal. The estimated right ventricular systolic pressure is 63.7 mmHg.  3. Moderate pericardial effusion. The pericardial effusion is circumferential. Findings are consistent with cardiac  tamponade.  4. The mitral valve is normal in structure. No evidence of mitral valve regurgitation.  5. The aortic valve is tricuspid. Aortic valve regurgitation is not visualized. No aortic stenosis is present.  6. The inferior vena cava is dilated in size with <50% respiratory variability, suggesting right atrial pressure of 15 mmHg. FINDINGS  Left Ventricle: Left ventricular ejection fraction, by estimation, is 55 to 60%. The left ventricle has normal function. The left ventricle has no regional wall motion abnormalities. The left ventricular internal cavity size was normal in size. There is  no left ventricular hypertrophy. Indeterminate diastolic filling due to E-A fusion. Right Ventricle: The right ventricular size is normal. No increase in right ventricular wall thickness. Right ventricular systolic function is normal. The tricuspid regurgitant velocity is 1.57 m/s, and with an assumed right atrial pressure of 15 mmHg, the estimated right ventricular systolic pressure is 27.2 mmHg. Left Atrium: Left atrial size was normal in size. Right Atrium: Right atrial size was normal in size. Pericardium: A moderately sized  pericardial effusion is present. The pericardial effusion is circumferential. There is excessive respiratory variation in septal movement, diastolic collapse of the right ventricular free wall, diastolic collapse of the right atrial wall, excessive respiratory variation in the mitral valve spectral Doppler velocities and excessive respiratory variation in the tricuspid valve spectral Doppler velocities. There is evidence of cardiac tamponade. Mitral Valve: The mitral valve is normal in structure. No evidence of mitral valve regurgitation. Tricuspid Valve: The tricuspid valve is normal in structure. Tricuspid valve regurgitation is not demonstrated. Aortic Valve: The aortic valve is tricuspid. Aortic valve regurgitation is not visualized. No aortic stenosis is present. Pulmonic Valve: The pulmonic valve was normal in structure. Pulmonic valve regurgitation is not visualized. Aorta: The aortic root is normal in size and structure. Venous: The inferior vena cava is dilated in size with less than 50% respiratory variability, suggesting right atrial pressure of 15 mmHg. IAS/Shunts: No atrial level shunt detected by color flow Doppler.  LEFT VENTRICLE PLAX 2D LVIDd:         4.20 cm Diastology LVIDs:         2.15 cm LV e' medial:    7.83 cm/s LV PW:         1.00 cm LV E/e' medial:  4.5 LV IVS:        0.90 cm LV e' lateral:   7.51 cm/s                        LV E/e' lateral: 4.7  RIGHT VENTRICLE             IVC RV S prime:     26.80 cm/s  IVC diam: 2.70 cm TAPSE (M-mode): 2.3 cm LEFT ATRIUM             Index       RIGHT ATRIUM          Index LA diam:        2.20 cm 1.23 cm/m  RA Area:     7.14 cm LA Vol (A2C):   16.3 ml 9.11 ml/m  RA Volume:   12.30 ml 6.87 ml/m LA Vol (A4C):   18.3 ml 10.22 ml/m LA Biplane Vol: 19.1 ml 10.67 ml/m  AORTIC VALVE LVOT Vmax:   66.60 cm/s LVOT Vmean:  44.300 cm/s LVOT VTI:    0.089 m  AORTA Ao Asc diam: 3.30 cm MV E velocity: 35.10 cm/s  TRICUSPID VALVE MV  A velocity: 57.80 cm/s  TR Peak  grad:   9.9 mmHg MV E/A ratio:  0.61        TR Vmax:        157.46 cm/s                             SHUNTS                            Systemic VTI: 0.09 m Dani Gobble Croitoru MD Electronically signed by Sanda Klein MD Signature Date/Time: 11/10/2020/6:07:36 PM    Final     Microbiology: Recent Results (from the past 240 hour(s))  Resp Panel by RT-PCR (Flu A&B, Covid) Nasopharyngeal Swab     Status: None   Collection Time: 11/10/20  1:55 AM   Specimen: Nasopharyngeal Swab; Nasopharyngeal(NP) swabs in vial transport medium  Result Value Ref Range Status   SARS Coronavirus 2 by RT PCR NEGATIVE NEGATIVE Final    Comment: (NOTE) SARS-CoV-2 target nucleic acids are NOT DETECTED.  The SARS-CoV-2 RNA is generally detectable in upper respiratory specimens during the acute phase of infection. The lowest concentration of SARS-CoV-2 viral copies this assay can detect is 138 copies/mL. A negative result does not preclude SARS-Cov-2 infection and should not be used as the sole basis for treatment or other patient management decisions. A negative result may occur with  improper specimen collection/handling, submission of specimen other than nasopharyngeal swab, presence of viral mutation(s) within the areas targeted by this assay, and inadequate number of viral copies(<138 copies/mL). A negative result must be combined with clinical observations, patient history, and epidemiological information. The expected result is Negative.  Fact Sheet for Patients:  EntrepreneurPulse.com.au  Fact Sheet for Healthcare Providers:  IncredibleEmployment.be  This test is no t yet approved or cleared by the [place] FDA and  has been authorized for detection and/or diagnosis of SARS-CoV-2 by FDA under an Emergency Use Authorization (EUA). This EUA will remain  in effect (meaning this test can be used) for the duration of the COVID-19 declaration under Section 564(b)(1) of the  Act, 21 U.S.C.section 360bbb-3(b)(1), unless the authorization is terminated  or revoked sooner.       Influenza A by PCR NEGATIVE NEGATIVE Final   Influenza B by PCR NEGATIVE NEGATIVE Final    Comment: (NOTE) The Xpert Xpress SARS-CoV-2/FLU/RSV plus assay is intended as an aid in the diagnosis of influenza from Nasopharyngeal swab specimens and should not be used as a sole basis for treatment. Nasal washings and aspirates are unacceptable for Xpert Xpress SARS-CoV-2/FLU/RSV testing.  Fact Sheet for Patients: EntrepreneurPulse.com.au  Fact Sheet for Healthcare Providers: IncredibleEmployment.be  This test is not yet approved or cleared by the [place] FDA and has been authorized for detection and/or diagnosis of SARS-CoV-2 by FDA under an Emergency Use Authorization (EUA). This EUA will remain in effect (meaning this test can be used) for the duration of the COVID-19 declaration under Section 564(b)(1) of the Act, 21 U.S.C. section 360bbb-3(b)(1), unless the authorization is terminated or revoked.  Performed at [facility], [place] [address], [place], [place] [address]   MRSA PCR Screening     Status: None   Collection Time: 11/11/20  2:28 AM   Specimen: Nasal Mucosa; Nasopharyngeal  Result Value Ref Range Status   MRSA by PCR NEGATIVE NEGATIVE Final    Comment:  The GeneXpert MRSA Assay (FDA approved for NASAL specimens only), is one component of a comprehensive MRSA colonization surveillance program. It is not intended to diagnose MRSA infection nor to guide or monitor treatment for MRSA infections. Performed at [facility], [place] [address], [place], [place] [address]      Labs: Basic Metabolic Panel: Recent Labs  Lab 11/13/20 [phone] 11/14/20 0748 11/15/20 0125 11/16/20 0106 11/17/20 0110 11/18/20 0023 11/18/20 1931 11/19/20 0037  NA 132*   < > 131* 132* 132* 132*  --  132*  K 3.9   < > 4.2 4.3 3.7  4.3  --  4.4  CL 99   < > 98 98 97* 95*  --  96*  CO2 24   < > 28 28 27 28   --  25  GLUCOSE 101*   < > 110* 106* 113* 108*  --  124*  BUN 10   < > 11 9 10 11   --  12  CREATININE 0.77   < > 0.90 0.83 0.88 0.89  --  0.87  CALCIUM 8.6*   < > 8.4* 8.7* 9.0 9.3  --  9.7  MG 1.9  --   --   --   --   --  1.9 1.8   < > = values in this interval not displayed.   Liver Function Tests: Recent Labs  Lab 11/19/20 0037  AST 18  ALT 17  ALKPHOS 105  BILITOT 0.9  PROT 5.8*  ALBUMIN 2.9*   No results for input(s): LIPASE, AMYLASE in the last 168 hours. No results for input(s): AMMONIA in the last 168 hours. CBC: Recent Labs  Lab 11/13/20 0828 11/14/20 0748 11/16/20 0106 11/18/20 0023 11/19/20 0037  WBC 9.4 7.9 8.7 9.9 12.7*  NEUTROABS  --   --   --   --  10.7*  HGB 12.0* 11.8* 11.0* 11.1* 11.3*  HCT 34.4* 33.8* 31.9* 31.7* 31.8*  MCV 93.7 93.4 93.3 94.3 92.7  PLT 266 290 344 371 341   Cardiac Enzymes: No results for input(s): CKTOTAL, CKMB, CKMBINDEX, TROPONINI in the last 168 hours. BNP: BNP (last 3 results) No results for input(s): BNP in the last 8760 hours.  ProBNP (last 3 results) No results for input(s): PROBNP in the last 8760 hours.  CBG: Recent Labs  Lab 11/18/20 0638 11/18/20 1109 11/18/20 1539 11/18/20 2133 11/19/20 0601  GLUCAP 135* 109* 117* 122* 122*       Signed:  Nita Sells MD   [facility] 11/19/2020, 9:08 AM

## 2020-11-19 NOTE — TOC Progression Note (Signed)
Transition of Care [facility]) - Progression Note    Patient Details  Name: Brian Leonard MRN: [id number] Date of Birth: Apr 01, 1961  [facility]) CM/SW Contact  Angelita Ingles, RN Phone Number: [phone]  11/19/2020, 11:06 AM  Clinical Narrative:    CM at bedside to give patient MATCH form to cover 30 day supply of meds. Patient has already been discharged and has left the building. Bedside nurse made aware and states patient was in a rush to leave. Match letter left with nurse just in case patient calls back about medications.         Expected Discharge Plan and Services           Expected Discharge Date: 11/19/20                                     Social Determinants of Health (SDOH) Interventions    Readmission Risk Interventions No flowsheet data found.

## 2020-11-19 NOTE — Progress Notes (Incomplete)
Pt provided with verbal

## 2020-11-20 ENCOUNTER — Other Ambulatory Visit: Payer: Self-pay

## 2020-11-20 ENCOUNTER — Encounter (HOSPITAL_COMMUNITY): Payer: Self-pay

## 2020-11-20 ENCOUNTER — Inpatient Hospital Stay: Payer: Medicaid Other

## 2020-11-20 ENCOUNTER — Inpatient Hospital Stay (HOSPITAL_COMMUNITY)
Admission: EM | Admit: 2020-11-20 | Discharge: 2020-12-04 | DRG: 064 | Disposition: E | Payer: Medicaid Other | Attending: Internal Medicine | Admitting: Internal Medicine

## 2020-11-20 ENCOUNTER — Emergency Department (HOSPITAL_COMMUNITY): Payer: Medicaid Other

## 2020-11-20 ENCOUNTER — Inpatient Hospital Stay (HOSPITAL_BASED_OUTPATIENT_CLINIC_OR_DEPARTMENT_OTHER): Payer: Medicaid Other | Admitting: Internal Medicine

## 2020-11-20 ENCOUNTER — Encounter: Payer: Self-pay | Admitting: Internal Medicine

## 2020-11-20 ENCOUNTER — Other Ambulatory Visit (HOSPITAL_COMMUNITY): Payer: Self-pay

## 2020-11-20 ENCOUNTER — Encounter: Payer: Self-pay | Admitting: General Practice

## 2020-11-20 ENCOUNTER — Encounter: Payer: Self-pay | Admitting: *Deleted

## 2020-11-20 ENCOUNTER — Inpatient Hospital Stay (HOSPITAL_COMMUNITY): Payer: Medicaid Other

## 2020-11-20 VITALS — BP 82/69 | HR 110 | Temp 98.1°F | Resp 20 | Ht 71.0 in | Wt 132.1 lb

## 2020-11-20 DIAGNOSIS — R29719 NIHSS score 19: Secondary | ICD-10-CM | POA: Diagnosis not present

## 2020-11-20 DIAGNOSIS — C7951 Secondary malignant neoplasm of bone: Secondary | ICD-10-CM | POA: Insufficient documentation

## 2020-11-20 DIAGNOSIS — R29810 Facial weakness: Secondary | ICD-10-CM | POA: Diagnosis present

## 2020-11-20 DIAGNOSIS — J9601 Acute respiratory failure with hypoxia: Secondary | ICD-10-CM | POA: Diagnosis present

## 2020-11-20 DIAGNOSIS — R918 Other nonspecific abnormal finding of lung field: Secondary | ICD-10-CM | POA: Insufficient documentation

## 2020-11-20 DIAGNOSIS — J189 Pneumonia, unspecified organism: Secondary | ICD-10-CM | POA: Diagnosis present

## 2020-11-20 DIAGNOSIS — C799 Secondary malignant neoplasm of unspecified site: Secondary | ICD-10-CM

## 2020-11-20 DIAGNOSIS — I3139 Other pericardial effusion (noninflammatory): Secondary | ICD-10-CM | POA: Diagnosis present

## 2020-11-20 DIAGNOSIS — I82290 Acute embolism and thrombosis of other thoracic veins: Secondary | ICD-10-CM | POA: Diagnosis present

## 2020-11-20 DIAGNOSIS — I639 Cerebral infarction, unspecified: Secondary | ICD-10-CM | POA: Diagnosis present

## 2020-11-20 DIAGNOSIS — Z20822 Contact with and (suspected) exposure to covid-19: Secondary | ICD-10-CM | POA: Diagnosis present

## 2020-11-20 DIAGNOSIS — R29715 NIHSS score 15: Secondary | ICD-10-CM | POA: Diagnosis not present

## 2020-11-20 DIAGNOSIS — Z79899 Other long term (current) drug therapy: Secondary | ICD-10-CM | POA: Insufficient documentation

## 2020-11-20 DIAGNOSIS — R778 Other specified abnormalities of plasma proteins: Secondary | ICD-10-CM | POA: Diagnosis not present

## 2020-11-20 DIAGNOSIS — Z515 Encounter for palliative care: Secondary | ICD-10-CM

## 2020-11-20 DIAGNOSIS — C787 Secondary malignant neoplasm of liver and intrahepatic bile duct: Secondary | ICD-10-CM | POA: Diagnosis present

## 2020-11-20 DIAGNOSIS — J440 Chronic obstructive pulmonary disease with acute lower respiratory infection: Secondary | ICD-10-CM | POA: Diagnosis present

## 2020-11-20 DIAGNOSIS — I959 Hypotension, unspecified: Secondary | ICD-10-CM | POA: Diagnosis present

## 2020-11-20 DIAGNOSIS — Z5112 Encounter for antineoplastic immunotherapy: Secondary | ICD-10-CM | POA: Insufficient documentation

## 2020-11-20 DIAGNOSIS — R471 Dysarthria and anarthria: Secondary | ICD-10-CM | POA: Diagnosis present

## 2020-11-20 DIAGNOSIS — I614 Nontraumatic intracerebral hemorrhage in cerebellum: Secondary | ICD-10-CM | POA: Diagnosis present

## 2020-11-20 DIAGNOSIS — N179 Acute kidney failure, unspecified: Secondary | ICD-10-CM | POA: Diagnosis present

## 2020-11-20 DIAGNOSIS — C7931 Secondary malignant neoplasm of brain: Secondary | ICD-10-CM | POA: Diagnosis present

## 2020-11-20 DIAGNOSIS — E46 Unspecified protein-calorie malnutrition: Secondary | ICD-10-CM | POA: Diagnosis present

## 2020-11-20 DIAGNOSIS — I63412 Cerebral infarction due to embolism of left middle cerebral artery: Principal | ICD-10-CM | POA: Diagnosis present

## 2020-11-20 DIAGNOSIS — R64 Cachexia: Secondary | ICD-10-CM | POA: Diagnosis present

## 2020-11-20 DIAGNOSIS — R590 Localized enlarged lymph nodes: Secondary | ICD-10-CM

## 2020-11-20 DIAGNOSIS — Z91013 Allergy to seafood: Secondary | ICD-10-CM

## 2020-11-20 DIAGNOSIS — R29708 NIHSS score 8: Secondary | ICD-10-CM | POA: Diagnosis present

## 2020-11-20 DIAGNOSIS — A419 Sepsis, unspecified organism: Secondary | ICD-10-CM | POA: Diagnosis present

## 2020-11-20 DIAGNOSIS — Z91041 Radiographic dye allergy status: Secondary | ICD-10-CM

## 2020-11-20 DIAGNOSIS — I313 Pericardial effusion (noninflammatory): Secondary | ICD-10-CM | POA: Diagnosis present

## 2020-11-20 DIAGNOSIS — Z5111 Encounter for antineoplastic chemotherapy: Secondary | ICD-10-CM | POA: Insufficient documentation

## 2020-11-20 DIAGNOSIS — R000 Tachycardia, unspecified: Secondary | ICD-10-CM | POA: Diagnosis present

## 2020-11-20 DIAGNOSIS — Z87891 Personal history of nicotine dependence: Secondary | ICD-10-CM | POA: Insufficient documentation

## 2020-11-20 DIAGNOSIS — R531 Weakness: Secondary | ICD-10-CM

## 2020-11-20 DIAGNOSIS — Z681 Body mass index (BMI) 19 or less, adult: Secondary | ICD-10-CM | POA: Diagnosis not present

## 2020-11-20 DIAGNOSIS — C259 Malignant neoplasm of pancreas, unspecified: Secondary | ICD-10-CM | POA: Diagnosis present

## 2020-11-20 DIAGNOSIS — C801 Malignant (primary) neoplasm, unspecified: Secondary | ICD-10-CM | POA: Insufficient documentation

## 2020-11-20 DIAGNOSIS — I480 Paroxysmal atrial fibrillation: Secondary | ICD-10-CM | POA: Diagnosis present

## 2020-11-20 DIAGNOSIS — E871 Hypo-osmolality and hyponatremia: Secondary | ICD-10-CM | POA: Diagnosis present

## 2020-11-20 DIAGNOSIS — C3491 Malignant neoplasm of unspecified part of right bronchus or lung: Secondary | ICD-10-CM | POA: Diagnosis present

## 2020-11-20 DIAGNOSIS — G893 Neoplasm related pain (acute) (chronic): Secondary | ICD-10-CM

## 2020-11-20 DIAGNOSIS — I63512 Cerebral infarction due to unspecified occlusion or stenosis of left middle cerebral artery: Secondary | ICD-10-CM

## 2020-11-20 DIAGNOSIS — G8191 Hemiplegia, unspecified affecting right dominant side: Secondary | ICD-10-CM | POA: Diagnosis present

## 2020-11-20 DIAGNOSIS — C3490 Malignant neoplasm of unspecified part of unspecified bronchus or lung: Secondary | ICD-10-CM

## 2020-11-20 DIAGNOSIS — Z66 Do not resuscitate: Secondary | ICD-10-CM | POA: Diagnosis not present

## 2020-11-20 LAB — CBC
HCT: 30.4 % — ABNORMAL LOW (ref 39.0–52.0)
Hemoglobin: 10.5 g/dL — ABNORMAL LOW (ref 13.0–17.0)
MCH: 32.7 pg (ref 26.0–34.0)
MCHC: 34.5 g/dL (ref 30.0–36.0)
MCV: 94.7 fL (ref 80.0–100.0)
Platelets: 262 10*3/uL (ref 150–400)
RBC: 3.21 MIL/uL — ABNORMAL LOW (ref 4.22–5.81)
RDW: 13.3 % (ref 11.5–15.5)
WBC: 17.9 10*3/uL — ABNORMAL HIGH (ref 4.0–10.5)
nRBC: 0 % (ref 0.0–0.2)

## 2020-11-20 LAB — CMP (CANCER CENTER ONLY)
ALT: 16 U/L (ref 0–44)
AST: 17 U/L (ref 15–41)
Albumin: 3.1 g/dL — ABNORMAL LOW (ref 3.5–5.0)
Alkaline Phosphatase: 140 U/L — ABNORMAL HIGH (ref 38–126)
Anion gap: 12 (ref 5–15)
BUN: 18 mg/dL (ref 6–20)
CO2: 23 mmol/L (ref 22–32)
Calcium: 10 mg/dL (ref 8.9–10.3)
Chloride: 96 mmol/L — ABNORMAL LOW (ref 98–111)
Creatinine: 1.06 mg/dL (ref 0.61–1.24)
GFR, Estimated: >60 mL/min (ref >60)
Glucose, Bld: 140 mg/dL — ABNORMAL HIGH (ref 70–99)
Potassium: 4.4 mmol/L (ref 3.5–5.1)
Sodium: 131 mmol/L — ABNORMAL LOW (ref 135–145)
Total Bilirubin: 0.7 mg/dL (ref 0.3–1.2)
Total Protein: 6.6 g/dL (ref 6.5–8.1)

## 2020-11-20 LAB — DIFFERENTIAL
Abs Immature Granulocytes: 0.09 10*3/uL — ABNORMAL HIGH (ref 0.00–0.07)
Basophils Absolute: 0 10*3/uL (ref 0.0–0.1)
Basophils Relative: 0 %
Eosinophils Absolute: 0 10*3/uL (ref 0.0–0.5)
Eosinophils Relative: 0 %
Immature Granulocytes: 1 %
Lymphocytes Relative: 5 %
Lymphs Abs: 0.9 10*3/uL (ref 0.7–4.0)
Monocytes Absolute: 1.4 10*3/uL — ABNORMAL HIGH (ref 0.1–1.0)
Monocytes Relative: 8 %
Neutro Abs: 15.6 10*3/uL — ABNORMAL HIGH (ref 1.7–7.7)
Neutrophils Relative %: 86 %

## 2020-11-20 LAB — COMPREHENSIVE METABOLIC PANEL
ALT: 23 U/L (ref 0–44)
AST: 34 U/L (ref 15–41)
Albumin: 2.9 g/dL — ABNORMAL LOW (ref 3.5–5.0)
Alkaline Phosphatase: 199 U/L — ABNORMAL HIGH (ref 38–126)
Anion gap: 14 (ref 5–15)
BUN: 24 mg/dL — ABNORMAL HIGH (ref 6–20)
CO2: 22 mmol/L (ref 22–32)
Calcium: 9.7 mg/dL (ref 8.9–10.3)
Chloride: 93 mmol/L — ABNORMAL LOW (ref 98–111)
Creatinine, Ser: 1.52 mg/dL — ABNORMAL HIGH (ref 0.61–1.24)
GFR, Estimated: 53 mL/min — ABNORMAL LOW (ref >60)
Glucose, Bld: 165 mg/dL — ABNORMAL HIGH (ref 70–99)
Potassium: 4.6 mmol/L (ref 3.5–5.1)
Sodium: 129 mmol/L — ABNORMAL LOW (ref 135–145)
Total Bilirubin: 0.8 mg/dL (ref 0.3–1.2)
Total Protein: 5.8 g/dL — ABNORMAL LOW (ref 6.5–8.1)

## 2020-11-20 LAB — CBC WITH DIFFERENTIAL (CANCER CENTER ONLY)
Abs Immature Granulocytes: 0.07 10*3/uL (ref 0.00–0.07)
Basophils Absolute: 0 10*3/uL (ref 0.0–0.1)
Basophils Relative: 0 %
Eosinophils Absolute: 0 10*3/uL (ref 0.0–0.5)
Eosinophils Relative: 0 %
HCT: 32.4 % — ABNORMAL LOW (ref 39.0–52.0)
Hemoglobin: 11.1 g/dL — ABNORMAL LOW (ref 13.0–17.0)
Immature Granulocytes: 0 %
Lymphocytes Relative: 5 %
Lymphs Abs: 0.8 10*3/uL (ref 0.7–4.0)
MCH: 31.7 pg (ref 26.0–34.0)
MCHC: 34.3 g/dL (ref 30.0–36.0)
MCV: 92.6 fL (ref 80.0–100.0)
Monocytes Absolute: 1.2 10*3/uL — ABNORMAL HIGH (ref 0.1–1.0)
Monocytes Relative: 7 %
Neutro Abs: 15.5 10*3/uL — ABNORMAL HIGH (ref 1.7–7.7)
Neutrophils Relative %: 88 %
Platelet Count: 290 10*3/uL (ref 150–400)
RBC: 3.5 MIL/uL — ABNORMAL LOW (ref 4.22–5.81)
RDW: 13 % (ref 11.5–15.5)
WBC Count: 17.6 10*3/uL — ABNORMAL HIGH (ref 4.0–10.5)
nRBC: 0 % (ref 0.0–0.2)

## 2020-11-20 LAB — CBG MONITORING, ED: Glucose-Capillary: 153 mg/dL — ABNORMAL HIGH (ref 70–99)

## 2020-11-20 LAB — I-STAT CHEM 8, ED
BUN: 32 mg/dL — ABNORMAL HIGH (ref 6–20)
Calcium, Ion: 1.29 mmol/L (ref 1.15–1.40)
Chloride: 93 mmol/L — ABNORMAL LOW (ref 98–111)
Creatinine, Ser: 1.3 mg/dL — ABNORMAL HIGH (ref 0.61–1.24)
Glucose, Bld: 159 mg/dL — ABNORMAL HIGH (ref 70–99)
HCT: 32 % — ABNORMAL LOW (ref 39.0–52.0)
Hemoglobin: 10.9 g/dL — ABNORMAL LOW (ref 13.0–17.0)
Potassium: 4.6 mmol/L (ref 3.5–5.1)
Sodium: 130 mmol/L — ABNORMAL LOW (ref 135–145)
TCO2: 26 mmol/L (ref 22–32)

## 2020-11-20 LAB — PROTIME-INR
INR: 1.5 — ABNORMAL HIGH (ref 0.8–1.2)
Prothrombin Time: 17.3 seconds — ABNORMAL HIGH (ref 11.4–15.2)

## 2020-11-20 LAB — APTT: aPTT: 31 seconds (ref 24–36)

## 2020-11-20 MED ORDER — LORAZEPAM 2 MG/ML IJ SOLN
1.0000 mg | Freq: Once | INTRAMUSCULAR | Status: AC
  Administered 2020-11-20: 1 mg via INTRAVENOUS
  Filled 2020-11-20: qty 1

## 2020-11-20 MED ORDER — SODIUM CHLORIDE 0.9 % IV BOLUS
1000.0000 mL | Freq: Once | INTRAVENOUS | Status: AC
  Administered 2020-11-20: 1000 mL via INTRAVENOUS

## 2020-11-20 MED ORDER — GADOBUTROL 1 MMOL/ML IV SOLN
6.0000 mL | Freq: Once | INTRAVENOUS | Status: AC | PRN
  Administered 2020-11-20: 6 mL via INTRAVENOUS

## 2020-11-20 MED ORDER — SODIUM CHLORIDE 0.9% FLUSH
3.0000 mL | Freq: Once | INTRAVENOUS | Status: AC
Start: 2020-11-20 — End: 2020-11-20
  Administered 2020-11-20: 3 mL via INTRAVENOUS

## 2020-11-20 NOTE — Consult Note (Addendum)
NEUROLOGY CONSULTATION NOTE   Date of service: November 20, 2020 Patient Name: Brian Leonard MRN:  [id number] DOB:  [birth year] Reason for consult: "stroke code" _ _ _   _ __   _ __ _ _  __ __   _ __   __ _  History of Present Illness  Brian Leonard is a 60 y.o. male with PMH significant for new diagnosis of stage 4 lung cancer with liver mets, new onset Afibb not on [facility], who presents with acute onset R sided weakness with R facial droop and arm and leg weakness. Symptoms improved in the ED. CTH w/o contrast with 1cm hyperdensity in the left cerebellum, hemorrhage vs hemorrhagic infarct vs cavernoma. He is allergic to iodinated contrast with anaphylaxis and thus CT Angios were not obtained. MRI Brain was obtained but only limited images could be done due to poor tolerance and refusal to continue with ativan or another medication.  He was at home with family when he had a syncopal episode, followed by R sided weakness. Family immediately called EMS and he was brought in. Symptoms improved in the ED and by the time I saw him after MRI, NIHSS was down to a   LKW: 1748. TPA: No, hemorrhagic mets. MRS: 2 Thrombectomy: Low NIHSS, poor baseline in the setting of his metastatic cancer with hemorrhagic brain mets. Left M3 on MR angio head too small for thrombectomy.  NIHSS components Score: Comment  1a Level of Conscious 0[x]  1[]  2[]  3[]      1b LOC Questions 0[x]  1[]  2[]       1c LOC Commands 0[x]  1[]  2[]       2 Best Gaze 0[x]  1[]  2[]       3 Visual 0[x]  1[]  2[]  3[]      4 Facial Palsy 0[]  1[x]  2[]  3[]      5a Motor Arm - left 0[x]  1[]  2[]  3[]  4[]  UN[]    5b Motor Arm - Right 0[x]  1[]  2[]  3[]  4[]  UN[]  Mild hand grip weakness  6a Motor Leg - Left 0[x]  1[]  2[]  3[]  4[]  UN[]    6b Motor Leg - Right 0[]  1[x]  2[]  3[]  4[]  UN[]    7 Limb Ataxia 0[]  1[x]  2[]  3[]  UN[]     8 Sensory 0[x]  1[]  2[]  UN[]      9 Best Language 0[x]  1[]  2[]  3[]      10 Dysarthria 0[x]  1[]  2[]  UN[]      11 Extinct. and Inattention 0[x]  1[]   2[]       TOTAL: 3       ROS   Unable to obtain most of the ROS as he declined to answer most of my questions.  Past History   Past Medical History:  Diagnosis Date  . Asthma    Past Surgical History:  Procedure Laterality Date  . CERVICAL SPINE SURGERY    . PERICARDIAL WINDOW N/A 11/10/2020   Procedure: PERICARDIAL WINDOW;  Surgeon: Gaye Pollack, MD;  Location: [facility] OR;  Service: Thoracic;  Laterality: N/A;   No family history on file. Social History   Socioeconomic History  . Marital status: Married    Spouse name: Not on file  . Number of children: Not on file  . Years of education: Not on file  . Highest education level: Not on file  Occupational History  . Not on file  Tobacco Use  . Smoking status: Former Smoker    Types: Cigarettes    Quit date: 11/09/2020    Years since quitting: 0.0  . Smokeless tobacco: Never Used  .  Tobacco comment: Stopped upon hospital admission 11/09/20  Substance and Sexual Activity  . Alcohol use: Yes  . Drug use: Yes    Types: Marijuana  . Sexual activity: Not on file  Other Topics Concern  . Not on file  Social History Narrative  . Not on file   Social Determinants of Health   Financial Resource Strain: Not on file  Food Insecurity: Not on file  Transportation Needs: Not on file  Physical Activity: Not on file  Stress: Not on file  Social Connections: Not on file   Allergies  Allergen Reactions  . Iodine Anaphylaxis  . Shellfish Allergy Anaphylaxis  . Other Hives and Swelling    Reaction to unknown oral asthma medication (pt has taken Theo Dur in the past - 10 yrs ago with no reaction)    Medications  (Not in a hospital admission)    Vitals   There were no vitals filed for this visit.   There is no height or weight on file to calculate BMI.  Physical Exam   General: Laying comfortably in bed; in no acute distress. HENT: Normal oropharynx and mucosa. Normal external appearance of ears and nose.  Neck: Supple,  no pain or tenderness  CV: No JVD. No peripheral edema.  Pulmonary: Symmetric Chest rise. Normal respiratory effort.  Abdomen: Soft to touch, non-tender.  Ext: No cyanosis, edema, or deformity  Skin: No rash. Normal palpation of skin.   Musculoskeletal: Normal digits and nails by inspection. No clubbing.   Neurologic Examination  Mental status/Cognition: Alert, oriented to self, place, month and year, good attention.  Speech/language: Fluent, comprehension intact, object naming intact, repetition intact.  Cranial nerves:   CN II Pupils equal and reactive to light, no VF deficits    CN III,IV,VI EOM intact, no gaze preference or deviation, no nystagmus    CN V normal sensation in V1, V2, and V3 segments bilaterally    CN VII R upper and lower facial droop   CN VIII normal hearing to speech    CN IX & X normal palatal elevation, no uvular deviation    CN XI 5/5 head turn and 5/5 shoulder shrug bilaterally   CN XII midline tongue protrusion    Motor:  Muscle bulk: poor, tone normal, pronator drift yes in RUE. tremor none Mvmt Root Nerve  Muscle Right Left Comments  SA C5/6 Ax Deltoid 5 5   EF C5/6 Mc Biceps 5 5   EE C6/7/8 Rad Triceps 5 5   WF C6/7 Med FCR     WE C7/8 PIN ECU     F Ab C8/T1 U ADM/FDI 5 4+   HF L1/2/3 Fem Illopsoas 4 4+   KE L2/3/4 Fem Quad 5 5   DF L4/5 D Peron Tib Ant 5 5   PF S1/2 Tibial Grc/Sol 5 5    Reflexes:  Right Left Comments  Pectoralis      Biceps (C5/6) 2 2   Brachioradialis (C5/6) 2 2    Triceps (C6/7) 2 2    Patellar (L3/4) 2 2    Achilles (S1)      Hoffman      Plantar     Jaw jerk    Sensation:  Light touch intact   Pin prick    Temperature    Vibration   Proprioception    Coordination/Complex Motor:  - Finger to Nose intact BL - Heel to shin unable to do. - Rapid alternating movement are slowed all over. -  Gait: Deferred.  Labs   CBC:  Recent Labs  Lab 11/08/2020 0850 11/05/2020 1828 11/24/2020 1833  WBC 17.6* 17.9*  --    NEUTROABS 15.5* 15.6*  --   HGB 11.1* 10.5* 10.9*  HCT 32.4* 30.4* 32.0*  MCV 92.6 94.7  --   PLT 290 262  --     Basic Metabolic Panel:  Lab Results  Component Value Date   NA 130 (L) 12/03/2020   K 4.6 11/16/2020   CO2 22 12/02/2020   GLUCOSE 159 (H) 11/17/2020   BUN 32 (H) 11/28/2020   CREATININE 1.30 (H) 11/27/2020   CALCIUM 9.7 11/06/2020   GFRNONAA 53 (L) 11/24/2020   GFRAA >60 11/08/2015   Lipid Panel: No results found for: LDLCALC HgbA1c:  Lab Results  Component Value Date   HGBA1C 5.9 (H) 11/11/2020   Urine Drug Screen: No results found for: LABOPIA, COCAINSCRNUR, LABBENZ, AMPHETMU, THCU, LABBARB  Alcohol Level No results found for: [facility]  CT Head without contrast: 1. 1 cm hyperdensity in the left cerebellum. This could represent a cerebellar hemorrhage, hemorrhagic infarction or possibly a cavernoma. 2. ASPECTS is 10.  MRI Brain  SWI lesions in BL cerebellum concerning for mets but also has patchy L MCA diffusion resitruction concerning for a stroke.  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  Impression   Taryll Reichenberger is a 60 y.o. male with PMH significant for new diagnosis of stage 4 lung cancer with liver mets, new onset Afibb not on [facility], who presents with acute onset R sided weakness with R facial droop and arm and leg weakness. His neurologic examination is notable for R sided weakness and facial droop. Not a tPA candidate due to hemorrhagic mets. Poor baseline in the setting of his new metastatic lung cancer with hemorrhagic brain mets, low NIHSS. He refused to sit still for MR angio neck.   Recommendations  Plan:  - Frequent Neuro checks per stroke unit protocol - I ordered MR Brain with contrast to evaluate for mets. - I ordered Carotid duplex. - Recommend obtaining TTE - Recommend obtaining Lipid panel with LDL - Please start statin if LDL > 70 - Recommend HbA1c - Antithrombotic - hold off given hemorrhagic mets. - Recommend DVT ppx - SBP goal - <  160/90. Hold home meds.  - Recommend Telemetry monitoring for arrythmia - Recommend bedside swallow screen prior to PO intake. - Stroke education booklet - Recommend PT/OT/SLP consult - I ordered rEEG - CTH w/o contrast in 6 hours to assess stability of hemorrhagic mets.  ______________________________________________________________________   Thank you for the opportunity to take part in the care of this patient. If you have any further questions, please contact the neurology consultation attending.  Signed,  [place] Pager Number [phone] _ _ _   _ __   _ __ _ _  __ __   _ __   __ _

## 2020-11-20 NOTE — ED Provider Notes (Signed)
[facility] Provider Note   CSN: [id number] Arrival date & time: 11/08/2020  7989     History Chief Complaint  Patient presents with   Code Stroke    Brian Leonard is a 60 y.o. male w/ hx of stave IV lung cancer, with bony mets, recently diagnosed, new onset A. fib not on anticoagulation, presented emergency department with right-sided weakness.  The patient is not able to provide a history due to weakness.  His wife at the bedside reports that she noted today when she went to the bathroom around 6 PM that he was drooping to the right side, with right facial droop, weakness in his right arm or leg, and slow speech.  She called EMS and brought the patient to the ED as a code stroke.  Patient denies to me that he is having a headache.  Per medical records, the patient was just discharged the hospital 2 days ago, after admission for new diagnosis of stage IV lung cancer with a necrotic appearing pneumonia and sepsis physiology.  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  He also had new onset A. fib at that time.  He was started amiodarone as well as metoprolol and also discharged magnesium tablets.  He was noted of heart rate in the 120s in the hospital.  Of note, the patient also underwent a subxiphoid pericardial window on March 8 with drainage of pericardial fluid pericardium consistent with metastatic adenocarcinoma.  He was noted to have a large pericardial effusion at that time with evidence of cardiac tamponade.  He has not had his PET scan or his full cancer staging and surveillance scan at this time.  He takes multiple pain medications at home.  He takes OxyContin twice a day, as well as Dilaudid for breakthrough pain.  His wife gave him his Dilaudid at 4 PM today.  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   Adenocarcinoma of right lung, stage 4 ([place]) 11/16/2020   Encounter for antineoplastic chemotherapy 11/28/2020   Encounter for antineoplastic immunotherapy 11/08/2020   Cancer associated pain 11/29/2020   Malnutrition, calorie ([place]) 11/30/2020   CVA (cerebral vascular accident) ([place]) 11/06/2020   PAF (paroxysmal atrial fibrillation) ([facility])    Pericardial effusion 21/19/4174   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 No pertinent family history.  Social History   Tobacco Use   Smoking status: Former Smoker    Types: Cigarettes    Quit date: 11/09/2020    Years since quitting: 0.0   Smokeless tobacco: Never Used   Tobacco comment: Stopped upon hospital admission 11/09/20  Substance Use Topics   Alcohol use: Yes   Drug use: Yes  Types: Marijuana    Home Medications Prior to Admission medications   Medication Sig Start Date End Date Taking? Authorizing Provider  amiodarone (PACERONE) 200 MG tablet Take 1 tablet (200 mg total) by mouth every 12 (twelve) hours for 2 days. 11/19/20 01-Dec-2020 Yes Nita Sells, MD  amiodarone (PACERONE) 200 MG tablet Take 1 tablet (200 mg total) by mouth daily. 12/01/20  Yes Nita Sells, MD  diphenhydrAMINE (BENADRYL) 25 mg capsule Take 1 capsule (25 mg total) by mouth at bedtime as needed for sleep. 11/19/20  Yes Nita Sells, MD  guaiFENesin (ROBITUSSIN) 100 MG/5ML SOLN Take 10 mLs (200 mg total) by mouth every 4 (four) hours as needed for cough or to loosen phlegm. 11/19/20  Yes Nita Sells, MD  HYDROmorphone HCl (DILAUDID) 1 MG/ML LIQD Take 1 mL (1 mg total) by mouth every 3 (three) hours as needed for severe pain. 11/19/20  Yes Domenic Polite, MD  levalbuterol [facility] HFA) 45 MCG/ACT inhaler Inhale 2 puffs into the lungs every 2 (two) hours as needed for wheezing. 11/19/20  Yes Nita Sells, MD  Lidocaine 0.5 % GEL Apply 1 each topically 3 (three) times daily. 11/19/20  Yes Nita Sells, MD  Magnesium Oxide 400 (240 Mg) MG TABS Take 1 tablet (400 mg total) by mouth in the morning and at bedtime. 11/19/20  Yes Nita Sells, MD  metoprolol tartrate (LOPRESSOR) 25 MG tablet Take 1 tablet (25 mg total) by mouth 2 (two) times daily. 11/19/20  Yes Nita Sells, MD  Multiple Vitamin (MULTIVITAMIN WITH MINERALS) TABS tablet Take 1 tablet by mouth daily.   Yes [provider]  nicotine (NICODERM CQ - DOSED IN MG/24 HOURS) 14 mg/24hr patch Place 1 patch (14 mg total) onto the skin daily. 11/19/20  Yes Nita Sells, MD  oxyCODONE (OXYCONTIN) 15 mg 12 hr tablet Take 1 tablet (15 mg total) by mouth every 12 (twelve) hours. 11/19/20  Yes Domenic Polite, MD  pantoprazole (PROTONIX) 40 MG tablet Take 1 tablet (40 mg total) by mouth 2 (two) times daily. 11/19/20  Yes Nita Sells, MD  polyethylene glycol (MIRALAX / GLYCOLAX) 17 g packet Take 17 g by mouth daily. 11/19/20  Yes Nita Sells, MD  senna-docusate (SENOKOT-S) 8.6-50 MG tablet Take 1 tablet by mouth 2 (two) times daily. 11/19/20  Yes Nita Sells, MD  traMADol (ULTRAM) 50 MG tablet Take 1-2 tablets (50-100 mg total) by mouth every 6 (six) hours as needed (mild pain). Patient not taking: No sig reported 11/19/20   Domenic Polite, MD    Allergies    Iodine, Shellfish allergy, and  Other  Review of Systems   Review of Systems  Constitutional: Positive for activity change and appetite change.  Eyes: Negative for pain and visual disturbance.  Respiratory: Negative for cough and shortness of breath.   Cardiovascular: Negative for chest pain and palpitations.  Gastrointestinal: Negative for abdominal pain and vomiting.  Musculoskeletal: Negative for arthralgias and back pain.  Skin: Negative for color change and rash.  Neurological: Positive for speech difficulty, weakness and headaches.  All other systems reviewed and are negative.   Physical Exam Updated Vital Signs BP 106/68 (BP Location: Right Arm)    Pulse (!) 127    Temp 98 F (36.7 C) (Oral)    Resp (!) 31    SpO2 95%   Physical Exam Constitutional:      Comments: Thin, cachetic, appears tired  HENT:     Head: Normocephalic and atraumatic.  Eyes:  Conjunctiva/sclera: Conjunctivae normal.     Pupils: Pupils are equal, round, and reactive to light.  Cardiovascular:     Rate and Rhythm: Normal rate and regular rhythm.  Pulmonary:     Effort: Pulmonary effort is normal. No respiratory distress.  Abdominal:     General: There is no distension.     Tenderness: There is no abdominal tenderness.  Skin:    General: Skin is warm and dry.  Neurological:     General: No focal deficit present.     Mental Status: He is alert. Mental status is at baseline.     Comments: Generalized poor effort on exam, right sided pronator drift, mild right facial droop, slow speech     ED Results / Procedures / Treatments   Labs (all labs ordered are listed, but only abnormal results are displayed) Labs Reviewed  PROTIME-INR - Abnormal; Notable for the following components:      Result Value   Prothrombin Time 17.3 (*)    INR 1.5 (*)    All other components within normal limits  CBC - Abnormal; Notable for the following components:   WBC 17.9 (*)    RBC 3.21 (*)    Hemoglobin 10.5 (*)    HCT 30.4 (*)    All  other components within normal limits  DIFFERENTIAL - Abnormal; Notable for the following components:   Neutro Abs 15.6 (*)    Monocytes Absolute 1.4 (*)    Abs Immature Granulocytes 0.09 (*)    All other components within normal limits  COMPREHENSIVE METABOLIC PANEL - Abnormal; Notable for the following components:   Sodium 129 (*)    Chloride 93 (*)    Glucose, Bld 165 (*)    BUN 24 (*)    Creatinine, Ser 1.52 (*)    Total Protein 5.8 (*)    Albumin 2.9 (*)    Alkaline Phosphatase 199 (*)    GFR, Estimated 53 (*)    All other components within normal limits  LACTIC ACID, PLASMA - Abnormal; Notable for the following components:   Lactic Acid, Venous 5.2 (*)    All other components within normal limits  HEMOGLOBIN A1C - Abnormal; Notable for the following components:   Hgb A1c MFr Bld 6.0 (*)    All other components within normal limits  CBC - Abnormal; Notable for the following components:   WBC 19.0 (*)    RBC 3.05 (*)    Hemoglobin 9.8 (*)    HCT 29.2 (*)    All other components within normal limits  BRAIN NATRIURETIC PEPTIDE - Abnormal; Notable for the following components:   B Natriuretic Peptide 564.8 (*)    All other components within normal limits  D-DIMER, QUANTITATIVE - Abnormal; Notable for the following components:   D-Dimer, Quant >20.00 (*)    All other components within normal limits  CBG MONITORING, ED - Abnormal; Notable for the following components:   Glucose-Capillary 153 (*)    All other components within normal limits  I-STAT CHEM 8, ED - Abnormal; Notable for the following components:   Sodium 130 (*)    Chloride 93 (*)    BUN 32 (*)    Creatinine, Ser 1.30 (*)    Glucose, Bld 159 (*)    Hemoglobin 10.9 (*)    HCT 32.0 (*)    All other components within normal limits  TROPONIN I (HIGH SENSITIVITY) - Abnormal; Notable for the following components:   Troponin I (High Sensitivity) 90 (*)  All other components within normal limits  TROPONIN I  (HIGH SENSITIVITY) - Abnormal; Notable for the following components:   Troponin I (High Sensitivity) 174 (*)    All other components within normal limits  CULTURE, BLOOD (ROUTINE X 2)  CULTURE, BLOOD (ROUTINE X 2)  SARS CORONAVIRUS 2 (TAT 6-24 HRS)  APTT  HIV ANTIBODY (ROUTINE TESTING W REFLEX)  LIPID PANEL  CBG MONITORING, ED    EKG None  Radiology CT HEAD WO CONTRAST  Result Date: Dec 13, 2020 CLINICAL DATA:  60 year old male with multiple ring-enhancing cerebellar lesions, some hemorrhagic on MRI suspicious for metastatic disease. Superimposed patchy left MCA infarct on MRI. Increasing right side weakness. EXAM: CT HEAD WITHOUT CONTRAST TECHNIQUE: Contiguous axial images were obtained from the base of the skull through the vertex without intravenous contrast. COMPARISON:  Brain MRI 11/24/2020 and earlier. FINDINGS: Brain: Small left cerebellar hyperdense lesion seems less discrete since the CT on 11/23/2020 (series 4, image 9 today). No significant posterior fossa mass effect or cerebellar edema at this time. Cytotoxic edema is now evident at the left insula (series 4, image 15) and appears larger from the recent DWI. Cortical edema also evident in the lateral perirolandic area series 4, image 21. No hemorrhage or mass effect associated with these areas. Elsewhere stable gray-white matter differentiation. No midline shift. No ventriculomegaly. No new intracranial hemorrhage identified. Vascular: Mild Calcified atherosclerosis at the skull base. No suspicious intracranial vascular hyperdensity. Skull: No acute or suspicious osseous lesion identified. Sinuses/Orbits: Visualized paranasal sinuses and mastoids are stable and well pneumatized. Other: Stable orbit and scalp soft tissues. Prominent superior ophthalmic veins again noted. IMPRESSION: 1. Mild expansion of the left MCA territory ischemia at the insula since 11/05/2020. No hemorrhagic transformation or intracranial mass effect. 2. Left  cerebellar hyperdense lesion appears mildly improved by CT since 11/28/2020. No new intracranial lesion or blood products identified. Electronically Signed   By: Genevie Ann M.D.   On: 2020-12-13 04:39   MR ANGIO HEAD WO CONTRAST  Result Date: 11/30/2020 CLINICAL DATA:  Follow-up examination for acute stroke. EXAM: MRI HEAD WITHOUT CONTRAST MRA HEAD WITHOUT CONTRAST TECHNIQUE: Multiplanar, multiecho pulse sequences of the brain and surrounding structures were obtained without intravenous contrast. Angiographic images of the head were obtained using MRA technique without contrast. COMPARISON:  Prior CT from earlier the same day. FINDINGS: MRI HEAD FINDINGS Brain: Examination degraded by motion artifact. Generalized age-related cerebral atrophy. Multiple scattered foci of diffusion abnormality seen involving the cortical and subcortical aspect of the left cerebral hemisphere, with involvement of the left insula and overlying left frontal parietal cortex. Minimal patchy involvement of the left basal ganglia and subinsular white matter. Associated minimal petechial hemorrhage at the left insula without hemorrhagic transformation. 1.4 cm focus of susceptibility artifact at the left cerebellum, consistent with a small intraparenchymal hemorrhage, corresponding with abnormality on prior CT (series 18, image 14). Finding could reflect a hemorrhage or possibly hemorrhagic infarction. In additional smaller 5 mm hemorrhage seen at the contralateral right cerebellum (series 18, image 11). Mild localized edema without significant regional mass effect. No other evidence for acute or chronic intracranial hemorrhage. No other mass lesion, midline shift or mass effect. No hydrocephalus or extra-axial fluid collection. Pituitary gland suprasellar region within normal limits. Midline structures intact. Vascular: Major intracranial vascular flow voids are maintained. Skull and upper cervical spine: Craniocervical junction within normal  limits. Normal bone marrow signal intensity. No focal marrow replacing lesion. No scalp soft tissue abnormality. Sinuses/Orbits: Globes and orbital soft  tissues within normal limits. Paranasal sinuses are clear. No significant mastoid effusion. Inner ear structures grossly normal. Other: None. MRA HEAD FINDINGS ANTERIOR CIRCULATION: Visualized distal cervical segments of the internal carotid arteries are patent with antegrade flow. Petrous, cavernous, and supraclinoid segments patent without stenosis. 3 mm focal outpouching arising from the supraclinoid right ICA could reflect a small vascular infundibulum related to a hypoplastic right PCOM versus small aneurysm (series 9, image 88). A1 segments patent bilaterally. Normal anterior communicating artery complex. Anterior cerebral arteries patent bilaterally. No M1 stenosis or occlusion. Normal MCA bifurcations. Distal right MCA branches well perfuse. On the left, susceptibility artifact is seen at the level of the right sylvian fissure, suspected to correspond with a distal right M3 occlusion, faintly seen on corresponding MRA (series 1070, image 18). Remainder of the distal left MCA branches perfused. POSTERIOR CIRCULATION: Visualized vertebral arteries widely patent to the vertebrobasilar junction. Both PICA origins patent and normal. Basilar patent to its distal aspect without stenosis. Superior cerebellar arteries patent bilaterally. 2 mm focal outpouching at the origin of the right SCA favored to reflect a small vascular infundibulum. Right PCA supplied via the basilar. Left PCA supplied via the basilar as well as a prominent left posterior communicating artery. Both PCAs well perfused to their distal aspects. IMPRESSION: MRI HEAD IMPRESSION: 1. 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 Associated faint petechial hemorrhage at the left insula without hemorrhagic transformation or significant mass effect. 2. 1.4 cm acute intraparenchymal  hemorrhage at the left cerebellum, corresponding with abnormality on prior CT. This could reflect a cerebellar hemorrhage or possibly hemorrhagic infarction. Additional smaller 5 mm hemorrhage at the contralateral right cerebellum. Clinical follow-up to resolution recommended to ensure no underlying lesions are present at these locations. MRA HEAD IMPRESSION: 1. Proximal left M3 occlusion, in keeping with the acute left MCA distribution infarct. 2. Otherwise wide patency of the major intracranial arterial circulation. No other proximal high-grade or correctable stenosis. 3. 3 mm outpouching arising from the supraclinoid right ICA, favored to reflect a small vascular infundibulum, although possible aneurysm difficult to exclude. Tension at follow-up recommended. Please note that an MRA neck was ordered for this exam, but was unable to be completed as the patient was unable to tolerate the full length of the study. No tract to should be applied. Electronically Signed   By: Jeannine Boga M.D.   On: 11/17/2020 20:22   MR ANGIO NECK W WO CONTRAST  Result Date: 11/29/2020 CLINICAL DATA:  Follow-up examination for acute stroke. EXAM: MRI HEAD WITHOUT CONTRAST MRA HEAD WITHOUT CONTRAST TECHNIQUE: Multiplanar, multiecho pulse sequences of the brain and surrounding structures were obtained without intravenous contrast. Angiographic images of the head were obtained using MRA technique without contrast. COMPARISON:  Prior CT from earlier the same day. FINDINGS: MRI HEAD FINDINGS Brain: Examination degraded by motion artifact. Generalized age-related cerebral atrophy. Multiple scattered foci of diffusion abnormality seen involving the cortical and subcortical aspect of the left cerebral hemisphere, with involvement of the left insula and overlying left frontal parietal cortex. Minimal patchy involvement of the left basal ganglia and subinsular white matter. Associated minimal petechial hemorrhage at the left insula  without hemorrhagic transformation. 1.4 cm focus of susceptibility artifact at the left cerebellum, consistent with a small intraparenchymal hemorrhage, corresponding with abnormality on prior CT (series 18, image 14). Finding could reflect a hemorrhage or possibly hemorrhagic infarction. In additional smaller 5 mm hemorrhage seen at the contralateral right cerebellum (series 18, image 11). Mild localized  edema without significant regional mass effect. No other evidence for acute or chronic intracranial hemorrhage. No other mass lesion, midline shift or mass effect. No hydrocephalus or extra-axial fluid collection. Pituitary gland suprasellar region within normal limits. Midline structures intact. Vascular: Major intracranial vascular flow voids are maintained. Skull and upper cervical spine: Craniocervical junction within normal limits. Normal bone marrow signal intensity. No focal marrow replacing lesion. No scalp soft tissue abnormality. Sinuses/Orbits: Globes and orbital soft tissues within normal limits. Paranasal sinuses are clear. No significant mastoid effusion. Inner ear structures grossly normal. Other: None. MRA HEAD FINDINGS ANTERIOR CIRCULATION: Visualized distal cervical segments of the internal carotid arteries are patent with antegrade flow. Petrous, cavernous, and supraclinoid segments patent without stenosis. 3 mm focal outpouching arising from the supraclinoid right ICA could reflect a small vascular infundibulum related to a hypoplastic right PCOM versus small aneurysm (series 9, image 88). A1 segments patent bilaterally. Normal anterior communicating artery complex. Anterior cerebral arteries patent bilaterally. No M1 stenosis or occlusion. Normal MCA bifurcations. Distal right MCA branches well perfuse. On the left, susceptibility artifact is seen at the level of the right sylvian fissure, suspected to correspond with a distal right M3 occlusion, faintly seen on corresponding MRA (series 1070,  image 18). Remainder of the distal left MCA branches perfused. POSTERIOR CIRCULATION: Visualized vertebral arteries widely patent to the vertebrobasilar junction. Both PICA origins patent and normal. Basilar patent to its distal aspect without stenosis. Superior cerebellar arteries patent bilaterally. 2 mm focal outpouching at the origin of the right SCA favored to reflect a small vascular infundibulum. Right PCA supplied via the basilar. Left PCA supplied via the basilar as well as a prominent left posterior communicating artery. Both PCAs well perfused to their distal aspects. IMPRESSION: MRI HEAD IMPRESSION: 1. 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 Associated faint petechial hemorrhage at the left insula without hemorrhagic transformation or significant mass effect. 2. 1.4 cm acute intraparenchymal hemorrhage at the left cerebellum, corresponding with abnormality on prior CT. This could reflect a cerebellar hemorrhage or possibly hemorrhagic infarction. Additional smaller 5 mm hemorrhage at the contralateral right cerebellum. Clinical follow-up to resolution recommended to ensure no underlying lesions are present at these locations. MRA HEAD IMPRESSION: 1. Proximal left M3 occlusion, in keeping with the acute left MCA distribution infarct. 2. Otherwise wide patency of the major intracranial arterial circulation. No other proximal high-grade or correctable stenosis. 3. 3 mm outpouching arising from the supraclinoid right ICA, favored to reflect a small vascular infundibulum, although possible aneurysm difficult to exclude. Tension at follow-up recommended. Please note that an MRA neck was ordered for this exam, but was unable to be completed as the patient was unable to tolerate the full length of the study. No tract to should be applied. Electronically Signed   By: Jeannine Boga M.D.   On: 11/23/2020 20:22   MR BRAIN WO CONTRAST  Result Date: 11/16/2020 CLINICAL DATA:   Follow-up examination for acute stroke. EXAM: MRI HEAD WITHOUT CONTRAST MRA HEAD WITHOUT CONTRAST TECHNIQUE: Multiplanar, multiecho pulse sequences of the brain and surrounding structures were obtained without intravenous contrast. Angiographic images of the head were obtained using MRA technique without contrast. COMPARISON:  Prior CT from earlier the same day. FINDINGS: MRI HEAD FINDINGS Brain: Examination degraded by motion artifact. Generalized age-related cerebral atrophy. Multiple scattered foci of diffusion abnormality seen involving the cortical and subcortical aspect of the left cerebral hemisphere, with involvement of the left insula and overlying left frontal parietal cortex.  Minimal patchy involvement of the left basal ganglia and subinsular white matter. Associated minimal petechial hemorrhage at the left insula without hemorrhagic transformation. 1.4 cm focus of susceptibility artifact at the left cerebellum, consistent with a small intraparenchymal hemorrhage, corresponding with abnormality on prior CT (series 18, image 14). Finding could reflect a hemorrhage or possibly hemorrhagic infarction. In additional smaller 5 mm hemorrhage seen at the contralateral right cerebellum (series 18, image 11). Mild localized edema without significant regional mass effect. No other evidence for acute or chronic intracranial hemorrhage. No other mass lesion, midline shift or mass effect. No hydrocephalus or extra-axial fluid collection. Pituitary gland suprasellar region within normal limits. Midline structures intact. Vascular: Major intracranial vascular flow voids are maintained. Skull and upper cervical spine: Craniocervical junction within normal limits. Normal bone marrow signal intensity. No focal marrow replacing lesion. No scalp soft tissue abnormality. Sinuses/Orbits: Globes and orbital soft tissues within normal limits. Paranasal sinuses are clear. No significant mastoid effusion. Inner ear structures  grossly normal. Other: None. MRA HEAD FINDINGS ANTERIOR CIRCULATION: Visualized distal cervical segments of the internal carotid arteries are patent with antegrade flow. Petrous, cavernous, and supraclinoid segments patent without stenosis. 3 mm focal outpouching arising from the supraclinoid right ICA could reflect a small vascular infundibulum related to a hypoplastic right PCOM versus small aneurysm (series 9, image 88). A1 segments patent bilaterally. Normal anterior communicating artery complex. Anterior cerebral arteries patent bilaterally. No M1 stenosis or occlusion. Normal MCA bifurcations. Distal right MCA branches well perfuse. On the left, susceptibility artifact is seen at the level of the right sylvian fissure, suspected to correspond with a distal right M3 occlusion, faintly seen on corresponding MRA (series 1070, image 18). Remainder of the distal left MCA branches perfused. POSTERIOR CIRCULATION: Visualized vertebral arteries widely patent to the vertebrobasilar junction. Both PICA origins patent and normal. Basilar patent to its distal aspect without stenosis. Superior cerebellar arteries patent bilaterally. 2 mm focal outpouching at the origin of the right SCA favored to reflect a small vascular infundibulum. Right PCA supplied via the basilar. Left PCA supplied via the basilar as well as a prominent left posterior communicating artery. Both PCAs well perfused to their distal aspects. IMPRESSION: MRI HEAD IMPRESSION: 1. 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 Associated faint petechial hemorrhage at the left insula without hemorrhagic transformation or significant mass effect. 2. 1.4 cm acute intraparenchymal hemorrhage at the left cerebellum, corresponding with abnormality on prior CT. This could reflect a cerebellar hemorrhage or possibly hemorrhagic infarction. Additional smaller 5 mm hemorrhage at the contralateral right cerebellum. Clinical follow-up to resolution  recommended to ensure no underlying lesions are present at these locations. MRA HEAD IMPRESSION: 1. Proximal left M3 occlusion, in keeping with the acute left MCA distribution infarct. 2. Otherwise wide patency of the major intracranial arterial circulation. No other proximal high-grade or correctable stenosis. 3. 3 mm outpouching arising from the supraclinoid right ICA, favored to reflect a small vascular infundibulum, although possible aneurysm difficult to exclude. Tension at follow-up recommended. Please note that an MRA neck was ordered for this exam, but was unable to be completed as the patient was unable to tolerate the full length of the study. No tract to should be applied. Electronically Signed   By: Jeannine Boga M.D.   On: 11/27/2020 20:22   MR BRAIN W CONTRAST  Result Date: Dec 11, 2020 CLINICAL DATA:  Follow-up examination for stroke. EXAM: MRI HEAD WITH CONTRAST TECHNIQUE: Multiplanar, multiecho pulse sequences of the brain and surrounding  structures were obtained with intravenous contrast. CONTRAST:  77mL GADAVIST GADOBUTROL 1 MMOL/ML IV SOLN COMPARISON:  Prior MRI from earlier the same day. FINDINGS: Previously identified left cerebellar hemorrhage demonstrates ring enhancement, measuring 1.4 cm. Additional right cerebellar lesion also demonstrates ring enhancement, measuring 7 mm (series 6, image 14). There are a few additional subtle small ring-enhancing lesions at the right occipital lobe (series 7, images 7, 3). Largest of these additional lesions measures 7 mm. There is an additional 5 mm lesion at the high left frontal convexity (series 7, image 17). Probable additional punctate cortical lesion at the anterior left frontal lobe (series 9, image 21). Given the presence of multiple lesions, findings most consistent with intracranial metastatic disease, hemorrhagic in nature given previous exams. Mild edema about the cerebellar lesions. No other significant edema or regional mass effect.  No other convincing pathologic enhancement on this motion degraded exam. IMPRESSION: Multiple ring-enhancing lesions involving the bilateral cerebellar hemispheres, right occipital lobe, and high left frontal lobe as above, consistent with intracranial metastatic disease. Lesions at the cerebellum are hemorrhagic in nature given findings on prior exams. Dedicated metastatic workup recommended if no primary is known at this time. Electronically Signed   By: Jeannine Boga M.D.   On: 12/10/20 00:19   DG Chest Portable 1 View  Result Date: 11/03/2020 CLINICAL DATA:  Altered mental status, cough EXAM: PORTABLE CHEST 1 VIEW COMPARISON:  11/18/2020 FINDINGS: Heart is normal size. Layering right pleural effusion. Right lower lobe airspace opacity increasing since prior study and obscuring the previously seen right lower lobe mass. Left base atelectasis. There is hyperinflation of the lungs compatible with COPD. No acute bony abnormality. IMPRESSION: COPD. Layering right pleural effusion. Increasing right lower lobe airspace opacity concerning for pneumonia. Electronically Signed   By: Rolm Baptise M.D.   On: 11/03/2020 20:34   CT HEAD CODE STROKE WO CONTRAST  Result Date: 11/23/2020 CLINICAL DATA:  Code stroke.  Right-sided weakness EXAM: CT HEAD WITHOUT CONTRAST TECHNIQUE: Contiguous axial images were obtained from the base of the skull through the vertex without intravenous contrast. COMPARISON:  None. FINDINGS: Brain: Brainstem appears normal. There appears to be an abnormal hyperdensity in the left cerebellum measuring approximately 1 cm in size. This could represent a cerebellar hemorrhage, hemorrhagic infarction or possibly a cavernoma. Cerebral hemispheres appear normal. No atrophy. No sign of acute infarction. No mass, hydrocephalus or extra-axial collection. Vascular: No abnormal vascular finding Skull: Normal Sinuses/Orbits: Clear/normal Other: None ASPECTS ([place] Stroke Program Early CT Score)  - Ganglionic level infarction (caudate, lentiform nuclei, internal capsule, insula, M1-M3 cortex): 7 - Supraganglionic infarction (M4-M6 cortex): 3 Total score (0-10 with 10 being normal): 10 IMPRESSION: 1. 1 cm hyperdensity in the left cerebellum. This could represent a cerebellar hemorrhage, hemorrhagic infarction or possibly a cavernoma. 2. ASPECTS is 10. 3. These results were communicated to Dr. Quinn Axe At 6:45 pmon 03/03/2022by telephone discussion. Electronically Signed   By: Nelson Chimes M.D.   On: 11/03/2020 18:50    Procedures Procedures   Medications Ordered in ED Medications  sodium chloride 0.9 % bolus 500 mL (0 mLs Intravenous Hold 12/10/20 0231)  oxyCODONE (Oxy IR/ROXICODONE) immediate release tablet 5 mg (has no administration in time range)  senna-docusate (Senokot-S) tablet 1 tablet (has no administration in time range)  loratadine (CLARITIN) tablet 10 mg (has no administration in time range)  lip balm (CARMEX) ointment 1 application (has no administration in time range)  hydrocortisone (ANUSOL-HC) 2.5 % rectal cream 1 application (has no  administration in time range)  alum & mag hydroxide-simeth (MAALOX/MYLANTA) 200-200-20 MG/5ML suspension 30 mL (has no administration in time range)  hydrocortisone cream 1 % 1 application (has no administration in time range)  Muscle Rub CREA 1 application (has no administration in time range)  sodium chloride (OCEAN) 0.65 % nasal spray 1 spray (has no administration in time range)  phenol (CHLORASEPTIC) mouth spray 1 spray (has no administration in time range)  magic mouthwash (has no administration in time range)  acetaminophen (TYLENOL) tablet 650 mg (has no administration in time range)    Or  acetaminophen (TYLENOL) suppository 650 mg (has no administration in time range)  HYDROmorphone (DILAUDID) injection 0.5 mg (has no administration in time range)  morphine CONCENTRATE 10 MG/0.5ML oral solution 5 mg (has no administration in time  range)    Or  morphine CONCENTRATE 10 MG/0.5ML oral solution 5 mg (has no administration in time range)  traZODone (DESYREL) tablet 25 mg (has no administration in time range)  LORazepam (ATIVAN) tablet 1 mg ( Oral See Alternative 11-29-20 1112)    Or  LORazepam (ATIVAN) 2 MG/ML concentrated solution 1 mg ( Sublingual See Alternative November 29, 2020 1112)    Or  LORazepam (ATIVAN) injection 1 mg (1 mg Intravenous Given 11-29-2020 1112)  haloperidol (HALDOL) tablet 0.5 mg (has no administration in time range)    Or  haloperidol (HALDOL) 2 MG/ML solution 0.5 mg (has no administration in time range)    Or  haloperidol lactate (HALDOL) injection 0.5 mg (has no administration in time range)  senna (SENOKOT) tablet 8.6 mg (has no administration in time range)  loperamide (IMODIUM) capsule 2 mg (has no administration in time range)  ondansetron (ZOFRAN-ODT) disintegrating tablet 4 mg (has no administration in time range)    Or  ondansetron (ZOFRAN) injection 4 mg (has no administration in time range)  atropine 1 % ophthalmic solution 4 drop (has no administration in time range)  baclofen (LIORESAL) tablet 5 mg (has no administration in time range)  LORazepam (ATIVAN) injection 1 mg (has no administration in time range)  albuterol (PROVENTIL) (2.5 MG/3ML) 0.083% nebulizer solution 2.5 mg (has no administration in time range)  antiseptic oral rinse (BIOTENE) solution 15 mL (has no administration in time range)  polyvinyl alcohol (LIQUIFILM TEARS) 1.4 % ophthalmic solution 1 drop (has no administration in time range)  sodium chloride flush (NS) 0.9 % injection 3 mL (3 mLs Intravenous Given 11/07/2020 2116)  sodium chloride 0.9 % bolus 1,000 mL (0 mLs Intravenous Stopped 11/25/2020 2116)  LORazepam (ATIVAN) injection 1 mg (1 mg Intravenous Given 11/19/2020 2112)  LORazepam (ATIVAN) injection 1 mg (1 mg Intravenous Given 11/13/2020 2236)  gadobutrol (GADAVIST) 1 MMOL/ML injection 6 mL (6 mLs Intravenous Contrast Given  11/19/2020 2326)  vancomycin (VANCOREADY) IVPB 1250 mg/250 mL (0 mg Intravenous Stopped November 29, 2020 0521)   stroke: mapping our early stages of recovery book ( Does not apply Given 11-29-20 0300)    ED Course  I have reviewed the triage vital signs and the nursing notes.  Pertinent labs & imaging results that were available during my care of the patient were reviewed by me and considered in my medical decision making (see chart for details).  60 yo male w/ stage IV lung cancer presenting with right sided weakness onset today.  Presented as code stroke.  CTH & MR imaging concerning for possible hemorrhagic metastasis.  Labs reviewed CMP shows worsening Cr since discharge 2 days ago, BUN elevated- very likely due to dehydration  as patient is unwilling to eat or drink.  Additional hx provided by his wife at bedside Prior records reviewed including hospitalization most recently  Ativan given for anxiolysis and claustrophobia with MRI IV fluids ordered for dehydration  Xray chest with opacity and effusion which may very likely be primary lung cancer.  The patient is cachetic and quite ill appearing.  I suspect there is a very poor prognosis from his advanced metastatic disease.    Clinical Course as of 2020/12/02 1224  Fri Nov 20, 2020  2050 Likely hemorrhagic mets seen on MRI scan.  Neurologist recommending medical admission, needs repeat MRI brain with contrast.  Patient and his wife updated.  They are both requesting anxiolysis for him, I will give ativan 1 mg. [MT]    Clinical Course User Index [MT] Waneta Fitting, Carola Rhine, MD    Final Clinical Impression(s) / ED Diagnoses Final diagnoses:  Metastatic cancer to brain [facility])  Right sided weakness    Rx / DC Orders ED Discharge Orders    None       Wyvonnia Dusky, MD 12/02/2020 1224

## 2020-11-20 NOTE — Code Documentation (Signed)
Stroke Response Nurse Documentation Code Documentation  [facility] is a 60 y.o. male arriving to [facility] via [facility] on 11/17/2020 with past medical hx of atrial fibrillation, stage IV lung cancer. Code stroke was activated by EMS. Patient from home where he was LKW at [phone] and now complaining of right sided weakness and unequal pupils. Pt was using the bathroom when he developed weakness to his right side. On No antithrombotic. Stroke team at the bedside on patient arrival. Labs drawn. Patient to CT with team. NIHSS 8, see documentation for details and code stroke times. Patient with right facial droop, right arm weakness, right leg weakness and right limb ataxia on exam. The following imaging was completed: CT, MRI. Patient is not a candidate for tPA due to MRI findings.  Plan:  q2h NIHSS and VS Bedside handoff with ED RN [facility].  [facility]  Rapid Response RN

## 2020-11-20 NOTE — Progress Notes (Signed)
[place] [phone]   [phone]  CONSULT NOTE  REFERRING PHYSICIAN: Nita Sells, MD  REASON FOR CONSULTATION:  60 years old African-American male recently diagnosed with metastatic adenocarcinoma.  HPI Brian Leonard is a 60 y.o. male with past medical history significant for asthma, cervical spine surgery as well as long history of smoking.  The patient presented to the emergency department on November 09, 2020 complaining of back pain as well as productive cough and mild shortness of breath.  Chest x-ray on 11/09/2020 showed soft tissue fullness in the right hilum.  This was followed by CT scan of the chest, abdomen and pelvis with contrast on 11/10/2020.  The scan showed extensive mediastinal, hilar and supraclavicular adenopathy throughout the chest bilaterally.  Some central hypoattenuation concerning necrotic adenopathy.  The largest supraclavicular nodal deposit noted on the right measured up to 3.1 x 3.0 cm in size.  Paratracheal node measuring up to 3.3 x 3.2 cm.  There was conglomerate adenopathy noted in the AP window and prevascular space as well.  There was no acute abnormality of the tracheobronchial esophagus.  The scan also showed centrally hypoattenuating lobular mass lesion seen in the posterior superior segment right lower lobe measuring approximately 2.3 x 2.4 cm in size.  Additional smaller nodules are seen throughout both lungs.  The scan also showed heterogeneous larger hypoattenuating focus in the tail of the pancreas measuring 2.5 x 2.1 cm and indeterminate hypoattenuation focus in the posterior right lobe liver measuring 1.3 x 1.3 cm and possible flash filling hemangioma.  The scan showed lucent lesion involving the T9 anterior inferior vertebral body with some mild soft tissue extension with additional lucent lesion in the posterior left ninth rib and some ill-defined sclerotic changes in the right seventh rib laterally.  The scan  also showed intermediate attenuation pericardial effusion possibly malignant.  On November 10, 2020 the patient underwent subxiphoid pericardial window under the care of Dr. Cyndia Bent.  The final pathology ([id number]) showed metastatic adenocarcinoma. Immunohistochemical stains show the tumor cells are positive for CK7  while they are negative for TTF-1, Napsin-A, CK 5/6 and CK20. The immunoprofile is nonspecific. Differential diagnosis can include a lung, upper gastrointestinal and pancreatobiliary primary. The patient was just discharged from the hospital yesterday and he came today for evaluation and recommendation regarding his condition. When seen today he continues to complain of increasing fatigue and weakness as well as the sternal chest and back pain.  He also has cough and shortness of breath at baseline increased with exertion but his oxygen saturation was 98%.  He has intermittent nausea and vomiting as well as abdominal pain but no diarrhea or constipation.  He has drop of his right upper eyelid.  His wife also mentions that he has delusion and he sees people that no one else sees.  He lost several pounds recently. Family history significant for mother who is deceased from heart disease.  Father had asthma.  Brother had colon cancer and died at age 75. The patient is married and has 1 son.  He works as a Sports coach and also he does a lot of work of [employer].  He was accompanied by his wife Vickii Chafe.  He has a history of smoking less than 1 pack/day for around 48 years.  He quit smoking and alcohol on November 10, 2020.  He has a history of marijuana use as well as cocaine use in the past but not recently.   HPI  Past Medical History:  Diagnosis Date  . Asthma     Past Surgical History:  Procedure Laterality Date  . CERVICAL SPINE SURGERY    . PERICARDIAL WINDOW N/A 11/10/2020   Procedure: PERICARDIAL WINDOW;  Surgeon: Gaye Pollack, MD;  Location: [facility] OR;  Service: Thoracic;  Laterality:  N/A;    No family history on file.  Social History Social History   Tobacco Use  . Smoking status: Current Every Day Smoker  . Smokeless tobacco: Never Used  Substance Use Topics  . Alcohol use: Yes  . Drug use: Yes    Types: Marijuana    Allergies  Allergen Reactions  . Iodine Anaphylaxis  . Shellfish Allergy Anaphylaxis  . Other Hives and Swelling    Reaction to unknown oral asthma medication (pt has taken Theo Dur in the past - 10 yrs ago with no reaction)    Current Outpatient Medications  Medication Sig Dispense Refill  . amiodarone (PACERONE) 200 MG tablet Take 1 tablet (200 mg total) by mouth every 12 (twelve) hours for 2 days. 4 tablet 0  . [START ON 2020/12/17] amiodarone (PACERONE) 200 MG tablet Take 1 tablet (200 mg total) by mouth daily. 30 tablet 0  . diphenhydrAMINE (BENADRYL) 25 mg capsule Take 1 capsule (25 mg total) by mouth at bedtime as needed for sleep. 30 capsule 0  . guaiFENesin (ROBITUSSIN) 100 MG/5ML SOLN Take 10 mLs (200 mg total) by mouth every 4 (four) hours as needed for cough or to loosen phlegm. 236 mL 0  . HYDROmorphone HCl (DILAUDID) 1 MG/ML LIQD Take 1 mL (1 mg total) by mouth every 3 (three) hours as needed for severe pain. 56 mL 0  . levalbuterol (XOPENEX HFA) 45 MCG/ACT inhaler Inhale 2 puffs into the lungs every 2 (two) hours as needed for wheezing. 1 each 12  . Lidocaine 0.5 % GEL Apply 1 each topically 3 (three) times daily. 170 g 0  . Magnesium Oxide 400 (240 Mg) MG TABS Take 1 tablet (400 mg total) by mouth in the morning and at bedtime. 60 tablet 0  . metoprolol tartrate (LOPRESSOR) 25 MG tablet Take 1 tablet (25 mg total) by mouth 2 (two) times daily. 60 tablet 1  . Multiple Vitamin (MULTIVITAMIN WITH MINERALS) TABS tablet Take 1 tablet by mouth daily.    . nicotine (NICODERM CQ - DOSED IN MG/24 HOURS) 14 mg/24hr patch Place 1 patch (14 mg total) onto the skin daily. 28 patch 0  . oxyCODONE (OXYCONTIN) 15 mg 12 hr tablet Take 1 tablet  (15 mg total) by mouth every 12 (twelve) hours. 14 tablet 0  . pantoprazole (PROTONIX) 40 MG tablet Take 1 tablet (40 mg total) by mouth 2 (two) times daily. 30 tablet 0  . polyethylene glycol (MIRALAX / GLYCOLAX) 17 g packet Take 17 g by mouth daily. 14 each 0  . senna-docusate (SENOKOT-S) 8.6-50 MG tablet Take 1 tablet by mouth 2 (two) times daily. 30 tablet 0  . traMADol (ULTRAM) 50 MG tablet Take 1-2 tablets (50-100 mg total) by mouth every 6 (six) hours as needed (mild pain). 30 tablet 0   No current facility-administered medications for this visit.    Review of Systems  Constitutional: positive for anorexia, fatigue and weight loss Eyes: negative Ears, nose, mouth, throat, and face: negative Respiratory: positive for cough, dyspnea on exertion and pleurisy/chest pain Cardiovascular: negative Gastrointestinal: positive for abdominal pain, nausea and vomiting Genitourinary:negative Integument/breast: negative Hematologic/lymphatic: negative Musculoskeletal:positive for back pain and bone  pain Neurological: negative Behavioral/Psych: negative Endocrine: negative Allergic/Immunologic: negative  Physical Exam  KPT:WSFKC, healthy, no distress, anxious, ill looking and malnourished SKIN: skin color, texture, turgor are normal HEAD: Normocephalic, No masses, lesions, tenderness or abnormalities EYES: normal, PERRLA, Conjunctiva are pink and non-injected EARS: External ears normal, Canals clear OROPHARYNX:no exudate, no erythema and lips, buccal mucosa, and tongue normal  NECK: supple, no adenopathy, no JVD LYMPH:  no palpable lymphadenopathy, no hepatosplenomegaly LUNGS: clear to auscultation , and palpation HEART: regular rate & rhythm, no murmurs and no gallops ABDOMEN:abdomen soft, non-tender, normal bowel sounds and no masses or organomegaly BACK: No CVA tenderness, Range of motion is normal EXTREMITIES:no joint deformities, effusion, or inflammation, no edema  NEURO: alert  & oriented x 3 with fluent speech, no focal motor/sensory deficits  PERFORMANCE STATUS: ECOG 2  LABORATORY DATA: Lab Results  Component Value Date   WBC 12.7 (H) 11/19/2020   HGB 11.3 (L) 11/19/2020   HCT 31.8 (L) 11/19/2020   MCV 92.7 11/19/2020   PLT 341 11/19/2020      Chemistry      Component Value Date/Time   NA 132 (L) 11/19/2020 0037   K 4.4 11/19/2020 0037   CL 96 (L) 11/19/2020 0037   CO2 25 11/19/2020 0037   BUN 12 11/19/2020 0037   CREATININE 0.87 11/19/2020 0037      Component Value Date/Time   CALCIUM 9.7 11/19/2020 0037   ALKPHOS 105 11/19/2020 0037   AST 18 11/19/2020 0037   ALT 17 11/19/2020 0037   BILITOT 0.9 11/19/2020 0037       RADIOGRAPHIC STUDIES: DG Chest 2 View  Result Date: 11/18/2020 CLINICAL DATA:  Cough and shortness of breath. EXAM: CHEST - 2 VIEW COMPARISON:  Chest x-ray from yesterday. FINDINGS: Normal heart size. Unchanged right hilar and upper mediastinal lymphadenopathy. The lungs remain hyperinflated with emphysematous changes. Unchanged small right greater than left pleural effusions with bibasilar atelectasis. IMPRESSION: 1. Unchanged small right greater than left pleural effusions with bibasilar atelectasis. 2. Unchanged thoracic lymphadenopathy. 3. COPD. Electronically Signed   By: Titus Dubin M.D.   On: 11/18/2020 10:59   DG Chest 2 View  Result Date: 11/17/2020 CLINICAL DATA:  Pericardial drain removed. EXAM: CHEST - 2 VIEW COMPARISON:  11/16/2020 and multiple previous FINDINGS: Pericardial drain is been removed. Lead artifacts overlie the mediastinum and heart. Small left effusion with left lower lobe volume loss. This is slightly improved. Small to moderate size right effusion with right lower lung volume loss, also improved. Emphysema and scarring again noted at both lung apices. IMPRESSION: Interval removal of pericardial drain. Slight improvement in small left effusion and left lower lobe volume loss. Small to moderate right  effusion with right lower lung volume loss, also slightly improved. Electronically Signed   By: Nelson Chimes M.D.   On: 11/17/2020 08:47   DG Chest 2 View  Result Date: 11/09/2020 CLINICAL DATA:  Back pain and productive cough. EXAM: CHEST - 2 VIEW COMPARISON:  None. FINDINGS: Lungs are hyperexpanded. Soft tissue fullness noted in the right hilum. Left lung unremarkable. No pleural effusion. The visualized bony structures of the thorax show no acute abnormality. IMPRESSION: Soft tissue fullness in the right hilum. CT chest with contrast recommended to further evaluate as lymphadenopathy/neoplasm a concern. Electronically Signed   By: Misty Stanley M.D.   On: 11/09/2020 20:21   DG Abd 1 View  Result Date: 11/19/2020 CLINICAL DATA:  Abdomen pain EXAM: ABDOMEN - 1 VIEW COMPARISON:  CT  11/10/2020 FINDINGS: Small bilateral pleural effusions. Nonobstructed gas pattern with residual contrast in the right colon. No radiopaque calculi. IMPRESSION: Nonobstructed gas pattern. Electronically Signed   By: Donavan Foil M.D.   On: 11/19/2020 00:02   CT Chest W Contrast  Addendum Date: 11/10/2020   ADDENDUM REPORT: 11/10/2020 01:06 ADDENDUM: Additional small centrally necrotic appearing deposits appear to be located either along or adjacent the right hemidiaphragm, either within the lung base or subphrenic space (3/150). Addendum was called by telephone at the time of interpretation on 11/10/2020 at 1:06 am to provider GARRETT GREEN , who verbally acknowledged these results. Electronically Signed   By: Lovena Le M.D.   On: 11/10/2020 01:06   Result Date: 11/10/2020 CLINICAL DATA:  Abnormal radiograph, concern for mass EXAM: CT CHEST WITH CONTRAST TECHNIQUE: Multidetector CT imaging of the chest was performed during intravenous contrast administration. CONTRAST:  78m OMNIPAQUE IOHEXOL 300 MG/ML  SOLN COMPARISON:  Radiograph 11/09/2020 FINDINGS: Cardiovascular: Normal cardiac size. Intermediate attenuation pericardial  effusion, possibly malignant given the bulky mediastinal and hilar nodal disease. The aortic root is suboptimally assessed given cardiac pulsation artifact. The aorta is normal caliber. Aberrant right subclavian artery with small diverticulum of Kommerell. Proximal great vessels are otherwise unremarkable. Central pulmonary arteries are top-normal caliber. No large central or lobar filling defects within limitations of this non tailored examination of the pulmonary arteries. Reflux of contrast into the IVC and hepatic veins. Hypoattenuating filling defect is seen extending from the within the left brachiocephalic vein 32/64-15 clear this reflects direct extension or merely thrombus in the setting of compression by the bulky disease Mediastinum/Nodes: Extensive mediastinal, hilar and supraclavicular adenopathy throughout the chest bilaterally. Some central hypoattenuation concerning necrotic adenopathy. Largest supraclavicular nodal deposit noted on the right measuring up to 3.1 x 3 cm in size (3/23). Paratracheal node measuring 3.3 x 1.9 cm (3/70). Right hilar deposit measuring 3.3 x 3.2 cm (3/90). Conglomerate adenopathy noted in the AP window and prevascular space as well. No acute abnormality of the trachea or esophagus. Hypoattenuating nodule in the thyroid isthmus measuring up to 1.7 cm in size. Lungs/Pleura: Centrally hypoattenuating lobular mass lesion seen in the posterosuperior segment right lower lobe measuring approximately 2.3 x 2.4 cm in size (4/93). Additional smaller nodules are seen throughout both lungs. Findings are on a background of diffuse centrilobular and paraseptal emphysematous changes as well as bronchitic features with airways thickening and scattered secretions. Regions of bandlike scarring and/or atelectasis are noted throughout both lungs. Trace right effusion with adjacent atelectasis. No left effusion. Upper Abdomen: Heterogeneous, largely hypoattenuating focus in the tail of the  pancreas measuring 2.5 x 2.1 cm (3/21). Indeterminate hyperattenuating focus in the posterior right lobe liver measuring 1.3 x 1.3 cm (3/182), possible flash filling hemangioma though should be viewed with conspicuity given additional findings. No other acute abnormality in the upper abdomen. Musculoskeletal: Lucent lesion seen involving the T9 anterior inferior vertebral body with some mild soft tissue extension. Additional lucent lesion in the posterior left ninth rib (3/114) some ill defined sclerotic change in the right seventh rib laterally. No other consider acute or conspicuous osseous lesions. IMPRESSION: 1. 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 Differential considerations could include a primary pulmonary malignancy with widespread metastases versus metastatic disease from a partially visualized pancreatic  tail lesion. Alternatively, atypical infection such as mycobacterial/tuberculosis should be excluded as well. 2. Trace right pleural effusion. 3. Reflux of contrast in the hepatic veins and IVC could reflect some elevated right heart pressures/right-sided dysfunction. Correlate with echocardiogram particularly given the presence pericardial effusion. 4. Hypoattenuating filling defect is seen in the left brachiocephalic vein, unclear if this could reflect extension from the nodal disease or merely bland thrombus with compression of the left brachiocephalic vein as it passes through the extensive prevascular deposits. No large central or lobar filling defects are seen within the pulmonary arteries though evaluation limited on this non tailored examination. 5. Lucent lesion involving the T9 anterior inferior vertebral body  with some mild soft tissue extension. Additional lucent lesion in the posterior left ninth rib. Additional ill defined sclerotic change in the right seventh rib laterally. 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 6. Indeterminate hyperattenuating focus in the posterior right lobe liver measuring 1.3 x 1.3 cm, possible flash filling hemangioma though should be viewed with conspicuity given additional findings. 7. Hypoattenuating 1.7 cm nodule in the thyroid isthmus, Recommend thyroid US (ref: J Am Coll Radiol. 2015 Feb;12(2): 143-50). 8. Aberrant right subclavian artery. 9. Emphysema (ICD10-J43.9). These results were called by telephone at the time of interpretation on 11/10/2020 at 12:49 am to provider PA McDonald, who verbally acknowledged these results. Electronically Signed: By: Lovena Le M.D. On: 11/10/2020 00:51   CT ABDOMEN PELVIS W CONTRAST  Result Date: 11/10/2020 CLINICAL DATA:  Metastatic disease evaluation EXAM: CT ABDOMEN AND PELVIS WITH CONTRAST TECHNIQUE: Multidetector CT imaging of the abdomen and pelvis was performed using the standard protocol following bolus administration of intravenous contrast. CONTRAST:  166m OMNIPAQUE IOHEXOL 300 MG/ML  SOLN COMPARISON:  Same-day CT chest, 11/10/2020 FINDINGS: Lower chest: Small right pleural effusion, increased compared to prior examination. Large pericardial effusion, similar to prior. Hepatobiliary: No solid liver abnormality is seen. Periportal edema. Pericholecystic fluid, nonspecific in the setting of ascites. No gallstones, gallbladder wall thickening, or biliary dilatation. Pancreas: Unremarkable. No pancreatic ductal dilatation or surrounding inflammatory changes. Spleen: Normal in size without significant abnormality. Adrenals/Urinary Tract: Adrenal glands are unremarkable. Kidneys are normal, without renal calculi, solid lesion, or hydronephrosis. Bladder is unremarkable. Stomach/Bowel: Stomach is within normal limits. Appendix  appears normal. No evidence of bowel wall thickening, distention, or inflammatory changes. Vascular/Lymphatic: Aortic atherosclerosis. No enlarged abdominal or pelvic lymph nodes. Reproductive: No mass or other significant abnormality. Other: No abdominal wall hernia or abnormality. Small volume ascites throughout the abdomen and pelvis. Musculoskeletal: Small lucent and rim sclerotic lesions noted in the pelvis, for example in the right ilium (series 3, image 54, 57). IMPRESSION: 1. No CT evidence of lymphadenopathy soft tissue metastatic disease in the abdomen or pelvis. 2. Small lucent and rim sclerotic lesions noted in the bony pelvis, suspicious for subtle osseous metastatic disease given the presence of a presumed metastatic lesion of the T9 vertebral body. PET-CT is more sensitive for the detection of subtle metabolically active osseous metastatic disease. 3. Small volume ascites throughout the abdomen and pelvis, nonspecific. 4. Periportal edema and pericholecystic fluid, nonspecific in the setting of ascites. 5. Small right pleural effusion, increased compared to prior examination. 6. Large pericardial effusion, similar to prior examination. Aortic Atherosclerosis (ICD10-I70.0). Electronically Signed   By: AEddie CandleM.D.   On: 11/10/2020 12:46   DG Chest Port 1 View  Result Date: 11/16/2020 CLINICAL DATA:  Pericardial effusion.  Chest pain EXAM: PORTABLE CHEST 1 VIEW COMPARISON:  November 14, 2020 chest radiograph and chest CT FINDINGS: Pericardial drain in place. The heart size and pulmonary vascularity appear within normal limits. There are pleural effusions bilaterally with bibasilar atelectasis. Fullness in the right paratracheal region is stable, consistent with adenopathy noted on recent CT. There is a degree of right hilar and azygos region adenopathy as well. No bone lesions. Postoperative change lower cervical region. IMPRESSION: Pericardial drain in place.  Heart size normal. Pleural effusions  bilaterally with bibasilar atelectasis. Areas of adenopathy in the right paratracheal, azygos, and right hilar regions seen by radiography. Adenopathy in other areas better seen on CT. Electronically Signed   By: Lowella Grip III M.D.   On: 11/16/2020 07:59   DG CHEST PORT 1 VIEW  Result Date: 11/14/2020 CLINICAL DATA:  pain, chest pain and cough.  Tobacco use. EXAM: PORTABLE CHEST 1 VIEW COMPARISON:  Chest x-ray 11/13/2020, CT chest 11/10/2020, CT chest 11/10/2020 FINDINGS: Hyperinflation with cystic changes consistent with emphysema. The heart size and mediastinal contours are unchanged. Persistent right hilar prominence. Bilateral trace to small volume pleural effusions, likely right greater than left. Right lower lobe opacity. No pneumothorax. No acute osseous abnormality. IMPRESSION: 1. Similar findings with persistent right lower lung zone opacity as well as bilateral trace to small volume pleural effusions, likely right greater than left. 2.  Emphysema (ICD10-J43.9). Electronically Signed   By: Iven Finn M.D.   On: 11/14/2020 06:59   DG CHEST PORT 1 VIEW  Result Date: 11/13/2020 CLINICAL DATA:  Pericardial effusion EXAM: PORTABLE CHEST 1 VIEW COMPARISON:  November 12, 2020 FINDINGS: Apparent pericardial drain present, unchanged in position. Heart size within normal limits. Pulmonary vascularity within normal limits. There are pleural effusions bilaterally with consolidation in the left lower lobe. There is apparent bullous disease in the left apex region. No adenopathy evident. Postoperative change lower cervical region. IMPRESSION: Apparent pericardial drain present. Heart size within normal limits. Pleural effusions bilaterally. Left lower lobe consolidation may in part be due to atelectasis; a degree of superimposed pneumonia in the left lower lobe cannot be excluded. Bullous disease noted left apex. Electronically Signed   By: Lowella Grip III M.D.   On: 11/13/2020 08:32   DG CHEST  PORT 1 VIEW  Result Date: 11/12/2020 CLINICAL DATA:  Exudative pericardial effusion status post pericardial window EXAM: PORTABLE CHEST 1 VIEW COMPARISON:  11/11/2020 FINDINGS: Right basilar consolidation and associated small right pleural effusion is stable. Mild interval improvement in interstitial infiltrate within the right upper lobe. No typical biapical cicatricial change or cavitary lesions identified to suggest granulomatous infection. Small left basilar pleural effusion is again noted. No pneumothorax. Cardiac size within normal limits. Right hilar enlargement again seen in keeping with right hilar adenopathy better seen on prior CT examination. IMPRESSION: Minimally improved right lung pulmonary infiltrate. Right basilar consolidation and associated small right pleural effusion again noted. Small left pleural effusion present. Enlargement of the right hilum related to adenopathy unchanged. Electronically Signed   By: Fidela Salisbury MD   On: 11/12/2020 06:29   DG CHEST PORT 1 VIEW  Result Date: 11/11/2020 CLINICAL DATA:  Hemoptysis.  Pericardial effusion. EXAM: PORTABLE CHEST 1 VIEW COMPARISON:  11/10/2020.  CT 11/10/2020. FINDINGS: Mediastinum and right hilar fullness again noted consistent with previously visualized adenopathy. Right base infiltrate again noted. Reference is made to recent chest CT report for discussion of extensive findings throughout the chest. No prominent pleural effusion. No pneumothorax. Pericardial drain in stable position. Heart size stable. No pulmonary venous congestion. Prior  cervical spine fusion. Reference is made to chest CT report for discussion of bone lesions present. IMPRESSION: 1. Mediastinal and right hilar fullness again noted consistent with previously visualized adenopathy. Right base infiltrate again noted. No interim change. 2. Pericardial drain in stable position. No pneumothorax. Heart size stable. No pulmonary venous congestion. Chest is unchanged from  prior exam. Reference is made to recent chest CT report for discussion of extensive findings throughout the chest. Electronically Signed   By: Marcello Moores  Register   On: 11/11/2020 08:29   DG Chest Port 1 View  Result Date: 11/10/2020 CLINICAL DATA:  Pericardial effusion status post drainage. EXAM: PORTABLE CHEST 1 VIEW COMPARISON:  CT chest from same day.  Chest x-ray from yesterday. FINDINGS: New pericardial drain. Normal heart size. Normal pulmonary vascularity. Unchanged right paratracheal and hilar fullness related to underlying lymphadenopathy. Emphysematous changes again noted. New patchy airspace disease at the right lung base. No pleural effusion or pneumothorax. IMPRESSION: 1. New pericardial drain. 2. New patchy airspace disease at the right lung base, suspicious for aspiration. 3. Unchanged mediastinal and right hilar lymphadenopathy. Electronically Signed   By: Titus Dubin M.D.   On: 11/10/2020 20:54   ECHOCARDIOGRAM COMPLETE  Result Date: 11/10/2020    ECHOCARDIOGRAM REPORT   Patient Name:   GREY RAKESTRAW Date of Exam: 11/10/2020 Medical Rec #:  [id number]      Height:       71.0 in Accession #:    [id number]     Weight:       136.0 lb Date of Birth:  [birth year]      BSA:          1.790 m Patient Age:    62 years       BP:           112/79 mmHg Patient Gender: M              HR:           110 bpm. Exam Location:  Inpatient Procedure: 2D Echo Indications:    pericardial effusion  History:        Patient has no prior history of Echocardiogram examinations.                 Signs/Symptoms:Shortness of Breath.  Sonographer:    Johny Chess Referring Phys: [id number] [place]  1. Left ventricular ejection fraction, by estimation, is 55 to 60%. The left ventricle has normal function. The left ventricle has no regional wall motion abnormalities. Indeterminate diastolic filling due to E-A fusion.  2. Right ventricular systolic function is normal. The right ventricular size is normal. The  estimated right ventricular systolic pressure is 93.7 mmHg.  3. Moderate pericardial effusion. The pericardial effusion is circumferential. Findings are consistent with cardiac tamponade.  4. The mitral valve is normal in structure. No evidence of mitral valve regurgitation.  5. The aortic valve is tricuspid. Aortic valve regurgitation is not visualized. No aortic stenosis is present.  6. The inferior vena cava is dilated in size with <50% respiratory variability, suggesting right atrial pressure of 15 mmHg. FINDINGS  Left Ventricle: Left ventricular ejection fraction, by estimation, is 55 to 60%. The left ventricle has normal function. The left ventricle has no regional wall motion abnormalities. The left ventricular internal cavity size was normal in size. There is  no left ventricular hypertrophy. Indeterminate diastolic filling due to E-A fusion. Right Ventricle: The right ventricular size is normal. No increase in right ventricular wall  thickness. Right ventricular systolic function is normal. The tricuspid regurgitant velocity is 1.57 m/s, and with an assumed right atrial pressure of 15 mmHg, the estimated right ventricular systolic pressure is 16.1 mmHg. Left Atrium: Left atrial size was normal in size. Right Atrium: Right atrial size was normal in size. Pericardium: A moderately sized pericardial effusion is present. The pericardial effusion is circumferential. There is excessive respiratory variation in septal movement, diastolic collapse of the right ventricular free wall, diastolic collapse of the right atrial wall, excessive respiratory variation in the mitral valve spectral Doppler velocities and excessive respiratory variation in the tricuspid valve spectral Doppler velocities. There is evidence of cardiac tamponade. Mitral Valve: The mitral valve is normal in structure. No evidence of mitral valve regurgitation. Tricuspid Valve: The tricuspid valve is normal in structure. Tricuspid valve regurgitation  is not demonstrated. Aortic Valve: The aortic valve is tricuspid. Aortic valve regurgitation is not visualized. No aortic stenosis is present. Pulmonic Valve: The pulmonic valve was normal in structure. Pulmonic valve regurgitation is not visualized. Aorta: The aortic root is normal in size and structure. Venous: The inferior vena cava is dilated in size with less than 50% respiratory variability, suggesting right atrial pressure of 15 mmHg. IAS/Shunts: No atrial level shunt detected by color flow Doppler.  LEFT VENTRICLE PLAX 2D LVIDd:         4.20 cm Diastology LVIDs:         2.15 cm LV e' medial:    7.83 cm/s LV PW:         1.00 cm LV E/e' medial:  4.5 LV IVS:        0.90 cm LV e' lateral:   7.51 cm/s                        LV E/e' lateral: 4.7  RIGHT VENTRICLE             IVC RV S prime:     26.80 cm/s  IVC diam: 2.70 cm TAPSE (M-mode): 2.3 cm LEFT ATRIUM             Index       RIGHT ATRIUM          Index LA diam:        2.20 cm 1.23 cm/m  RA Area:     7.14 cm LA Vol (A2C):   16.3 ml 9.11 ml/m  RA Volume:   12.30 ml 6.87 ml/m LA Vol (A4C):   18.3 ml 10.22 ml/m LA Biplane Vol: 19.1 ml 10.67 ml/m  AORTIC VALVE LVOT Vmax:   66.60 cm/s LVOT Vmean:  44.300 cm/s LVOT VTI:    0.089 m  AORTA Ao Asc diam: 3.30 cm MV E velocity: 35.10 cm/s  TRICUSPID VALVE MV A velocity: 57.80 cm/s  TR Peak grad:   9.9 mmHg MV E/A ratio:  0.61        TR Vmax:        157.46 cm/s                             SHUNTS                            Systemic VTI: 0.09 m Dani Gobble Croitoru MD Electronically signed by Sanda Klein MD Signature Date/Time: 11/10/2020/6:07:36 PM    Final     ASSESSMENT: This is a very pleasant  60 years old African-American male recently diagnosed with metastatic adenocarcinoma (T1c, N3, 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   PLAN: I had a lengthy discussion with the patient and his wife today about his current disease stage, prognosis and treatment options. I personally and independently reviewed the scan images and discussed the results with the patient and his wife today. This is likely lung cancer but also a pancreatic cancer could not be completely excluded at this point. I recommended for the patient to complete the staging work-up by ordering a PET scan as well as CT scan of the head with and without contrast to rule out any other metastatic disease and also to identify the primary etiology of his malignancy. We will [facility] for molecular studies and PD-L1 expression.  We will also send blood test to [facility] for molecular studies because of the urgency of his situation. For the persistent chest and back pain, the patient is currently on treatment with OxyContin 15 mg p.o. twice daily in addition to Dilaudid for breakthrough pain.  I will refer the patient to radiation oncology for consideration of palliative radiotherapy to the painful lesion. I will also refer the patient to the dietitian at the cancer center because of his malnutrition. We will also refer the patient to the social worker at the cancer center to help with his social status and insurance coverage. I will arrange for the patient to come back for follow-up visit in less than 2 weeks for reevaluation and more detailed discussion of his treatment options based on the final staging work-up and molecular studies. I discussed with the patient and his wife his treatment options and they understand that he has incurable condition and all the treatment will be of palliative nature. He was giving the option of palliative care and hospice referral versus consideration of palliative systemic chemotherapy with carboplatin, Alimta  and Keytruda if he has no actionable mutation or treatment with targeted therapy if the patient has an actionable mutation on the molecular studies. We will discuss these options in more details with his upcoming visit but the patient is interested in some form of treatment. He was advised to call immediately if he has any other concerning symptoms in the interval.  The patient voices understanding of current disease status and treatment options and is in agreement with the current care plan.  All questions were answered. The patient knows to call the clinic with any problems, questions or concerns. We can certainly see the patient much sooner if necessary.  Thank you so much for allowing me to participate in the care of Brian Leonard. I will continue to follow up the patient with you and assist in his care.  The total time spent in the appointment was 90 minutes.  Disclaimer: This note was dictated with voice recognition software. Similar sounding words can inadvertently be transcribed and may not be corrected upon review.   Eilleen Kempf November 20, 2020, 8:59 AM

## 2020-11-20 NOTE — Progress Notes (Signed)
[facility] CSW Progress Note  Referral received for newly diagnosed Stage IV lung cancer patient with no insurance.  Requested help from [facility] is looking into his situation and will help as possible.  [facility] CSW team will follow also.   Edwyna Shell, LCSW Clinical Social Worker Phone:  [phone]

## 2020-11-20 NOTE — Progress Notes (Signed)
Oncology Nurse Navigator Documentation  Oncology Nurse Navigator Flowsheets 11/14/2020  Abnormal Finding Date 11/09/2020  Confirmed Diagnosis Date 11/10/2020  Diagnosis Status Pending Molecular Studies  Planned Course of Treatment Chemotherapy;Radiation;Targeted Therapy  Phase of Treatment Chemo  Navigator Follow Up Date: 11/23/2020  Navigator Follow Up Reason: Appointment Review  Navigator Location [facility]  Referral Date to RadOnc/MedOnc 11/16/2020  Navigator Encounter Type Clinic/MDC/I spoke with Brian Leonard and his wife today at clinic. I help to explain treatment plan of care. I organized him to get [facility] blood test. I completed referral to dietition and CSW.  Patient has lost a lot of weight recently and does not have any insurance.   Patient Visit Type Initial;MedOnc  Treatment Phase Pre-Tx/Tx Discussion  Barriers/Navigation Needs Education;Coordination of Care;Health Literacy;No Insurance  Education Newly Diagnosed Cancer Education;Other  Interventions Coordination of Care;Education;Psycho-Social Support;Referrals  Acuity Level 3-Moderate Needs (3-4 Barriers Identified)  Referrals Nutrition/dietician;Social Work  Coordination of Care Other  Education Method Verbal;Written  Time Spent with Patient 72

## 2020-11-21 ENCOUNTER — Other Ambulatory Visit: Payer: Self-pay

## 2020-11-21 ENCOUNTER — Inpatient Hospital Stay (HOSPITAL_COMMUNITY): Payer: Medicaid Other

## 2020-11-21 ENCOUNTER — Other Ambulatory Visit (HOSPITAL_COMMUNITY): Payer: Self-pay

## 2020-11-21 DIAGNOSIS — I639 Cerebral infarction, unspecified: Secondary | ICD-10-CM

## 2020-11-21 DIAGNOSIS — E46 Unspecified protein-calorie malnutrition: Secondary | ICD-10-CM

## 2020-11-21 DIAGNOSIS — C799 Secondary malignant neoplasm of unspecified site: Secondary | ICD-10-CM

## 2020-11-21 DIAGNOSIS — I480 Paroxysmal atrial fibrillation: Secondary | ICD-10-CM

## 2020-11-21 DIAGNOSIS — I313 Pericardial effusion (noninflammatory): Secondary | ICD-10-CM

## 2020-11-21 DIAGNOSIS — C7931 Secondary malignant neoplasm of brain: Secondary | ICD-10-CM

## 2020-11-21 DIAGNOSIS — I82290 Acute embolism and thrombosis of other thoracic veins: Secondary | ICD-10-CM

## 2020-11-21 DIAGNOSIS — C3491 Malignant neoplasm of unspecified part of right bronchus or lung: Secondary | ICD-10-CM

## 2020-11-21 DIAGNOSIS — I6340 Cerebral infarction due to embolism of unspecified cerebral artery: Secondary | ICD-10-CM

## 2020-11-21 DIAGNOSIS — C7989 Secondary malignant neoplasm of other specified sites: Secondary | ICD-10-CM

## 2020-11-21 LAB — TROPONIN I (HIGH SENSITIVITY)
Troponin I (High Sensitivity): 90 ng/L — ABNORMAL HIGH (ref <18)
Troponin I (High Sensitivity): 174 ng/L (ref <18)

## 2020-11-21 LAB — CBC
HCT: 29.2 % — ABNORMAL LOW (ref 39.0–52.0)
Hemoglobin: 9.8 g/dL — ABNORMAL LOW (ref 13.0–17.0)
MCH: 32.1 pg (ref 26.0–34.0)
MCHC: 33.6 g/dL (ref 30.0–36.0)
MCV: 95.7 fL (ref 80.0–100.0)
Platelets: 208 10*3/uL (ref 150–400)
RBC: 3.05 MIL/uL — ABNORMAL LOW (ref 4.22–5.81)
RDW: 13.5 % (ref 11.5–15.5)
WBC: 19 10*3/uL — ABNORMAL HIGH (ref 4.0–10.5)
nRBC: 0 % (ref 0.0–0.2)

## 2020-11-21 LAB — LIPID PANEL
Cholesterol: 150 mg/dL (ref 0–200)
HDL: 42 mg/dL (ref >40)
LDL Cholesterol: 88 mg/dL (ref 0–99)
Total CHOL/HDL Ratio: 3.6 RATIO
Triglycerides: 99 mg/dL (ref <150)
VLDL: 20 mg/dL (ref 0–40)

## 2020-11-21 LAB — HIV ANTIBODY (ROUTINE TESTING W REFLEX): HIV Screen 4th Generation wRfx: NONREACTIVE

## 2020-11-21 LAB — HEMOGLOBIN A1C
Hgb A1c MFr Bld: 6 % — ABNORMAL HIGH (ref 4.8–5.6)
Mean Plasma Glucose: 125.5 mg/dL

## 2020-11-21 LAB — D-DIMER, QUANTITATIVE: D-Dimer, Quant: >20 ug/mL-FEU — ABNORMAL HIGH (ref 0.00–0.50)

## 2020-11-21 LAB — BRAIN NATRIURETIC PEPTIDE: B Natriuretic Peptide: 564.8 pg/mL — ABNORMAL HIGH (ref 0.0–100.0)

## 2020-11-21 LAB — SARS CORONAVIRUS 2 (TAT 6-24 HRS): SARS Coronavirus 2: NEGATIVE

## 2020-11-21 LAB — LACTIC ACID, PLASMA: Lactic Acid, Venous: 5.2 mmol/L (ref 0.5–1.9)

## 2020-11-21 MED ORDER — ONDANSETRON 4 MG PO TBDP: 4.0000 mg | ORAL_TABLET | Freq: Four times a day (QID) | ORAL | Status: DC | PRN

## 2020-11-21 MED ORDER — HALOPERIDOL LACTATE 5 MG/ML IJ SOLN: 0.5000 mg | INTRAMUSCULAR | Status: DC | PRN

## 2020-11-21 MED ORDER — ACETAMINOPHEN 650 MG RE SUPP: 650.0000 mg | RECTAL | Status: DC | PRN

## 2020-11-21 MED ORDER — ACETAMINOPHEN 325 MG PO TABS: 650.0000 mg | ORAL_TABLET | Freq: Four times a day (QID) | ORAL | Status: DC | PRN

## 2020-11-21 MED ORDER — POLYVINYL ALCOHOL 1.4 % OP SOLN
1.0000 [drp] | Freq: Four times a day (QID) | OPHTHALMIC | Status: DC | PRN
  Filled 2020-11-21: qty 15

## 2020-11-21 MED ORDER — MAGIC MOUTHWASH
15.0000 mL | Freq: Four times a day (QID) | ORAL | Status: DC | PRN
  Filled 2020-11-21: qty 15

## 2020-11-21 MED ORDER — MORPHINE SULFATE (CONCENTRATE) 10 MG/0.5ML PO SOLN: 5.0000 mg | ORAL | Status: DC | PRN

## 2020-11-21 MED ORDER — HALOPERIDOL 0.5 MG PO TABS: 0.5000 mg | ORAL_TABLET | ORAL | Status: DC | PRN

## 2020-11-21 MED ORDER — ONDANSETRON HCL 4 MG/2ML IJ SOLN: 4.0000 mg | Freq: Four times a day (QID) | INTRAMUSCULAR | Status: DC | PRN

## 2020-11-21 MED ORDER — STROKE: EARLY STAGES OF RECOVERY BOOK
Freq: Once | Status: AC
  Filled 2020-11-21: qty 1

## 2020-11-21 MED ORDER — LORAZEPAM 2 MG/ML IJ SOLN
1.0000 mg | INTRAMUSCULAR | Status: DC | PRN
  Administered 2020-11-21: 1 mg via INTRAVENOUS
  Filled 2020-11-21: qty 1

## 2020-11-21 MED ORDER — OXYCODONE HCL 5 MG PO TABS: 5.0000 mg | ORAL_TABLET | ORAL | Status: DC | PRN

## 2020-11-21 MED ORDER — ALBUTEROL SULFATE (2.5 MG/3ML) 0.083% IN NEBU: 2.5000 mg | INHALATION_SOLUTION | RESPIRATORY_TRACT | Status: DC | PRN

## 2020-11-21 MED ORDER — POLYVINYL ALCOHOL 1.4 % OP SOLN: 1.0000 [drp] | OPHTHALMIC | Status: DC | PRN

## 2020-11-21 MED ORDER — SALINE SPRAY 0.65 % NA SOLN: 1.0000 | NASAL | Status: DC | PRN

## 2020-11-21 MED ORDER — VANCOMYCIN HCL 1250 MG/250ML IV SOLN
1250.0000 mg | Freq: Once | INTRAVENOUS | Status: AC
  Administered 2020-11-21: 1250 mg via INTRAVENOUS
  Filled 2020-11-21: qty 250

## 2020-11-21 MED ORDER — ACETAMINOPHEN 160 MG/5ML PO SOLN: 650.0000 mg | ORAL | Status: DC | PRN

## 2020-11-21 MED ORDER — MUSCLE RUB 10-15 % EX CREA: 1.0000 "application " | TOPICAL_CREAM | CUTANEOUS | Status: DC | PRN

## 2020-11-21 MED ORDER — SENNA 8.6 MG PO TABS: 1.0000 | ORAL_TABLET | Freq: Every evening | ORAL | Status: DC | PRN

## 2020-11-21 MED ORDER — BIOTENE DRY MOUTH MT LIQD: 15.0000 mL | OROMUCOSAL | Status: DC | PRN

## 2020-11-21 MED ORDER — HYDROCORTISONE 1 % EX CREA: 1.0000 "application " | TOPICAL_CREAM | Freq: Three times a day (TID) | CUTANEOUS | Status: DC | PRN

## 2020-11-21 MED ORDER — ACETAMINOPHEN 650 MG RE SUPP: 650.0000 mg | Freq: Four times a day (QID) | RECTAL | Status: DC | PRN

## 2020-11-21 MED ORDER — LORAZEPAM 1 MG PO TABS: 1.0000 mg | ORAL_TABLET | ORAL | Status: DC | PRN

## 2020-11-21 MED ORDER — LORAZEPAM 2 MG/ML PO CONC: 1.0000 mg | ORAL | Status: DC | PRN

## 2020-11-21 MED ORDER — LIP MEDEX EX OINT: 1.0000 "application " | TOPICAL_OINTMENT | CUTANEOUS | Status: DC | PRN

## 2020-11-21 MED ORDER — SODIUM CHLORIDE 0.9 % IV SOLN
2.0000 g | Freq: Two times a day (BID) | INTRAVENOUS | Status: DC
  Administered 2020-11-21: 2 g via INTRAVENOUS
  Filled 2020-11-21: qty 2

## 2020-11-21 MED ORDER — AMIODARONE HCL 200 MG PO TABS: 200.0000 mg | ORAL_TABLET | Freq: Every day | ORAL | Status: DC

## 2020-11-21 MED ORDER — DM-GUAIFENESIN ER 30-600 MG PO TB12
1.0000 | ORAL_TABLET | Freq: Two times a day (BID) | ORAL | Status: DC | PRN
  Filled 2020-11-21: qty 1

## 2020-11-21 MED ORDER — HYDROMORPHONE HCL 1 MG/ML IJ SOLN: 0.5000 mg | INTRAMUSCULAR | Status: DC | PRN

## 2020-11-21 MED ORDER — ALUM & MAG HYDROXIDE-SIMETH 200-200-20 MG/5ML PO SUSP: 30.0000 mL | ORAL | Status: DC | PRN

## 2020-11-21 MED ORDER — SODIUM CHLORIDE 0.9 % IV BOLUS: 500.0000 mL | Freq: Once | INTRAVENOUS | Status: DC

## 2020-11-21 MED ORDER — HYDROCORTISONE (PERIANAL) 2.5 % EX CREA: 1.0000 "application " | TOPICAL_CREAM | Freq: Four times a day (QID) | CUTANEOUS | Status: DC | PRN

## 2020-11-21 MED ORDER — ATROPINE SULFATE 1 % OP SOLN
4.0000 [drp] | OPHTHALMIC | Status: DC | PRN
  Filled 2020-11-21: qty 2

## 2020-11-21 MED ORDER — MORPHINE 100MG IN NS 100ML (1MG/ML) PREMIX INFUSION
1.0000 mg/h | INTRAVENOUS | Status: DC
  Administered 2020-11-21: 2 mg/h via INTRAVENOUS
  Filled 2020-11-21: qty 100

## 2020-11-21 MED ORDER — ACETAMINOPHEN 325 MG PO TABS: 650.0000 mg | ORAL_TABLET | ORAL | Status: DC | PRN

## 2020-11-21 MED ORDER — VANCOMYCIN HCL 1000 MG/200ML IV SOLN: 1000.0000 mg | INTRAVENOUS | Status: DC

## 2020-11-21 MED ORDER — PHENOL 1.4 % MT LIQD: 1.0000 | OROMUCOSAL | Status: DC | PRN

## 2020-11-21 MED ORDER — HALOPERIDOL LACTATE 2 MG/ML PO CONC
0.5000 mg | ORAL | Status: DC | PRN
  Filled 2020-11-21: qty 0.3

## 2020-11-21 MED ORDER — LORAZEPAM 2 MG/ML IJ SOLN: 1.0000 mg | INTRAMUSCULAR | Status: DC | PRN

## 2020-11-21 MED ORDER — TRAZODONE HCL 50 MG PO TABS: 25.0000 mg | ORAL_TABLET | Freq: Every evening | ORAL | Status: DC | PRN

## 2020-11-21 MED ORDER — IPRATROPIUM-ALBUTEROL 0.5-2.5 (3) MG/3ML IN SOLN: 3.0000 mL | RESPIRATORY_TRACT | Status: DC | PRN

## 2020-11-21 MED ORDER — SENNOSIDES-DOCUSATE SODIUM 8.6-50 MG PO TABS: 1.0000 | ORAL_TABLET | Freq: Every evening | ORAL | Status: DC | PRN

## 2020-11-21 MED ORDER — BACLOFEN 10 MG PO TABS: 5.0000 mg | ORAL_TABLET | Freq: Two times a day (BID) | ORAL | Status: DC | PRN

## 2020-11-21 MED ORDER — LOPERAMIDE HCL 2 MG PO CAPS: 2.0000 mg | ORAL_CAPSULE | ORAL | Status: DC | PRN

## 2020-11-21 MED ORDER — LORATADINE 10 MG PO TABS: 10.0000 mg | ORAL_TABLET | Freq: Every day | ORAL | Status: DC | PRN

## 2020-11-24 ENCOUNTER — Encounter: Payer: Self-pay | Admitting: *Deleted

## 2020-11-24 NOTE — Progress Notes (Signed)
I called [facility] to cancel molecular testing.  I spoke with representatives and they cancelled testing.

## 2020-11-25 ENCOUNTER — Ambulatory Visit: Payer: Self-pay | Admitting: Surgery

## 2020-11-26 LAB — CULTURE, BLOOD (ROUTINE X 2)
Culture: NO GROWTH
Culture: NO GROWTH
Special Requests: ADEQUATE
Special Requests: ADEQUATE

## 2020-11-27 ENCOUNTER — Other Ambulatory Visit (HOSPITAL_COMMUNITY): Payer: Self-pay

## 2020-12-02 ENCOUNTER — Other Ambulatory Visit (HOSPITAL_COMMUNITY): Payer: Self-pay

## 2020-12-04 NOTE — Progress Notes (Signed)
Received request to meet with family re advanced directives and prognosis. Note patient's oncologist Dr Julien Nordmann will not be back until 03/21. Attempted to call wife on phone but directed to voicemail.  I will be rounding at [facility] shortly and will stop by the ED to meet with family. Agree with palliative consult and given evidence of hemorrhagic brain metastases in this patient with incurable stage IV lung cancer and A fib recommend no resuscitation in case of a terminal event

## 2020-12-04 NOTE — ED Notes (Signed)
Dr Marlowe Sax at bedside

## 2020-12-04 NOTE — Progress Notes (Signed)
PROGRESS NOTE    Brian Leonard  XAV:[id number] DOB: Mar 17, 1961 DOA: 11/28/2020 PCP: Patient, No Pcp Per   Brief Narrative:  60 year old with history of diagnostic metastatic lung cancer, new onset A. fib not on AC, asthma came to the ER with code stroke for evaluation of right-sided arm and leg weakness and facial droop.  Admitted here from 3/7-3/17/2022 for stage IV lung cancer with necrotic appearing pneumonia and sepsis, malignant pericardial effusion with tamponade status post drainage by CT surgery on 3/8 complicated by A. fib with RVR treated with amiodarone.  He was started on anticoagulation with bloody drainage.  Pericardial drain was discontinued 3/14.  Chest x-ray showed COPD with increasing right lower lobe opacity, CT head showed 1 cm hypodensity in left cerebellum with cerebellar hemorrhage, hemorrhagic infarction.  MRI brain showed patchy acute ischemic left MCA infarct associated with petechial hemorrhage.  MRI brain with contrast showed multiple ring-enhancing lesion concerning for metastatic disease. Oncology was consulted, transitioned to comfort care.    Assessment & Plan:   Principal Problem:   CVA (cerebral vascular accident) (HCC) Active Problems:   Pericardial effusion   Brachiocephalic vein thrombosis (HCC)   PAF (paroxysmal atrial fibrillation) (HCC)   Adenocarcinoma of right lung, stage 4 (HCC)  Patient has been transitioned to comfort care  Acute hemorrhagic CVA, intracranial metastatic disease -CT head shows 1 cm hypodensity in the left cerebellum with cerebellar hemorrhage.  MRI brain without contrast shows acute ischemic left MCA territory CVA with intraparenchymal and bowel left cerebellum hemorrhage.  MRA head shows left M3 occlusion with wide patency of intracranial circulation.  MRI brain with contrast showing metastatic brain brain disease with ring-enhancing lesions -Neurology consulted  Sepsis secondary to right lower lobe pneumonia Acute hypoxic  respiratory failure -Patient has persistent pneumonia in the right lower lobe likely necrotic given extensive malignancy -Discontinue antibiotics  Elevated troponin -EKG showing ST elevation in inferior leads.  Troponins are trending upwards.  Unable to anticoagulate the patient.  Echocardiogram has been ordered  Metastatic adenocarcinoma of the lung -Seen by oncology.  Transition to comfort care  Malignant pericardial effusion -Recently drained by CT surgery.  Drain removed 3/14.  Due to hemorrhagic in nature, anticoagulation was discontinued  Paroxysmal atrial fibrillation -Currently on amiodarone and beta-blocker -Not a candidate for anticoagulation  Brachiocephalic thrombus -due to bleeding risk not on PPx  Hyponatremia AKI -Likely prerenal in nature  Goal of Care discussion -Goals of care discussed, transitioned to comfort care.    DVT prophylaxis: Comfort care Code Status: DNR Family Communication:  Wife at bedside    Subjective: Patient is nonverbal looking around and tracking me across the room but does not participate in conversation. I met with the patient's wife at bedside, after extensive discussion and poor prognosis, patient transition to comfort care   Examination:  Constitutional: Appears chronically ill Respiratory: Clear to auscultation bilaterally Cardiovascular: Normal sinus rhythm, no rubs Abdomen: Nontender nondistended good bowel sounds Musculoskeletal: No edema noted Skin: No rashes seen Neurologic: Nonverbal difficult to assess Psychiatric: Nonverbal difficult to assess Objective: Vitals:   12-08-20 0045 2020-12-08 0100 Dec 08, 2020 0117 December 08, 2020 0600  BP:  94/82 94/82 99/75   Pulse:   (!) 128 (!) 126  Resp: (!) 29 (!) 27 18 (!) 28  Temp:      TempSrc:      SpO2:   92% 92%    Intake/Output Summary (Last 24 hours) at 12/08/20 0726 Last data filed at 12/08/20 0332 Gross per 24 hour  Intake 1101.43  ml  Output --  Net 1101.43 ml    There were no vitals filed for this visit.   Data Reviewed:   CBC: Recent Labs  Lab 11/18/20 0023 11/19/20 0037 11/25/2020 0850 11/25/2020 1828 11/03/2020 1833 03-Dec-2020 0432  WBC 9.9 12.7* 17.6* 17.9*  --  19.0*  NEUTROABS  --  10.7* 15.5* 15.6*  --   --   HGB 11.1* 11.3* 11.1* 10.5* 10.9* 9.8*  HCT 31.7* 31.8* 32.4* 30.4* 32.0* 29.2*  MCV 94.3 92.7 92.6 94.7  --  95.7  PLT 371 341 290 262  --  825   Basic Metabolic Panel: Recent Labs  Lab 11/17/20 0110 11/18/20 0023 11/18/20 1931 11/19/20 0037 11/22/2020 0850 11/07/2020 1828 11/24/2020 1833  NA 132* 132*  --  132* 131* 129* 130*  K 3.7 4.3  --  4.4 4.4 4.6 4.6  CL 97* 95*  --  96* 96* 93* 93*  CO2 27 28  --  _0 --   GLUCOSE 113* 108*  --  124* 140* 165* 159*  BUN 10 11  --  12 18 24* 32*  CREATININE 0.88 0.89  --  0.87 1.06 1.52* 1.30*  CALCIUM 9.0 9.3  --  9.7 10.0 9.7  --   MG  --   --  1.9 1.8  --   --   --    GFR: Estimated Creatinine Clearance: 52.5 mL/min (A) (by C-G formula based on SCr of 1.3 mg/dL (H)). Liver Function Tests: Recent Labs  Lab 11/19/20 0037 11/30/2020 0850 11/16/2020 1828  AST 18 17 34  ALT _1 ALKPHOS 105 140* 199*  BILITOT 0.9 0.7 0.8  PROT 5.8* 6.6 5.8*  ALBUMIN 2.9* 3.1* 2.9*   No results for input(s): LIPASE, AMYLASE in the last 168 hours. No results for input(s): AMMONIA in the last 168 hours. Coagulation Profile: Recent Labs  Lab 11/28/2020 1828  INR 1.5*   Cardiac Enzymes: No results for input(s): CKTOTAL, CKMB, CKMBINDEX, TROPONINI in the last 168 hours. BNP (last 3 results) No results for input(s): PROBNP in the last 8760 hours. HbA1C: Recent Labs    12/03/20 0500  HGBA1C 6.0*   CBG: Recent Labs  Lab 11/18/20 1109 11/18/20 1539 11/18/20 2133 11/19/20 0601 11/22/2020 1825  GLUCAP 109* 117* 122* 122* 153*   Lipid Profile: No results for input(s): CHOL, HDL, LDLCALC, TRIG, CHOLHDL, LDLDIRECT in the last 72 hours. Thyroid Function Tests: No results for  input(s): TSH, T4TOTAL, FREET4, T3FREE, THYROIDAB in the last 72 hours. Anemia Panel: No results for input(s): VITAMINB12, FOLATE, FERRITIN, TIBC, IRON, RETICCTPCT in the last 72 hours. Sepsis Labs: Recent Labs  Lab 03-Dec-2020 0037  LATICACIDVEN 5.2*    Recent Results (from the past 240 hour(s))  Culture, blood (routine x 2)     Status: None (Preliminary result)   Collection Time: 2020/12/03  2:00 AM   Specimen: BLOOD  Result Value Ref Range Status   Specimen Description BLOOD SITE NOT SPECIFIED  Final   Special Requests   Final    BOTTLES DRAWN AEROBIC AND ANAEROBIC Blood Culture adequate volume Performed at [facility], [place] [address], [place], [place] [address]    Culture PENDING  Incomplete   Report Status PENDING  Incomplete  Culture, blood (routine x 2)     Status: None (Preliminary result)   Collection Time: 12/03/2020  2:43 AM   Specimen: BLOOD  Result Value Ref Range Status   Specimen Description BLOOD SITE NOT  SPECIFIED  Final   Special Requests   Final    BOTTLES DRAWN AEROBIC AND ANAEROBIC Blood Culture adequate volume Performed at [facility], [place] [address] [place], [place] [address]    Culture PENDING  Incomplete   Report Status PENDING  Incomplete         Radiology Studies: CT HEAD WO CONTRAST  Result Date: 12/03/2020 CLINICAL DATA:  60 year old male with multiple ring-enhancing cerebellar lesions, some hemorrhagic on MRI suspicious for metastatic disease. Superimposed patchy left MCA infarct on MRI. Increasing right side weakness. EXAM: CT HEAD WITHOUT CONTRAST TECHNIQUE: Contiguous axial images were obtained from the base of the skull through the vertex without intravenous contrast. COMPARISON:  Brain MRI 11/27/2020 and earlier. FINDINGS: Brain: Small left cerebellar hyperdense lesion seems less discrete since the CT on 11/26/2020 (series 4, image 9 today). No significant posterior fossa mass effect or cerebellar edema at this time. Cytotoxic edema  is now evident at the left insula (series 4, image 15) and appears larger from the recent DWI. Cortical edema also evident in the lateral perirolandic area series 4, image 21. No hemorrhage or mass effect associated with these areas. Elsewhere stable gray-white matter differentiation. No midline shift. No ventriculomegaly. No new intracranial hemorrhage identified. Vascular: Mild Calcified atherosclerosis at the skull base. No suspicious intracranial vascular hyperdensity. Skull: No acute or suspicious osseous lesion identified. Sinuses/Orbits: Visualized paranasal sinuses and mastoids are stable and well pneumatized. Other: Stable orbit and scalp soft tissues. Prominent superior ophthalmic veins again noted. IMPRESSION: 1. Mild expansion of the left MCA territory ischemia at the insula since 11/30/2020. No hemorrhagic transformation or intracranial mass effect. 2. Left cerebellar hyperdense lesion appears mildly improved by CT since 11/30/2020. No new intracranial lesion or blood products identified. Electronically Signed   By: Genevie Ann M.D.   On: Dec 03, 2020 04:39   MR ANGIO HEAD WO CONTRAST  Result Date: 11/16/2020 CLINICAL DATA:  Follow-up examination for acute stroke. EXAM: MRI HEAD WITHOUT CONTRAST MRA HEAD WITHOUT CONTRAST TECHNIQUE: Multiplanar, multiecho pulse sequences of the brain and surrounding structures were obtained without intravenous contrast. Angiographic images of the head were obtained using MRA technique without contrast. COMPARISON:  Prior CT from earlier the same day. FINDINGS: MRI HEAD FINDINGS Brain: Examination degraded by motion artifact. Generalized age-related cerebral atrophy. Multiple scattered foci of diffusion abnormality seen involving the cortical and subcortical aspect of the left cerebral hemisphere, with involvement of the left insula and overlying left frontal parietal cortex. Minimal patchy involvement of the left basal ganglia and subinsular white matter. Associated  minimal petechial hemorrhage at the left insula without hemorrhagic transformation. 1.4 cm focus of susceptibility artifact at the left cerebellum, consistent with a small intraparenchymal hemorrhage, corresponding with abnormality on prior CT (series 18, image 14). Finding could reflect a hemorrhage or possibly hemorrhagic infarction. In additional smaller 5 mm hemorrhage seen at the contralateral right cerebellum (series 18, image 11). Mild localized edema without significant regional mass effect. No other evidence for acute or chronic intracranial hemorrhage. No other mass lesion, midline shift or mass effect. No hydrocephalus or extra-axial fluid collection. Pituitary gland suprasellar region within normal limits. Midline structures intact. Vascular: Major intracranial vascular flow voids are maintained. Skull and upper cervical spine: Craniocervical junction within normal limits. Normal bone marrow signal intensity. No focal marrow replacing lesion. No scalp soft tissue abnormality. Sinuses/Orbits: Globes and orbital soft tissues within normal limits. Paranasal sinuses are clear. No significant mastoid effusion. Inner ear structures grossly normal. Other: None.  MRA HEAD FINDINGS ANTERIOR CIRCULATION: Visualized distal cervical segments of the internal carotid arteries are patent with antegrade flow. Petrous, cavernous, and supraclinoid segments patent without stenosis. 3 mm focal outpouching arising from the supraclinoid right ICA could reflect a small vascular infundibulum related to a hypoplastic right PCOM versus small aneurysm (series 9, image 88). A1 segments patent bilaterally. Normal anterior communicating artery complex. Anterior cerebral arteries patent bilaterally. No M1 stenosis or occlusion. Normal MCA bifurcations. Distal right MCA branches well perfuse. On the left, susceptibility artifact is seen at the level of the right sylvian fissure, suspected to correspond with a distal right M3 occlusion,  faintly seen on corresponding MRA (series 1070, image 18). Remainder of the distal left MCA branches perfused. POSTERIOR CIRCULATION: Visualized vertebral arteries widely patent to the vertebrobasilar junction. Both PICA origins patent and normal. Basilar patent to its distal aspect without stenosis. Superior cerebellar arteries patent bilaterally. 2 mm focal outpouching at the origin of the right SCA favored to reflect a small vascular infundibulum. Right PCA supplied via the basilar. Left PCA supplied via the basilar as well as a prominent left posterior communicating artery. Both PCAs well perfused to their distal aspects. IMPRESSION: MRI HEAD IMPRESSION: 1. 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 Associated faint petechial hemorrhage at the left insula without hemorrhagic transformation or significant mass effect. 2. 1.4 cm acute intraparenchymal hemorrhage at the left cerebellum, corresponding with abnormality on prior CT. This could reflect a cerebellar hemorrhage or possibly hemorrhagic infarction. Additional smaller 5 mm hemorrhage at the contralateral right cerebellum. Clinical follow-up to resolution recommended to ensure no underlying lesions are present at these locations. MRA HEAD IMPRESSION: 1. Proximal left M3 occlusion, in keeping with the acute left MCA distribution infarct. 2. Otherwise wide patency of the major intracranial arterial circulation. No other proximal high-grade or correctable stenosis. 3. 3 mm outpouching arising from the supraclinoid right ICA, favored to reflect a small vascular infundibulum, although possible aneurysm difficult to exclude. Tension at follow-up recommended. Please note that an MRA neck was ordered for this exam, but was unable to be completed as the patient was unable to tolerate the full length of the study. No tract to should be applied. Electronically Signed   By: Jeannine Boga M.D.   On: 11/07/2020 20:22   MR ANGIO NECK W WO  CONTRAST  Result Date: 11/03/2020 CLINICAL DATA:  Follow-up examination for acute stroke. EXAM: MRI HEAD WITHOUT CONTRAST MRA HEAD WITHOUT CONTRAST TECHNIQUE: Multiplanar, multiecho pulse sequences of the brain and surrounding structures were obtained without intravenous contrast. Angiographic images of the head were obtained using MRA technique without contrast. COMPARISON:  Prior CT from earlier the same day. FINDINGS: MRI HEAD FINDINGS Brain: Examination degraded by motion artifact. Generalized age-related cerebral atrophy. Multiple scattered foci of diffusion abnormality seen involving the cortical and subcortical aspect of the left cerebral hemisphere, with involvement of the left insula and overlying left frontal parietal cortex. Minimal patchy involvement of the left basal ganglia and subinsular white matter. Associated minimal petechial hemorrhage at the left insula without hemorrhagic transformation. 1.4 cm focus of susceptibility artifact at the left cerebellum, consistent with a small intraparenchymal hemorrhage, corresponding with abnormality on prior CT (series 18, image 14). Finding could reflect a hemorrhage or possibly hemorrhagic infarction. In additional smaller 5 mm hemorrhage seen at the contralateral right cerebellum (series 18, image 11). Mild localized edema without significant regional mass effect. No other evidence for acute or chronic intracranial hemorrhage. No other mass lesion,  midline shift or mass effect. No hydrocephalus or extra-axial fluid collection. Pituitary gland suprasellar region within normal limits. Midline structures intact. Vascular: Major intracranial vascular flow voids are maintained. Skull and upper cervical spine: Craniocervical junction within normal limits. Normal bone marrow signal intensity. No focal marrow replacing lesion. No scalp soft tissue abnormality. Sinuses/Orbits: Globes and orbital soft tissues within normal limits. Paranasal sinuses are clear. No  significant mastoid effusion. Inner ear structures grossly normal. Other: None. MRA HEAD FINDINGS ANTERIOR CIRCULATION: Visualized distal cervical segments of the internal carotid arteries are patent with antegrade flow. Petrous, cavernous, and supraclinoid segments patent without stenosis. 3 mm focal outpouching arising from the supraclinoid right ICA could reflect a small vascular infundibulum related to a hypoplastic right PCOM versus small aneurysm (series 9, image 88). A1 segments patent bilaterally. Normal anterior communicating artery complex. Anterior cerebral arteries patent bilaterally. No M1 stenosis or occlusion. Normal MCA bifurcations. Distal right MCA branches well perfuse. On the left, susceptibility artifact is seen at the level of the right sylvian fissure, suspected to correspond with a distal right M3 occlusion, faintly seen on corresponding MRA (series 1070, image 18). Remainder of the distal left MCA branches perfused. POSTERIOR CIRCULATION: Visualized vertebral arteries widely patent to the vertebrobasilar junction. Both PICA origins patent and normal. Basilar patent to its distal aspect without stenosis. Superior cerebellar arteries patent bilaterally. 2 mm focal outpouching at the origin of the right SCA favored to reflect a small vascular infundibulum. Right PCA supplied via the basilar. Left PCA supplied via the basilar as well as a prominent left posterior communicating artery. Both PCAs well perfused to their distal aspects. IMPRESSION: MRI HEAD IMPRESSION: 1. 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 Associated faint petechial hemorrhage at the left insula without hemorrhagic transformation or significant mass effect. 2. 1.4 cm acute intraparenchymal hemorrhage at the left cerebellum, corresponding with abnormality on prior CT. This could reflect a cerebellar hemorrhage or possibly hemorrhagic infarction. Additional smaller 5 mm hemorrhage at the contralateral right  cerebellum. Clinical follow-up to resolution recommended to ensure no underlying lesions are present at these locations. MRA HEAD IMPRESSION: 1. Proximal left M3 occlusion, in keeping with the acute left MCA distribution infarct. 2. Otherwise wide patency of the major intracranial arterial circulation. No other proximal high-grade or correctable stenosis. 3. 3 mm outpouching arising from the supraclinoid right ICA, favored to reflect a small vascular infundibulum, although possible aneurysm difficult to exclude. Tension at follow-up recommended. Please note that an MRA neck was ordered for this exam, but was unable to be completed as the patient was unable to tolerate the full length of the study. No tract to should be applied. Electronically Signed   By: Jeannine Boga M.D.   On: 11/10/2020 20:22   MR BRAIN WO CONTRAST  Result Date: 11/26/2020 CLINICAL DATA:  Follow-up examination for acute stroke. EXAM: MRI HEAD WITHOUT CONTRAST MRA HEAD WITHOUT CONTRAST TECHNIQUE: Multiplanar, multiecho pulse sequences of the brain and surrounding structures were obtained without intravenous contrast. Angiographic images of the head were obtained using MRA technique without contrast. COMPARISON:  Prior CT from earlier the same day. FINDINGS: MRI HEAD FINDINGS Brain: Examination degraded by motion artifact. Generalized age-related cerebral atrophy. Multiple scattered foci of diffusion abnormality seen involving the cortical and subcortical aspect of the left cerebral hemisphere, with involvement of the left insula and overlying left frontal parietal cortex. Minimal patchy involvement of the left basal ganglia and subinsular white matter. Associated minimal petechial hemorrhage at the left  insula without hemorrhagic transformation. 1.4 cm focus of susceptibility artifact at the left cerebellum, consistent with a small intraparenchymal hemorrhage, corresponding with abnormality on prior CT (series 18, image 14). Finding  could reflect a hemorrhage or possibly hemorrhagic infarction. In additional smaller 5 mm hemorrhage seen at the contralateral right cerebellum (series 18, image 11). Mild localized edema without significant regional mass effect. No other evidence for acute or chronic intracranial hemorrhage. No other mass lesion, midline shift or mass effect. No hydrocephalus or extra-axial fluid collection. Pituitary gland suprasellar region within normal limits. Midline structures intact. Vascular: Major intracranial vascular flow voids are maintained. Skull and upper cervical spine: Craniocervical junction within normal limits. Normal bone marrow signal intensity. No focal marrow replacing lesion. No scalp soft tissue abnormality. Sinuses/Orbits: Globes and orbital soft tissues within normal limits. Paranasal sinuses are clear. No significant mastoid effusion. Inner ear structures grossly normal. Other: None. MRA HEAD FINDINGS ANTERIOR CIRCULATION: Visualized distal cervical segments of the internal carotid arteries are patent with antegrade flow. Petrous, cavernous, and supraclinoid segments patent without stenosis. 3 mm focal outpouching arising from the supraclinoid right ICA could reflect a small vascular infundibulum related to a hypoplastic right PCOM versus small aneurysm (series 9, image 88). A1 segments patent bilaterally. Normal anterior communicating artery complex. Anterior cerebral arteries patent bilaterally. No M1 stenosis or occlusion. Normal MCA bifurcations. Distal right MCA branches well perfuse. On the left, susceptibility artifact is seen at the level of the right sylvian fissure, suspected to correspond with a distal right M3 occlusion, faintly seen on corresponding MRA (series 1070, image 18). Remainder of the distal left MCA branches perfused. POSTERIOR CIRCULATION: Visualized vertebral arteries widely patent to the vertebrobasilar junction. Both PICA origins patent and normal. Basilar patent to its  distal aspect without stenosis. Superior cerebellar arteries patent bilaterally. 2 mm focal outpouching at the origin of the right SCA favored to reflect a small vascular infundibulum. Right PCA supplied via the basilar. Left PCA supplied via the basilar as well as a prominent left posterior communicating artery. Both PCAs well perfused to their distal aspects. IMPRESSION: MRI HEAD IMPRESSION: 1. 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 Associated faint petechial hemorrhage at the left insula without hemorrhagic transformation or significant mass effect. 2. 1.4 cm acute intraparenchymal hemorrhage at the left cerebellum, corresponding with abnormality on prior CT. This could reflect a cerebellar hemorrhage or possibly hemorrhagic infarction. Additional smaller 5 mm hemorrhage at the contralateral right cerebellum. Clinical follow-up to resolution recommended to ensure no underlying lesions are present at these locations. MRA HEAD IMPRESSION: 1. Proximal left M3 occlusion, in keeping with the acute left MCA distribution infarct. 2. Otherwise wide patency of the major intracranial arterial circulation. No other proximal high-grade or correctable stenosis. 3. 3 mm outpouching arising from the supraclinoid right ICA, favored to reflect a small vascular infundibulum, although possible aneurysm difficult to exclude. Tension at follow-up recommended. Please note that an MRA neck was ordered for this exam, but was unable to be completed as the patient was unable to tolerate the full length of the study. No tract to should be applied. Electronically Signed   By: Jeannine Boga M.D.   On: 11/26/2020 20:22   MR BRAIN W CONTRAST  Result Date: 09-Dec-2020 CLINICAL DATA:  Follow-up examination for stroke. EXAM: MRI HEAD WITH CONTRAST TECHNIQUE: Multiplanar, multiecho pulse sequences of the brain and surrounding structures were obtained with intravenous contrast. CONTRAST:  39mL GADAVIST GADOBUTROL 1  MMOL/ML IV SOLN COMPARISON:  Prior  MRI from earlier the same day. FINDINGS: Previously identified left cerebellar hemorrhage demonstrates ring enhancement, measuring 1.4 cm. Additional right cerebellar lesion also demonstrates ring enhancement, measuring 7 mm (series 6, image 14). There are a few additional subtle small ring-enhancing lesions at the right occipital lobe (series 7, images 7, 3). Largest of these additional lesions measures 7 mm. There is an additional 5 mm lesion at the high left frontal convexity (series 7, image 17). Probable additional punctate cortical lesion at the anterior left frontal lobe (series 9, image 21). Given the presence of multiple lesions, findings most consistent with intracranial metastatic disease, hemorrhagic in nature given previous exams. Mild edema about the cerebellar lesions. No other significant edema or regional mass effect. No other convincing pathologic enhancement on this motion degraded exam. IMPRESSION: Multiple ring-enhancing lesions involving the bilateral cerebellar hemispheres, right occipital lobe, and high left frontal lobe as above, consistent with intracranial metastatic disease. Lesions at the cerebellum are hemorrhagic in nature given findings on prior exams. Dedicated metastatic workup recommended if no primary is known at this time. Electronically Signed   By: Jeannine Boga M.D.   On: December 01, 2020 00:19   DG Chest Portable 1 View  Result Date: 12/02/2020 CLINICAL DATA:  Altered mental status, cough EXAM: PORTABLE CHEST 1 VIEW COMPARISON:  11/18/2020 FINDINGS: Heart is normal size. Layering right pleural effusion. Right lower lobe airspace opacity increasing since prior study and obscuring the previously seen right lower lobe mass. Left base atelectasis. There is hyperinflation of the lungs compatible with COPD. No acute bony abnormality. IMPRESSION: COPD. Layering right pleural effusion. Increasing right lower lobe airspace opacity concerning for  pneumonia. Electronically Signed   By: Rolm Baptise M.D.   On: 11/30/2020 20:34   CT HEAD CODE STROKE WO CONTRAST  Result Date: 11/09/2020 CLINICAL DATA:  Code stroke.  Right-sided weakness EXAM: CT HEAD WITHOUT CONTRAST TECHNIQUE: Contiguous axial images were obtained from the base of the skull through the vertex without intravenous contrast. COMPARISON:  None. FINDINGS: Brain: Brainstem appears normal. There appears to be an abnormal hyperdensity in the left cerebellum measuring approximately 1 cm in size. This could represent a cerebellar hemorrhage, hemorrhagic infarction or possibly a cavernoma. Cerebral hemispheres appear normal. No atrophy. No sign of acute infarction. No mass, hydrocephalus or extra-axial collection. Vascular: No abnormal vascular finding Skull: Normal Sinuses/Orbits: Clear/normal Other: None ASPECTS ([place] Stroke Program Early CT Score) - Ganglionic level infarction (caudate, lentiform nuclei, internal capsule, insula, M1-M3 cortex): 7 - Supraganglionic infarction (M4-M6 cortex): 3 Total score (0-10 with 10 being normal): 10 IMPRESSION: 1. 1 cm hyperdensity in the left cerebellum. This could represent a cerebellar hemorrhage, hemorrhagic infarction or possibly a cavernoma. 2. ASPECTS is 10. 3. These results were communicated to Dr. Quinn Axe At 6:45 pmon 3/18/2022by telephone discussion. Electronically Signed   By: Nelson Chimes M.D.   On: 11/15/2020 18:50        Scheduled Meds: . amiodarone  200 mg Oral Daily   Continuous Infusions: . ceFEPime (MAXIPIME) IV Stopped (12-01-2020 0332)  . sodium chloride Stopped (2020-12-01 0231)  . [START ON 11/22/2020] vancomycin       Leonard: 1 day   Time spent= 35 mins    Devoiry Corriher Arsenio Loader, MD [facility]  If 7PM-7AM, please contact night-coverage  12/01/20, 7:26 AM

## 2020-12-04 NOTE — Progress Notes (Signed)
Pharmacy Antibiotic Note  Brian Leonard is a 60 y.o. male admitted on 12/01/2020 with pneumonia.  Pharmacy has been consulted for Vancomycin and Cefepime dosing.  Plan: Cefepime 2g IV q12h Vancomycin 1250 mg IV now then 1000 mg IV Q 24 hrs. Goal AUC 400-550. Expected AUC: 482 SCr used: 1.3 Will f/u renal function, micro data, and pt's clinical condition Vanc levels prn    Temp (24hrs), Avg:98.5 F (36.9 C), Min:98.1 F (36.7 C), Max:98.9 F (37.2 C)  Recent Labs  Lab 11/16/20 0106 11/17/20 0110 11/18/20 0023 11/19/20 0037 11/30/2020 0850 11/18/2020 1828 11/19/2020 1833  WBC 8.7  --  9.9 12.7* 17.6* 17.9*  --   CREATININE 0.83   < > 0.89 0.87 1.06 1.52* 1.30*   < > = values in this interval not displayed.    Estimated Creatinine Clearance: 52.5 mL/min (A) (by C-G formula based on SCr of 1.3 mg/dL (H)).    Allergies  Allergen Reactions  . Iodine Anaphylaxis  . Shellfish Allergy Anaphylaxis  . Other Hives and Swelling    Reaction to unknown oral asthma medication (pt has taken Sherlyn Lick in the past - 10 yrs ago with no reaction)    Antimicrobials this admission: 3/19 Vanc>>  3/19 Cefepime >>   Microbiology results:   BCx:   Thank you for allowing pharmacy to be a part of this patient's care.  Sherlon Handing, PharmD, BCPS Please see amion for complete clinical pharmacist phone list November 24, 2020 1:25 AM

## 2020-12-04 NOTE — Progress Notes (Signed)
EEG attempted in ED, pt was not in room.

## 2020-12-04 NOTE — Progress Notes (Signed)
VASCULAR LAB    Bilateral lower extremity venous duplex has been performed.  See CV proc for preliminary results.   Gave verbal report to [place], RN  Harlean Regula, RVT November 23, 2020, 3:00 PM

## 2020-12-04 NOTE — Progress Notes (Signed)
VASCULAR LAB    Carotid duplex has been performed.  See CV proc for preliminary results.   Opha Mcghee, RVT December 19, 2020, 6:06 PM

## 2020-12-04 NOTE — ED Notes (Signed)
Pt back form mri after having increased right sided weakness and dysarthria.

## 2020-12-04 NOTE — ED Notes (Signed)
Provider made aware of critical lab value

## 2020-12-04 NOTE — Progress Notes (Signed)
COURTESY NOTE:   Visited patient in room, wife and son present; gave them a copy of the brain MRI. They understand the situation--the patient's wife has a medical background--and they have opted for comfort care. I am in agreement. I will make sure Dr Julien Nordmann is aware when he returns 11/23/2020.  Please let me know if I can be of any help.

## 2020-12-04 NOTE — Progress Notes (Signed)
Morphine drip 88.52mg  wasted in Med room in stericycle. Dionne Bucy, RN witnessed at [phone]

## 2020-12-04 NOTE — ED Notes (Addendum)
RN to bedside to check on patient. Patient noted to have worsening neuro status. This RN found ED provider, Dr. Leonette Monarch and additional RN, Merrilyn Puma to assess neuro status as well. Patient no longer able to lift

## 2020-12-04 NOTE — Progress Notes (Signed)
Brief Neuro Update:  I was notified by RN about worsening Neuro status and NIHSS. On my evaluation, patient has global aphasia, Left gaze preference, little movement in RUE and RLE, NIHSS of 15. Symptoms are concerning for large Left MCA stroke.   I revisited the discussion regarding potential thrombectomy with patient's wife and his father at bedside and also with Neuro IR team. 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 This is further complicated by the fact that he is in afibb with RVR with soft blood pressure and has malignant pericardial effusion.  I spoke with patient's wife and father at length about this and they agree with me that given everything that is going on, we should hold off on thrombectomy at this time.  Recs: - Palliative consult   [place] Pager Number [phone]

## 2020-12-04 NOTE — ED Notes (Signed)
neurologist at bedside updating family

## 2020-12-04 NOTE — Progress Notes (Signed)
Physical Therapy Discharge Patient Details Name: Brian Leonard MRN: [id number] DOB: 1961/06/18 Today's Date: Dec 15, 2020 Time:  -     Patient discharged from PT services secondary to Pt has transitioned to comfort care before PT evaluation could be initiated.   Pt's family has decided to transition pt to comfort care.   If pt improves and they decide to attempt more aggressive measures, please re-consult PT.   [name], PT, DPT Acute Rehabilitation Services  Pager: [phone] Office: [phone]   GP     [name] 12/15/20, 4:06 PM

## 2020-12-04 NOTE — Progress Notes (Signed)
STROKE TEAM PROGRESS NOTE   SUBJECTIVE (INTERVAL HISTORY) His sister and brother are at the bedside.  Pt stupeous, SOB, very discomfort. Discussed with family at bedside and reviewed Dr. Virgie Dad note, agree with comfort care. Will start morphine drip and transition to comfort care.     OBJECTIVE Temp:  [98 F (36.7 C)] 98 F (36.7 C) (03/19 0822) Pulse Rate:  [117-128] 121 (03/19 1658) Cardiac Rhythm: Atrial fibrillation (03/18 2208) Resp:  [18-31] 31 (03/19 0822) BP: (87-107)/(61-90) 104/61 (03/19 1658) SpO2:  [92 %-96 %] 95 % (03/19 1658)  Recent Labs  Lab 11/18/20 1109 11/18/20 1539 11/18/20 2133 11/19/20 0601 12/03/2020 1825  GLUCAP 109* 117* 122* 122* 153*   Recent Labs  Lab 11/17/20 0110 11/18/20 0023 11/18/20 1931 11/19/20 0037 11/26/2020 0850 11/18/2020 1828 11/29/2020 1833  NA 132* 132*  --  132* 131* 129* 130*  K 3.7 4.3  --  4.4 4.4 4.6 4.6  CL 97* 95*  --  96* 96* 93* 93*  CO2 27 28  --  25 23 22   --   GLUCOSE 113* 108*  --  124* 140* 165* 159*  BUN 10 11  --  12 18 24* 32*  CREATININE 0.88 0.89  --  0.87 1.06 1.52* 1.30*  CALCIUM 9.0 9.3  --  9.7 10.0 9.7  --   MG  --   --  1.9 1.8  --   --   --    Recent Labs  Lab 11/19/20 0037 11/04/2020 0850 11/17/2020 1828  AST 18 17 34  ALT 17 16 23   ALKPHOS 105 140* 199*  BILITOT 0.9 0.7 0.8  PROT 5.8* 6.6 5.8*  ALBUMIN 2.9* 3.1* 2.9*   Recent Labs  Lab 11/18/20 0023 11/19/20 0037 12/02/2020 0850 12/03/2020 1828 11/29/2020 1833 12/16/20 0432  WBC 9.9 12.7* 17.6* 17.9*  --  19.0*  NEUTROABS  --  10.7* 15.5* 15.6*  --   --   HGB 11.1* 11.3* 11.1* 10.5* 10.9* 9.8*  HCT 31.7* 31.8* 32.4* 30.4* 32.0* 29.2*  MCV 94.3 92.7 92.6 94.7  --  95.7  PLT 371 341 290 262  --  208   No results for input(s): CKTOTAL, CKMB, CKMBINDEX, TROPONINI in the last 168 hours. Recent Labs    11/05/2020 1828  LABPROT 17.3*  INR 1.5*   No results for input(s): COLORURINE, LABSPEC, PHURINE, GLUCOSEU, HGBUR, BILIRUBINUR, KETONESUR,  PROTEINUR, UROBILINOGEN, NITRITE, LEUKOCYTESUR in the last 72 hours.  Invalid input(s): APPERANCEUR     Component Value Date/Time   CHOL 150 12-16-20 0432   TRIG 99 12-16-2020 0432   HDL 42 2020-12-16 0432   CHOLHDL 3.6 16-Dec-2020 0432   VLDL 20 12-16-2020 0432   LDLCALC 88 December 16, 2020 0432   Lab Results  Component Value Date   HGBA1C 6.0 (H) 2020-12-16   No results found for: LABOPIA, COCAINSCRNUR, LABBENZ, AMPHETMU, THCU, LABBARB  No results for input(s): ETH in the last 168 hours.  I have personally reviewed the radiological images below and agree with the radiology interpretations.  DG Chest 2 View  Result Date: 11/18/2020 CLINICAL DATA:  Cough and shortness of breath. EXAM: CHEST - 2 VIEW COMPARISON:  Chest x-ray from yesterday. FINDINGS: Normal heart size. Unchanged right hilar and upper mediastinal lymphadenopathy. The lungs remain hyperinflated with emphysematous changes. Unchanged small right greater than left pleural effusions with bibasilar atelectasis. IMPRESSION: 1. Unchanged small right greater than left pleural effusions with bibasilar atelectasis. 2. Unchanged thoracic lymphadenopathy. 3. COPD. Electronically Signed   By:  Titus Dubin M.D.   On: 11/18/2020 10:59   DG Chest 2 View  Result Date: 11/17/2020 CLINICAL DATA:  Pericardial drain removed. EXAM: CHEST - 2 VIEW COMPARISON:  11/16/2020 and multiple previous FINDINGS: Pericardial drain is been removed. Lead artifacts overlie the mediastinum and heart. Small left effusion with left lower lobe volume loss. This is slightly improved. Small to moderate size right effusion with right lower lung volume loss, also improved. Emphysema and scarring again noted at both lung apices. IMPRESSION: Interval removal of pericardial drain. Slight improvement in small left effusion and left lower lobe volume loss. Small to moderate right effusion with right lower lung volume loss, also slightly improved. Electronically Signed   By:  Nelson Chimes M.D.   On: 11/17/2020 08:47   DG Chest 2 View  Result Date: 11/09/2020 CLINICAL DATA:  Back pain and productive cough. EXAM: CHEST - 2 VIEW COMPARISON:  None. FINDINGS: Lungs are hyperexpanded. Soft tissue fullness noted in the right hilum. Left lung unremarkable. No pleural effusion. The visualized bony structures of the thorax show no acute abnormality. IMPRESSION: Soft tissue fullness in the right hilum. CT chest with contrast recommended to further evaluate as lymphadenopathy/neoplasm a concern. Electronically Signed   By: Misty Stanley M.D.   On: 11/09/2020 20:21   DG Abd 1 View  Result Date: 11/19/2020 CLINICAL DATA:  Abdomen pain EXAM: ABDOMEN - 1 VIEW COMPARISON:  CT 11/10/2020 FINDINGS: Small bilateral pleural effusions. Nonobstructed gas pattern with residual contrast in the right colon. No radiopaque calculi. IMPRESSION: Nonobstructed gas pattern. Electronically Signed   By: Donavan Foil M.D.   On: 11/19/2020 00:02   CT HEAD WO CONTRAST  Result Date: 11/26/20 CLINICAL DATA:  60 year old male with multiple ring-enhancing cerebellar lesions, some hemorrhagic on MRI suspicious for metastatic disease. Superimposed patchy left MCA infarct on MRI. Increasing right side weakness. EXAM: CT HEAD WITHOUT CONTRAST TECHNIQUE: Contiguous axial images were obtained from the base of the skull through the vertex without intravenous contrast. COMPARISON:  Brain MRI 11/28/2020 and earlier. FINDINGS: Brain: Small left cerebellar hyperdense lesion seems less discrete since the CT on 11/14/2020 (series 4, image 9 today). No significant posterior fossa mass effect or cerebellar edema at this time. Cytotoxic edema is now evident at the left insula (series 4, image 15) and appears larger from the recent DWI. Cortical edema also evident in the lateral perirolandic area series 4, image 21. No hemorrhage or mass effect associated with these areas. Elsewhere stable gray-white matter differentiation. No  midline shift. No ventriculomegaly. No new intracranial hemorrhage identified. Vascular: Mild Calcified atherosclerosis at the skull base. No suspicious intracranial vascular hyperdensity. Skull: No acute or suspicious osseous lesion identified. Sinuses/Orbits: Visualized paranasal sinuses and mastoids are stable and well pneumatized. Other: Stable orbit and scalp soft tissues. Prominent superior ophthalmic veins again noted. IMPRESSION: 1. Mild expansion of the left MCA territory ischemia at the insula since 11/22/2020. No hemorrhagic transformation or intracranial mass effect. 2. Left cerebellar hyperdense lesion appears mildly improved by CT since 11/28/2020. No new intracranial lesion or blood products identified. Electronically Signed   By: Genevie Ann M.D.   On: November 26, 2020 04:39   CT Chest W Contrast  Addendum Date: 11/10/2020   ADDENDUM REPORT: 11/10/2020 01:06 ADDENDUM: Additional small centrally necrotic appearing deposits appear to be located either along or adjacent the right hemidiaphragm, either within the lung base or subphrenic space (3/150). Addendum was called by telephone at the time of interpretation on 11/10/2020 at 1:06 am to  provider Krista Blue , who verbally acknowledged these results. Electronically Signed   By: Lovena Le M.D.   On: 11/10/2020 01:06   Result Date: 11/10/2020 CLINICAL DATA:  Abnormal radiograph, concern for mass EXAM: CT CHEST WITH CONTRAST TECHNIQUE: Multidetector CT imaging of the chest was performed during intravenous contrast administration. CONTRAST:  66mL OMNIPAQUE IOHEXOL 300 MG/ML  SOLN COMPARISON:  Radiograph 11/09/2020 FINDINGS: Cardiovascular: Normal cardiac size. Intermediate attenuation pericardial effusion, possibly malignant given the bulky mediastinal and hilar nodal disease. The aortic root is suboptimally assessed given cardiac pulsation artifact. The aorta is normal caliber. Aberrant right subclavian artery with small diverticulum of Kommerell. Proximal  great vessels are otherwise unremarkable. Central pulmonary arteries are top-normal caliber. No large central or lobar filling defects within limitations of this non tailored examination of the pulmonary arteries. Reflux of contrast into the IVC and hepatic veins. Hypoattenuating filling defect is seen extending from the within the left brachiocephalic vein 1/61-09, clear this reflects direct extension or merely thrombus in the setting of compression by the bulky disease Mediastinum/Nodes: Extensive mediastinal, hilar and supraclavicular adenopathy throughout the chest bilaterally. Some central hypoattenuation concerning necrotic adenopathy. Largest supraclavicular nodal deposit noted on the right measuring up to 3.1 x 3 cm in size (3/23). Paratracheal node measuring 3.3 x 1.9 cm (3/70). Right hilar deposit measuring 3.3 x 3.2 cm (3/90). Conglomerate adenopathy noted in the AP window and prevascular space as well. No acute abnormality of the trachea or esophagus. Hypoattenuating nodule in the thyroid isthmus measuring up to 1.7 cm in size. Lungs/Pleura: Centrally hypoattenuating lobular mass lesion seen in the posterosuperior segment right lower lobe measuring approximately 2.3 x 2.4 cm in size (4/93). Additional smaller nodules are seen throughout both lungs. Findings are on a background of diffuse centrilobular and paraseptal emphysematous changes as well as bronchitic features with airways thickening and scattered secretions. Regions of bandlike scarring and/or atelectasis are noted throughout both lungs. Trace right effusion with adjacent atelectasis. No left effusion. Upper Abdomen: Heterogeneous, largely hypoattenuating focus in the tail of the pancreas measuring 2.5 x 2.1 cm (3/21). Indeterminate hyperattenuating focus in the posterior right lobe liver measuring 1.3 x 1.3 cm (3/182), possible flash filling hemangioma though should be viewed with conspicuity given additional findings. No other acute  abnormality in the upper abdomen. Musculoskeletal: Lucent lesion seen involving the T9 anterior inferior vertebral body with some mild soft tissue extension. Additional lucent lesion in the posterior left ninth rib (3/114) some ill defined sclerotic change in the right seventh rib laterally. No other consider acute or conspicuous osseous lesions. IMPRESSION: 1. 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 Differential considerations could include a primary pulmonary malignancy with widespread metastases versus metastatic disease from a partially visualized pancreatic tail lesion. Alternatively, atypical infection such as mycobacterial/tuberculosis should be excluded as well. 2. Trace right pleural effusion. 3. Reflux of contrast in the hepatic veins and IVC could reflect some elevated right heart pressures/right-sided dysfunction. Correlate with echocardiogram particularly given the presence pericardial effusion. 4. Hypoattenuating filling defect is seen in the left brachiocephalic vein, unclear if this could reflect extension from the nodal disease or merely bland thrombus with compression of the left brachiocephalic vein as it passes through the extensive prevascular deposits. No large central or lobar filling defects are seen within the  pulmonary arteries though evaluation limited on this non tailored examination. 5. Lucent lesion involving the T9 anterior inferior vertebral body with some mild soft tissue extension. Additional lucent lesion in the posterior left ninth rib. Additional ill defined sclerotic change in the right seventh rib laterally. 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 6. Indeterminate hyperattenuating  focus in the posterior right lobe liver measuring 1.3 x 1.3 cm, possible flash filling hemangioma though should be viewed with conspicuity given additional findings. 7. Hypoattenuating 1.7 cm nodule in the thyroid isthmus, Recommend thyroid US (ref: J Am Coll Radiol. 2015 Feb;12(2): 143-50). 8. Aberrant right subclavian artery. 9. Emphysema (ICD10-J43.9). These results were called by telephone at the time of interpretation on 11/10/2020 at 12:49 am to provider PA McDonald, who verbally acknowledged these results. Electronically Signed: By: Lovena Le M.D. On: 11/10/2020 00:51   MR ANGIO HEAD WO CONTRAST  Result Date: 11/29/2020 CLINICAL DATA:  Follow-up examination for acute stroke. EXAM: MRI HEAD WITHOUT CONTRAST MRA HEAD WITHOUT CONTRAST TECHNIQUE: Multiplanar, multiecho pulse sequences of the brain and surrounding structures were obtained without intravenous contrast. Angiographic images of the head were obtained using MRA technique without contrast. COMPARISON:  Prior CT from earlier the same day. FINDINGS: MRI HEAD FINDINGS Brain: Examination degraded by motion artifact. Generalized age-related cerebral atrophy. Multiple scattered foci of diffusion abnormality seen involving the cortical and subcortical aspect of the left cerebral hemisphere, with involvement of the left insula and overlying left frontal parietal cortex. Minimal patchy involvement of the left basal ganglia and subinsular white matter. Associated minimal petechial hemorrhage at the left insula without hemorrhagic transformation. 1.4 cm focus of susceptibility artifact at the left cerebellum, consistent with a small intraparenchymal hemorrhage, corresponding with abnormality on prior CT (series 18, image 14). Finding could reflect a hemorrhage or possibly hemorrhagic infarction. In additional smaller 5 mm hemorrhage seen at the contralateral right cerebellum (series 18, image 11). Mild localized edema without significant regional mass effect.  No other evidence for acute or chronic intracranial hemorrhage. No other mass lesion, midline shift or mass effect. No hydrocephalus or extra-axial fluid collection. Pituitary gland suprasellar region within normal limits. Midline structures intact. Vascular: Major intracranial vascular flow voids are maintained. Skull and upper cervical spine: Craniocervical junction within normal limits. Normal bone marrow signal intensity. No focal marrow replacing lesion. No scalp soft tissue abnormality. Sinuses/Orbits: Globes and orbital soft tissues within normal limits. Paranasal sinuses are clear. No significant mastoid effusion. Inner ear structures grossly normal. Other: None. MRA HEAD FINDINGS ANTERIOR CIRCULATION: Visualized distal cervical segments of the internal carotid arteries are patent with antegrade flow. Petrous, cavernous, and supraclinoid segments patent without stenosis. 3 mm focal outpouching arising from the supraclinoid right ICA could reflect a small vascular infundibulum related to a hypoplastic right PCOM versus small aneurysm (series 9, image 88). A1 segments patent bilaterally. Normal anterior communicating artery complex. Anterior cerebral arteries patent bilaterally. No M1 stenosis or occlusion. Normal MCA bifurcations. Distal right MCA branches well perfuse. On the left, susceptibility artifact is seen at the level of the right sylvian fissure, suspected to correspond with a distal right M3 occlusion, faintly seen on corresponding MRA (series 1070, image 18). Remainder of the distal left MCA branches perfused. POSTERIOR CIRCULATION: Visualized vertebral arteries widely patent to the vertebrobasilar junction. Both PICA origins patent and normal. Basilar patent to its distal aspect without stenosis. Superior cerebellar arteries patent bilaterally. 2 mm focal outpouching at the origin of the right SCA favored to reflect a small vascular  infundibulum. Right PCA supplied via the basilar. Left PCA  supplied via the basilar as well as a prominent left posterior communicating artery. Both PCAs well perfused to their distal aspects. IMPRESSION: MRI HEAD IMPRESSION: 1. 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 Associated faint petechial hemorrhage at the left insula without hemorrhagic transformation or significant mass effect. 2. 1.4 cm acute intraparenchymal hemorrhage at the left cerebellum, corresponding with abnormality on prior CT. This could reflect a cerebellar hemorrhage or possibly hemorrhagic infarction. Additional smaller 5 mm hemorrhage at the contralateral right cerebellum. Clinical follow-up to resolution recommended to ensure no underlying lesions are present at these locations. MRA HEAD IMPRESSION: 1. Proximal left M3 occlusion, in keeping with the acute left MCA distribution infarct. 2. Otherwise wide patency of the major intracranial arterial circulation. No other proximal high-grade or correctable stenosis. 3. 3 mm outpouching arising from the supraclinoid right ICA, favored to reflect a small vascular infundibulum, although possible aneurysm difficult to exclude. Tension at follow-up recommended. Please note that an MRA neck was ordered for this exam, but was unable to be completed as the patient was unable to tolerate the full length of the study. No tract to should be applied. Electronically Signed   By: Jeannine Boga M.D.   On: 11/30/2020 20:22   MR ANGIO NECK W WO CONTRAST  Result Date: 11/16/2020 CLINICAL DATA:  Follow-up examination for acute stroke. EXAM: MRI HEAD WITHOUT CONTRAST MRA HEAD WITHOUT CONTRAST TECHNIQUE: Multiplanar, multiecho pulse sequences of the brain and surrounding structures were obtained without intravenous contrast. Angiographic images of the head were obtained using MRA technique without contrast. COMPARISON:  Prior CT from earlier the same day. FINDINGS: MRI HEAD FINDINGS Brain: Examination degraded by motion artifact. Generalized  age-related cerebral atrophy. Multiple scattered foci of diffusion abnormality seen involving the cortical and subcortical aspect of the left cerebral hemisphere, with involvement of the left insula and overlying left frontal parietal cortex. Minimal patchy involvement of the left basal ganglia and subinsular white matter. Associated minimal petechial hemorrhage at the left insula without hemorrhagic transformation. 1.4 cm focus of susceptibility artifact at the left cerebellum, consistent with a small intraparenchymal hemorrhage, corresponding with abnormality on prior CT (series 18, image 14). Finding could reflect a hemorrhage or possibly hemorrhagic infarction. In additional smaller 5 mm hemorrhage seen at the contralateral right cerebellum (series 18, image 11). Mild localized edema without significant regional mass effect. No other evidence for acute or chronic intracranial hemorrhage. No other mass lesion, midline shift or mass effect. No hydrocephalus or extra-axial fluid collection. Pituitary gland suprasellar region within normal limits. Midline structures intact. Vascular: Major intracranial vascular flow voids are maintained. Skull and upper cervical spine: Craniocervical junction within normal limits. Normal bone marrow signal intensity. No focal marrow replacing lesion. No scalp soft tissue abnormality. Sinuses/Orbits: Globes and orbital soft tissues within normal limits. Paranasal sinuses are clear. No significant mastoid effusion. Inner ear structures grossly normal. Other: None. MRA HEAD FINDINGS ANTERIOR CIRCULATION: Visualized distal cervical segments of the internal carotid arteries are patent with antegrade flow. Petrous, cavernous, and supraclinoid segments patent without stenosis. 3 mm focal outpouching arising from the supraclinoid right ICA could reflect a small vascular infundibulum related to a hypoplastic right PCOM versus small aneurysm (series 9, image 88). A1 segments patent  bilaterally. Normal anterior communicating artery complex. Anterior cerebral arteries patent bilaterally. No M1 stenosis or occlusion. Normal MCA bifurcations. Distal right MCA branches well perfuse. On the left, susceptibility artifact is seen at the  level of the right sylvian fissure, suspected to correspond with a distal right M3 occlusion, faintly seen on corresponding MRA (series 1070, image 18). Remainder of the distal left MCA branches perfused. POSTERIOR CIRCULATION: Visualized vertebral arteries widely patent to the vertebrobasilar junction. Both PICA origins patent and normal. Basilar patent to its distal aspect without stenosis. Superior cerebellar arteries patent bilaterally. 2 mm focal outpouching at the origin of the right SCA favored to reflect a small vascular infundibulum. Right PCA supplied via the basilar. Left PCA supplied via the basilar as well as a prominent left posterior communicating artery. Both PCAs well perfused to their distal aspects. IMPRESSION: MRI HEAD IMPRESSION: 1. 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 Associated faint petechial hemorrhage at the left insula without hemorrhagic transformation or significant mass effect. 2. 1.4 cm acute intraparenchymal hemorrhage at the left cerebellum, corresponding with abnormality on prior CT. This could reflect a cerebellar hemorrhage or possibly hemorrhagic infarction. Additional smaller 5 mm hemorrhage at the contralateral right cerebellum. Clinical follow-up to resolution recommended to ensure no underlying lesions are present at these locations. MRA HEAD IMPRESSION: 1. Proximal left M3 occlusion, in keeping with the acute left MCA distribution infarct. 2. Otherwise wide patency of the major intracranial arterial circulation. No other proximal high-grade or correctable stenosis. 3. 3 mm outpouching arising from the supraclinoid right ICA, favored to reflect a small vascular infundibulum, although possible aneurysm  difficult to exclude. Tension at follow-up recommended. Please note that an MRA neck was ordered for this exam, but was unable to be completed as the patient was unable to tolerate the full length of the study. No tract to should be applied. Electronically Signed   By: Jeannine Boga M.D.   On: 11/10/2020 20:22   MR BRAIN WO CONTRAST  Result Date: 11/06/2020 CLINICAL DATA:  Follow-up examination for acute stroke. EXAM: MRI HEAD WITHOUT CONTRAST MRA HEAD WITHOUT CONTRAST TECHNIQUE: Multiplanar, multiecho pulse sequences of the brain and surrounding structures were obtained without intravenous contrast. Angiographic images of the head were obtained using MRA technique without contrast. COMPARISON:  Prior CT from earlier the same day. FINDINGS: MRI HEAD FINDINGS Brain: Examination degraded by motion artifact. Generalized age-related cerebral atrophy. Multiple scattered foci of diffusion abnormality seen involving the cortical and subcortical aspect of the left cerebral hemisphere, with involvement of the left insula and overlying left frontal parietal cortex. Minimal patchy involvement of the left basal ganglia and subinsular white matter. Associated minimal petechial hemorrhage at the left insula without hemorrhagic transformation. 1.4 cm focus of susceptibility artifact at the left cerebellum, consistent with a small intraparenchymal hemorrhage, corresponding with abnormality on prior CT (series 18, image 14). Finding could reflect a hemorrhage or possibly hemorrhagic infarction. In additional smaller 5 mm hemorrhage seen at the contralateral right cerebellum (series 18, image 11). Mild localized edema without significant regional mass effect. No other evidence for acute or chronic intracranial hemorrhage. No other mass lesion, midline shift or mass effect. No hydrocephalus or extra-axial fluid collection. Pituitary gland suprasellar region within normal limits. Midline structures intact. Vascular: Major  intracranial vascular flow voids are maintained. Skull and upper cervical spine: Craniocervical junction within normal limits. Normal bone marrow signal intensity. No focal marrow replacing lesion. No scalp soft tissue abnormality. Sinuses/Orbits: Globes and orbital soft tissues within normal limits. Paranasal sinuses are clear. No significant mastoid effusion. Inner ear structures grossly normal. Other: None. MRA HEAD FINDINGS ANTERIOR CIRCULATION: Visualized distal cervical segments of the internal carotid arteries are patent with  antegrade flow. Petrous, cavernous, and supraclinoid segments patent without stenosis. 3 mm focal outpouching arising from the supraclinoid right ICA could reflect a small vascular infundibulum related to a hypoplastic right PCOM versus small aneurysm (series 9, image 88). A1 segments patent bilaterally. Normal anterior communicating artery complex. Anterior cerebral arteries patent bilaterally. No M1 stenosis or occlusion. Normal MCA bifurcations. Distal right MCA branches well perfuse. On the left, susceptibility artifact is seen at the level of the right sylvian fissure, suspected to correspond with a distal right M3 occlusion, faintly seen on corresponding MRA (series 1070, image 18). Remainder of the distal left MCA branches perfused. POSTERIOR CIRCULATION: Visualized vertebral arteries widely patent to the vertebrobasilar junction. Both PICA origins patent and normal. Basilar patent to its distal aspect without stenosis. Superior cerebellar arteries patent bilaterally. 2 mm focal outpouching at the origin of the right SCA favored to reflect a small vascular infundibulum. Right PCA supplied via the basilar. Left PCA supplied via the basilar as well as a prominent left posterior communicating artery. Both PCAs well perfused to their distal aspects. IMPRESSION: MRI HEAD IMPRESSION: 1. 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 Associated faint petechial hemorrhage  at the left insula without hemorrhagic transformation or significant mass effect. 2. 1.4 cm acute intraparenchymal hemorrhage at the left cerebellum, corresponding with abnormality on prior CT. This could reflect a cerebellar hemorrhage or possibly hemorrhagic infarction. Additional smaller 5 mm hemorrhage at the contralateral right cerebellum. Clinical follow-up to resolution recommended to ensure no underlying lesions are present at these locations. MRA HEAD IMPRESSION: 1. Proximal left M3 occlusion, in keeping with the acute left MCA distribution infarct. 2. Otherwise wide patency of the major intracranial arterial circulation. No other proximal high-grade or correctable stenosis. 3. 3 mm outpouching arising from the supraclinoid right ICA, favored to reflect a small vascular infundibulum, although possible aneurysm difficult to exclude. Tension at follow-up recommended. Please note that an MRA neck was ordered for this exam, but was unable to be completed as the patient was unable to tolerate the full length of the study. No tract to should be applied. Electronically Signed   By: Jeannine Boga M.D.   On: 12/03/2020 20:22   MR BRAIN W CONTRAST  Result Date: December 02, 2020 CLINICAL DATA:  Follow-up examination for stroke. EXAM: MRI HEAD WITH CONTRAST TECHNIQUE: Multiplanar, multiecho pulse sequences of the brain and surrounding structures were obtained with intravenous contrast. CONTRAST:  28mL GADAVIST GADOBUTROL 1 MMOL/ML IV SOLN COMPARISON:  Prior MRI from earlier the same day. FINDINGS: Previously identified left cerebellar hemorrhage demonstrates ring enhancement, measuring 1.4 cm. Additional right cerebellar lesion also demonstrates ring enhancement, measuring 7 mm (series 6, image 14). There are a few additional subtle small ring-enhancing lesions at the right occipital lobe (series 7, images 7, 3). Largest of these additional lesions measures 7 mm. There is an additional 5 mm lesion at the high left  frontal convexity (series 7, image 17). Probable additional punctate cortical lesion at the anterior left frontal lobe (series 9, image 21). Given the presence of multiple lesions, findings most consistent with intracranial metastatic disease, hemorrhagic in nature given previous exams. Mild edema about the cerebellar lesions. No other significant edema or regional mass effect. No other convincing pathologic enhancement on this motion degraded exam. IMPRESSION: Multiple ring-enhancing lesions involving the bilateral cerebellar hemispheres, right occipital lobe, and high left frontal lobe as above, consistent with intracranial metastatic disease. Lesions at the cerebellum are hemorrhagic in nature given findings on prior exams.  Dedicated metastatic workup recommended if no primary is known at this time. Electronically Signed   By: Jeannine Boga M.D.   On: 19-Dec-2020 00:19   CT ABDOMEN PELVIS W CONTRAST  Result Date: 11/10/2020 CLINICAL DATA:  Metastatic disease evaluation EXAM: CT ABDOMEN AND PELVIS WITH CONTRAST TECHNIQUE: Multidetector CT imaging of the abdomen and pelvis was performed using the standard protocol following bolus administration of intravenous contrast. CONTRAST:  124mL OMNIPAQUE IOHEXOL 300 MG/ML  SOLN COMPARISON:  Same-day CT chest, 11/10/2020 FINDINGS: Lower chest: Small right pleural effusion, increased compared to prior examination. Large pericardial effusion, similar to prior. Hepatobiliary: No solid liver abnormality is seen. Periportal edema. Pericholecystic fluid, nonspecific in the setting of ascites. No gallstones, gallbladder wall thickening, or biliary dilatation. Pancreas: Unremarkable. No pancreatic ductal dilatation or surrounding inflammatory changes. Spleen: Normal in size without significant abnormality. Adrenals/Urinary Tract: Adrenal glands are unremarkable. Kidneys are normal, without renal calculi, solid lesion, or hydronephrosis. Bladder is unremarkable.  Stomach/Bowel: Stomach is within normal limits. Appendix appears normal. No evidence of bowel wall thickening, distention, or inflammatory changes. Vascular/Lymphatic: Aortic atherosclerosis. No enlarged abdominal or pelvic lymph nodes. Reproductive: No mass or other significant abnormality. Other: No abdominal wall hernia or abnormality. Small volume ascites throughout the abdomen and pelvis. Musculoskeletal: Small lucent and rim sclerotic lesions noted in the pelvis, for example in the right ilium (series 3, image 54, 57). IMPRESSION: 1. No CT evidence of lymphadenopathy soft tissue metastatic disease in the abdomen or pelvis. 2. Small lucent and rim sclerotic lesions noted in the bony pelvis, suspicious for subtle osseous metastatic disease given the presence of a presumed metastatic lesion of the T9 vertebral body. PET-CT is more sensitive for the detection of subtle metabolically active osseous metastatic disease. 3. Small volume ascites throughout the abdomen and pelvis, nonspecific. 4. Periportal edema and pericholecystic fluid, nonspecific in the setting of ascites. 5. Small right pleural effusion, increased compared to prior examination. 6. Large pericardial effusion, similar to prior examination. Aortic Atherosclerosis (ICD10-I70.0). Electronically Signed   By: Eddie Candle M.D.   On: 11/10/2020 12:46   DG Chest Portable 1 View  Result Date: 11/12/2020 CLINICAL DATA:  Altered mental status, cough EXAM: PORTABLE CHEST 1 VIEW COMPARISON:  11/18/2020 FINDINGS: Heart is normal size. Layering right pleural effusion. Right lower lobe airspace opacity increasing since prior study and obscuring the previously seen right lower lobe mass. Left base atelectasis. There is hyperinflation of the lungs compatible with COPD. No acute bony abnormality. IMPRESSION: COPD. Layering right pleural effusion. Increasing right lower lobe airspace opacity concerning for pneumonia. Electronically Signed   By: Rolm Baptise M.D.    On: 11/23/2020 20:34   DG Chest Port 1 View  Result Date: 11/16/2020 CLINICAL DATA:  Pericardial effusion.  Chest pain EXAM: PORTABLE CHEST 1 VIEW COMPARISON:  November 14, 2020 chest radiograph and chest CT FINDINGS: Pericardial drain in place. The heart size and pulmonary vascularity appear within normal limits. There are pleural effusions bilaterally with bibasilar atelectasis. Fullness in the right paratracheal region is stable, consistent with adenopathy noted on recent CT. There is a degree of right hilar and azygos region adenopathy as well. No bone lesions. Postoperative change lower cervical region. IMPRESSION: Pericardial drain in place.  Heart size normal. Pleural effusions bilaterally with bibasilar atelectasis. Areas of adenopathy in the right paratracheal, azygos, and right hilar regions seen by radiography. Adenopathy in other areas better seen on CT. Electronically Signed   By: Lowella Grip III M.D.   On:  11/16/2020 07:59   DG CHEST PORT 1 VIEW  Result Date: 11/14/2020 CLINICAL DATA:  pain, chest pain and cough.  Tobacco use. EXAM: PORTABLE CHEST 1 VIEW COMPARISON:  Chest x-ray 11/13/2020, CT chest 11/10/2020, CT chest 11/10/2020 FINDINGS: Hyperinflation with cystic changes consistent with emphysema. The heart size and mediastinal contours are unchanged. Persistent right hilar prominence. Bilateral trace to small volume pleural effusions, likely right greater than left. Right lower lobe opacity. No pneumothorax. No acute osseous abnormality. IMPRESSION: 1. Similar findings with persistent right lower lung zone opacity as well as bilateral trace to small volume pleural effusions, likely right greater than left. 2.  Emphysema (ICD10-J43.9). Electronically Signed   By: Iven Finn M.D.   On: 11/14/2020 06:59   DG CHEST PORT 1 VIEW  Result Date: 11/13/2020 CLINICAL DATA:  Pericardial effusion EXAM: PORTABLE CHEST 1 VIEW COMPARISON:  November 12, 2020 FINDINGS: Apparent pericardial drain  present, unchanged in position. Heart size within normal limits. Pulmonary vascularity within normal limits. There are pleural effusions bilaterally with consolidation in the left lower lobe. There is apparent bullous disease in the left apex region. No adenopathy evident. Postoperative change lower cervical region. IMPRESSION: Apparent pericardial drain present. Heart size within normal limits. Pleural effusions bilaterally. Left lower lobe consolidation may in part be due to atelectasis; a degree of superimposed pneumonia in the left lower lobe cannot be excluded. Bullous disease noted left apex. Electronically Signed   By: Lowella Grip III M.D.   On: 11/13/2020 08:32   DG CHEST PORT 1 VIEW  Result Date: 11/12/2020 CLINICAL DATA:  Exudative pericardial effusion status post pericardial window EXAM: PORTABLE CHEST 1 VIEW COMPARISON:  11/11/2020 FINDINGS: Right basilar consolidation and associated small right pleural effusion is stable. Mild interval improvement in interstitial infiltrate within the right upper lobe. No typical biapical cicatricial change or cavitary lesions identified to suggest granulomatous infection. Small left basilar pleural effusion is again noted. No pneumothorax. Cardiac size within normal limits. Right hilar enlargement again seen in keeping with right hilar adenopathy better seen on prior CT examination. IMPRESSION: Minimally improved right lung pulmonary infiltrate. Right basilar consolidation and associated small right pleural effusion again noted. Small left pleural effusion present. Enlargement of the right hilum related to adenopathy unchanged. Electronically Signed   By: Fidela Salisbury MD   On: 11/12/2020 06:29   DG CHEST PORT 1 VIEW  Result Date: 11/11/2020 CLINICAL DATA:  Hemoptysis.  Pericardial effusion. EXAM: PORTABLE CHEST 1 VIEW COMPARISON:  11/10/2020.  CT 11/10/2020. FINDINGS: Mediastinum and right hilar fullness again noted consistent with previously visualized  adenopathy. Right base infiltrate again noted. Reference is made to recent chest CT report for discussion of extensive findings throughout the chest. No prominent pleural effusion. No pneumothorax. Pericardial drain in stable position. Heart size stable. No pulmonary venous congestion. Prior cervical spine fusion. Reference is made to chest CT report for discussion of bone lesions present. IMPRESSION: 1. Mediastinal and right hilar fullness again noted consistent with previously visualized adenopathy. Right base infiltrate again noted. No interim change. 2. Pericardial drain in stable position. No pneumothorax. Heart size stable. No pulmonary venous congestion. Chest is unchanged from prior exam. Reference is made to recent chest CT report for discussion of extensive findings throughout the chest. Electronically Signed   By: Marcello Moores  Register   On: 11/11/2020 08:29   DG Chest Port 1 View  Result Date: 11/10/2020 CLINICAL DATA:  Pericardial effusion status post drainage. EXAM: PORTABLE CHEST 1 VIEW COMPARISON:  CT chest  from same day.  Chest x-ray from yesterday. FINDINGS: New pericardial drain. Normal heart size. Normal pulmonary vascularity. Unchanged right paratracheal and hilar fullness related to underlying lymphadenopathy. Emphysematous changes again noted. New patchy airspace disease at the right lung base. No pleural effusion or pneumothorax. IMPRESSION: 1. New pericardial drain. 2. New patchy airspace disease at the right lung base, suspicious for aspiration. 3. Unchanged mediastinal and right hilar lymphadenopathy. Electronically Signed   By: Titus Dubin M.D.   On: 11/10/2020 20:54   ECHOCARDIOGRAM COMPLETE  Result Date: 11/10/2020    ECHOCARDIOGRAM REPORT   Patient Name:   Brian Leonard Date of Exam: 11/10/2020 Medical Rec #:  [id number]      Height:       71.0 in Accession #:    [id number]     Weight:       136.0 lb Date of Birth:  28-Jan-1961      BSA:          1.790 m Patient Age:    73 years        BP:           112/79 mmHg Patient Gender: M              HR:           110 bpm. Exam Location:  Inpatient Procedure: 2D Echo Indications:    pericardial effusion  History:        Patient has no prior history of Echocardiogram examinations.                 Signs/Symptoms:Shortness of Breath.  Sonographer:    Johny Chess Referring Phys: [id number] [place]  1. Left ventricular ejection fraction, by estimation, is 55 to 60%. The left ventricle has normal function. The left ventricle has no regional wall motion abnormalities. Indeterminate diastolic filling due to E-A fusion.  2. Right ventricular systolic function is normal. The right ventricular size is normal. The estimated right ventricular systolic pressure is 50.0 mmHg.  3. Moderate pericardial effusion. The pericardial effusion is circumferential. Findings are consistent with cardiac tamponade.  4. The mitral valve is normal in structure. No evidence of mitral valve regurgitation.  5. The aortic valve is tricuspid. Aortic valve regurgitation is not visualized. No aortic stenosis is present.  6. The inferior vena cava is dilated in size with <50% respiratory variability, suggesting right atrial pressure of 15 mmHg. FINDINGS  Left Ventricle: Left ventricular ejection fraction, by estimation, is 55 to 60%. The left ventricle has normal function. The left ventricle has no regional wall motion abnormalities. The left ventricular internal cavity size was normal in size. There is  no left ventricular hypertrophy. Indeterminate diastolic filling due to E-A fusion. Right Ventricle: The right ventricular size is normal. No increase in right ventricular wall thickness. Right ventricular systolic function is normal. The tricuspid regurgitant velocity is 1.57 m/s, and with an assumed right atrial pressure of 15 mmHg, the estimated right ventricular systolic pressure is 93.8 mmHg. Left Atrium: Left atrial size was normal in size. Right Atrium: Right  atrial size was normal in size. Pericardium: A moderately sized pericardial effusion is present. The pericardial effusion is circumferential. There is excessive respiratory variation in septal movement, diastolic collapse of the right ventricular free wall, diastolic collapse of the right atrial wall, excessive respiratory variation in the mitral valve spectral Doppler velocities and excessive respiratory variation in the tricuspid valve spectral Doppler velocities. There is evidence of cardiac tamponade. Mitral Valve:  The mitral valve is normal in structure. No evidence of mitral valve regurgitation. Tricuspid Valve: The tricuspid valve is normal in structure. Tricuspid valve regurgitation is not demonstrated. Aortic Valve: The aortic valve is tricuspid. Aortic valve regurgitation is not visualized. No aortic stenosis is present. Pulmonic Valve: The pulmonic valve was normal in structure. Pulmonic valve regurgitation is not visualized. Aorta: The aortic root is normal in size and structure. Venous: The inferior vena cava is dilated in size with less than 50% respiratory variability, suggesting right atrial pressure of 15 mmHg. IAS/Shunts: No atrial level shunt detected by color flow Doppler.  LEFT VENTRICLE PLAX 2D LVIDd:         4.20 cm Diastology LVIDs:         2.15 cm LV e' medial:    7.83 cm/s LV PW:         1.00 cm LV E/e' medial:  4.5 LV IVS:        0.90 cm LV e' lateral:   7.51 cm/s                        LV E/e' lateral: 4.7  RIGHT VENTRICLE             IVC RV S prime:     26.80 cm/s  IVC diam: 2.70 cm TAPSE (M-mode): 2.3 cm LEFT ATRIUM             Index       RIGHT ATRIUM          Index LA diam:        2.20 cm 1.23 cm/m  RA Area:     7.14 cm LA Vol (A2C):   16.3 ml 9.11 ml/m  RA Volume:   12.30 ml 6.87 ml/m LA Vol (A4C):   18.3 ml 10.22 ml/m LA Biplane Vol: 19.1 ml 10.67 ml/m  AORTIC VALVE LVOT Vmax:   66.60 cm/s LVOT Vmean:  44.300 cm/s LVOT VTI:    0.089 m  AORTA Ao Asc diam: 3.30 cm MV E velocity:  35.10 cm/s  TRICUSPID VALVE MV A velocity: 57.80 cm/s  TR Peak grad:   9.9 mmHg MV E/A ratio:  0.61        TR Vmax:        157.46 cm/s                             SHUNTS                            Systemic VTI: 0.09 m Dani Gobble Croitoru MD Electronically signed by Sanda Klein MD Signature Date/Time: 11/10/2020/6:07:36 PM    Final    CT HEAD CODE STROKE WO CONTRAST  Result Date: 11/23/2020 CLINICAL DATA:  Code stroke.  Right-sided weakness EXAM: CT HEAD WITHOUT CONTRAST TECHNIQUE: Contiguous axial images were obtained from the base of the skull through the vertex without intravenous contrast. COMPARISON:  None. FINDINGS: Brain: Brainstem appears normal. There appears to be an abnormal hyperdensity in the left cerebellum measuring approximately 1 cm in size. This could represent a cerebellar hemorrhage, hemorrhagic infarction or possibly a cavernoma. Cerebral hemispheres appear normal. No atrophy. No sign of acute infarction. No mass, hydrocephalus or extra-axial collection. Vascular: No abnormal vascular finding Skull: Normal Sinuses/Orbits: Clear/normal Other: None ASPECTS ([place] Stroke Program Early CT Score) - Ganglionic level infarction (caudate, lentiform nuclei, internal capsule, insula,  M1-M3 cortex): 7 - Supraganglionic infarction (M4-M6 cortex): 3 Total score (0-10 with 10 being normal): 10 IMPRESSION: 1. 1 cm hyperdensity in the left cerebellum. This could represent a cerebellar hemorrhage, hemorrhagic infarction or possibly a cavernoma. 2. ASPECTS is 10. 3. These results were communicated to Dr. Quinn Axe At 6:45 pmon 03/26/2022by telephone discussion. Electronically Signed   By: Nelson Chimes M.D.   On: 11/28/2020 18:50   VAS US CAROTID  Result Date: 2020-12-20 Carotid Arterial Duplex Study Indications:       CVA and Weakness. Risk Factors:      Hypertension. Other Factors:     Stage IV Metastatic lung cancer, new onset AFib. Comparison Study:  No prior study on file Performing Technologist: Sharion Dove RVS  Examination Guidelines: A complete evaluation includes B-mode imaging, spectral Doppler, color Doppler, and power Doppler as needed of all accessible portions of each vessel. Bilateral testing is considered an integral part of a complete examination. Limited examinations for reoccurring indications may be performed as noted.  Right Carotid Findings: +----------+--------+--------+--------+------------------+------------------+           PSV cm/sEDV cm/sStenosisPlaque DescriptionComments           +----------+--------+--------+--------+------------------+------------------+ CCA Prox  85      22                                intimal thickening +----------+--------+--------+--------+------------------+------------------+ CCA Distal84      25                                intimal thickening +----------+--------+--------+--------+------------------+------------------+ ICA Prox  70      14              homogeneous                          +----------+--------+--------+--------+------------------+------------------+ ICA Distal69      38                                                   +----------+--------+--------+--------+------------------+------------------+ ECA       47      11                                                   +----------+--------+--------+--------+------------------+------------------+ +----------+--------+-------+--------+-------------------+           PSV cm/sEDV cmsDescribeArm Pressure (mmHG) +----------+--------+-------+--------+-------------------+ [id number]                                         +----------+--------+-------+--------+-------------------+ +---------+--------+--+--------+--+ VertebralPSV cm/s45EDV cm/s20 +---------+--------+--+--------+--+  Left Carotid Findings: +----------+--------+--------+--------+------------------+------------------+           PSV cm/sEDV cm/sStenosisPlaque DescriptionComments            +----------+--------+--------+--------+------------------+------------------+ CCA Prox  78      26  intimal thickening +----------+--------+--------+--------+------------------+------------------+ CCA Distal50      22                                intimal thickening +----------+--------+--------+--------+------------------+------------------+ ICA Prox  72      28              homogeneous                          +----------+--------+--------+--------+------------------+------------------+ ICA Distal90      47                                                   +----------+--------+--------+--------+------------------+------------------+ ECA       76      18                                                   +----------+--------+--------+--------+------------------+------------------+ +----------+--------+--------+--------+-------------------+           PSV cm/sEDV cm/sDescribeArm Pressure (mmHG) +----------+--------+--------+--------+-------------------+ [id number]      2                                   +----------+--------+--------+--------+-------------------+ +---------+--------+--+--------+--+ VertebralPSV cm/s41EDV cm/s16 +---------+--------+--+--------+--+   Summary: Right Carotid: The extracranial vessels were near-normal with only minimal wall                thickening or plaque. Incidental DVT noted in the right                subclavian, axillary, and brachial veins. Left Carotid: The extracranial vessels were near-normal with only minimal wall               thickening or plaque. Incidental DVT noted in the left IJV,               subclavian, axillary, and brachial veins.  *See table(s) above for measurements and observations.     Preliminary    VAS [place] LOWER EXTREMITY VENOUS (DVT)  Result Date: 2020/11/26  Lower Venous DVT Study Indications: Stroke.  Risk Factors: Cancer Stage IV metastatic lung cancer.  Comparison Study: No prior study Performing Technologist: Sharion Dove RVS  Examination Guidelines: A complete evaluation includes B-mode imaging, spectral Doppler, color Doppler, and power Doppler as needed of all accessible portions of each vessel. Bilateral testing is considered an integral part of a complete examination. Limited examinations for reoccurring indications may be performed as noted. The reflux portion of the exam is performed with the patient in reverse Trendelenburg.  +---------+---------------+---------+-----------+----------+--------------+ RIGHT    CompressibilityPhasicitySpontaneityPropertiesThrombus Aging +---------+---------------+---------+-----------+----------+--------------+ CFV      Full                                         pulsatile flow +---------+---------------+---------+-----------+----------+--------------+ SFJ      Full                                                        +---------+---------------+---------+-----------+----------+--------------+  FV Prox  Full                                                        +---------+---------------+---------+-----------+----------+--------------+ FV Mid   Full                                                        +---------+---------------+---------+-----------+----------+--------------+ FV DistalFull                                                        +---------+---------------+---------+-----------+----------+--------------+ PFV      Full                                                        +---------+---------------+---------+-----------+----------+--------------+ POP      Partial        No       No                   Acute          +---------+---------------+---------+-----------+----------+--------------+ PTV      None                                         Acute          +---------+---------------+---------+-----------+----------+--------------+ PERO      None                                         Acute          +---------+---------------+---------+-----------+----------+--------------+   +---------+---------------+---------+-----------+----------+--------------+ LEFT     CompressibilityPhasicitySpontaneityPropertiesThrombus Aging +---------+---------------+---------+-----------+----------+--------------+ CFV      Full                                         pulsatile flow +---------+---------------+---------+-----------+----------+--------------+ SFJ      Full                                                        +---------+---------------+---------+-----------+----------+--------------+ FV Prox  Full                                                        +---------+---------------+---------+-----------+----------+--------------+ FV Mid   Full                                                        +---------+---------------+---------+-----------+----------+--------------+  FV DistalFull                                                        +---------+---------------+---------+-----------+----------+--------------+ PFV      Full                                                        +---------+---------------+---------+-----------+----------+--------------+ POP                     No       No                   Acute          +---------+---------------+---------+-----------+----------+--------------+ PTV      None                                         Acute          +---------+---------------+---------+-----------+----------+--------------+ PERO     None                                         Acute          +---------+---------------+---------+-----------+----------+--------------+     Summary: RIGHT: - Findings consistent with acute deep vein thrombosis involving the right popliteal vein, right posterior tibial veins, and right peroneal veins.  LEFT: - Findings consistent with acute  deep vein thrombosis involving the left popliteal vein, left posterior tibial veins, and left peroneal veins.  *See table(s) above for measurements and observations.    Preliminary     PHYSICAL EXAM  Temp:  [98 F (36.7 C)] 98 F (36.7 C) (03/19 0822) Pulse Rate:  [117-128] 121 (03/19 1658) Resp:  [18-31] 31 (03/19 0822) BP: (87-107)/(61-90) 104/61 (03/19 1658) SpO2:  [92 %-96 %] 95 % (03/19 1658)  General - Well nourished, well developed, in acute respiratory distress.  Ophthalmologic - fundi not visualized due to noncooperation.  Cardiovascular - Regular rhythm and rate.  Neuro - lethargic with SOB, tachypnea, discomfort. Eyes open on voice, eyes mid position slight movement to voice bilaterally but not tracking. Nonverbal and not following commands. Not blinking to visual threat bilaterally and right ptosis. Right nasolabial fold flattening. Spontaneously moving left UE and LE, but no movement right UE and LE. Sensation, coordination and gait not tested.   ASSESSMENT/PLAN Mr. Brian Leonard is a 60 y.o. male with history of stage IV lung cancer with liver metastasis, A. fib not on [facility] admitted for right-sided weakness, right facial droop after syncopal event. No tPA given due to hemorrhagic metastasis in the brain.    Stroke:  left MCA scattered/patchy infarcts, bilateral cerebellum ICH versus hemorrhagic conversion, L>R, embolic secondary to A. fib not on AC for hypercoagulable state from advanced malignancy   CT head showed left cerebellum hyperdense  MRI left MCA scattered/patchy infarct, left cerebellum ICH versus HT, small right cerebellum ICH versus HT  MRA left M3 occlusion  MRI with contrast  bilateral cerebellum, right occipital, left frontal metastasis  2D Echo EF 55 to 60% on 11/10/2020  LE venous Doppler bilateral DVT  CUS neg  LDL 88  HgbA1c 6.0  None for VTE prophylaxis to comfort care  No antithrombotic prior to admission, now on No antithrombotic for  comfort care  Disposition: Discussed with family at bedside and reviewed Dr. Virgie Dad note, agree with comfort care.  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 fib not on AC, was on amiodarone  Leukocytosis WBC 19.0, was on cefepime and vancomycin  Hypotension with tachycardia   Hospital day # 1  Neurology will sign off. Please call with questions. Thanks for the consult.  Rosalin Hawking, MD PhD Stroke Neurology 11-23-2020 8:54 PM    To contact Stroke Continuity provider, please refer to [email or web address] After hours, contact General Neurology

## 2020-12-04 NOTE — Progress Notes (Signed)
RN went to check pt and found patient not breathing, heart stopped. 2nd RN verified. Pt is DNR. Spouse notified. On call MD notified. Family returned and comfort provided. Family declined RN calling hospital chaplain, as family member is a [employer] park manager and is on the way to hospital. TOD 2025

## 2020-12-04 NOTE — Death Summary Note (Signed)
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  Diagnoses  Preliminary cause of death:  Secondary Diagnoses (including complications and co-morbidities):  Principal Problem:   CVA (cerebral vascular accident) ([place]) Active Problems:   Pericardial effusion   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 fib not on AC, asthma came to the ER with code stroke for evaluation of right-sided arm and leg weakness and facial droop.  Admitted here from 3/7-3/17/2022 for stage IV lung cancer with necrotic appearing pneumonia and sepsis, malignant pericardial effusion with tamponade status post drainage by CT surgery on 3/8 complicated by A. fib with RVR treated with amiodarone.  He was started on anticoagulation with bloody drainage.  Pericardial drain was discontinued 3/14.  Chest x-ray showed COPD with increasing right lower lobe opacity, CT head showed 1 cm hypodensity in left cerebellum with cerebellar hemorrhage, hemorrhagic infarction.  MRI brain showed patchy acute ischemic left MCA infarct associated with petechial hemorrhage.  MRI brain with contrast showed multiple ring-enhancing lesion  concerning for metastatic disease. Oncology was consulted, transitioned to comfort care.   Pertinent Labs and Studies  Significant Diagnostic Studies DG Chest 2 View  Result Date: 11/18/2020 CLINICAL DATA:  Cough and shortness of breath. EXAM: CHEST - 2 VIEW COMPARISON:  Chest x-ray from yesterday. FINDINGS: Normal heart size. Unchanged right hilar and upper mediastinal lymphadenopathy. The lungs remain hyperinflated with emphysematous changes. Unchanged small right greater than left pleural effusions with bibasilar atelectasis. IMPRESSION: 1. Unchanged small right greater than left pleural effusions with bibasilar atelectasis. 2. Unchanged thoracic lymphadenopathy. 3. COPD. Electronically Signed   By: Titus Dubin M.D.   On: 11/18/2020 10:59   DG Chest 2 View  Result Date: 11/17/2020 CLINICAL DATA:  Pericardial drain removed. EXAM: CHEST - 2 VIEW COMPARISON:  11/16/2020 and multiple previous FINDINGS: Pericardial drain is been removed. Lead artifacts overlie the mediastinum and heart. Small left effusion with left lower lobe volume loss. This is slightly improved. Small to moderate size right effusion with right lower lung volume loss, also improved. Emphysema and scarring again noted at both lung apices. IMPRESSION: Interval removal of pericardial drain. Slight improvement in small left effusion and left lower lobe volume loss. Small to moderate right effusion with right lower lung volume loss, also slightly improved. Electronically Signed   By: Nelson Chimes M.D.   On: 11/17/2020 08:47   DG Chest 2 View  Result Date: 11/09/2020 CLINICAL DATA:  Back pain and productive cough. EXAM: CHEST - 2 VIEW COMPARISON:  None. FINDINGS: Lungs are hyperexpanded. Soft tissue fullness noted in the right hilum. Left lung unremarkable. No pleural effusion. The visualized bony structures of the thorax show no acute abnormality. IMPRESSION: Soft tissue fullness in the right hilum. CT chest with contrast  recommended to  further evaluate as lymphadenopathy/neoplasm a concern. Electronically Signed   By: Misty Stanley M.D.   On: 11/09/2020 20:21   DG Abd 1 View  Result Date: 11/19/2020 CLINICAL DATA:  Abdomen pain EXAM: ABDOMEN - 1 VIEW COMPARISON:  CT 11/10/2020 FINDINGS: Small bilateral pleural effusions. Nonobstructed gas pattern with residual contrast in the right colon. No radiopaque calculi. IMPRESSION: Nonobstructed gas pattern. Electronically Signed   By: Donavan Foil M.D.   On: 11/19/2020 00:02   CT HEAD WO CONTRAST  Result Date: 2020-12-13 CLINICAL DATA:  60 year old male with multiple ring-enhancing cerebellar lesions, some hemorrhagic on MRI suspicious for metastatic disease. Superimposed patchy left MCA infarct on MRI. Increasing right side weakness. EXAM: CT HEAD WITHOUT CONTRAST TECHNIQUE: Contiguous axial images were obtained from the base of the skull through the vertex without intravenous contrast. COMPARISON:  Brain MRI 11/06/2020 and earlier. FINDINGS: Brain: Small left cerebellar hyperdense lesion seems less discrete since the CT on 11/18/2020 (series 4, image 9 today). No significant posterior fossa mass effect or cerebellar edema at this time. Cytotoxic edema is now evident at the left insula (series 4, image 15) and appears larger from the recent DWI. Cortical edema also evident in the lateral perirolandic area series 4, image 21. No hemorrhage or mass effect associated with these areas. Elsewhere stable gray-white matter differentiation. No midline shift. No ventriculomegaly. No new intracranial hemorrhage identified. Vascular: Mild Calcified atherosclerosis at the skull base. No suspicious intracranial vascular hyperdensity. Skull: No acute or suspicious osseous lesion identified. Sinuses/Orbits: Visualized paranasal sinuses and mastoids are stable and well pneumatized. Other: Stable orbit and scalp soft tissues. Prominent superior ophthalmic veins again noted. IMPRESSION: 1. Mild expansion of the  left MCA territory ischemia at the insula since 11/19/2020. No hemorrhagic transformation or intracranial mass effect. 2. Left cerebellar hyperdense lesion appears mildly improved by CT since 11/09/2020. No new intracranial lesion or blood products identified. Electronically Signed   By: Genevie Ann M.D.   On: 12-13-2020 04:39   CT Chest W Contrast  Addendum Date: 11/10/2020   ADDENDUM REPORT: 11/10/2020 01:06 ADDENDUM: Additional small centrally necrotic appearing deposits appear to be located either along or adjacent the right hemidiaphragm, either within the lung base or subphrenic space (3/150). Addendum was called by telephone at the time of interpretation on 11/10/2020 at 1:06 am to provider GARRETT GREEN , who verbally acknowledged these results. Electronically Signed   By: Lovena Le M.D.   On: 11/10/2020 01:06   Result Date: 11/10/2020 CLINICAL DATA:  Abnormal radiograph, concern for mass EXAM: CT CHEST WITH CONTRAST TECHNIQUE: Multidetector CT imaging of the chest was performed during intravenous contrast administration. CONTRAST:  30mL OMNIPAQUE IOHEXOL 300 MG/ML  SOLN COMPARISON:  Radiograph 11/09/2020 FINDINGS: Cardiovascular: Normal cardiac size. Intermediate attenuation pericardial effusion, possibly malignant given the bulky mediastinal and hilar nodal disease. The aortic root is suboptimally assessed given cardiac pulsation artifact. The aorta is normal caliber. Aberrant right subclavian artery with small diverticulum of Kom[place]. Proximal great vessels are otherwise unremarkable. Central pulmonary arteries are top-normal caliber. No large central or lobar filling defects within limitations of this non tailored examination of the pulmonary arteries. Reflux of contrast into the IVC and hepatic veins. Hypoattenuating filling defect is seen extending from the within the left brachiocephalic vein 1/94-17, clear this reflects direct extension or merely thrombus in the setting of compression by the bulky  disease Mediastinum/Nodes: Extensive mediastinal, hilar and supraclavicular adenopathy throughout the chest bilaterally. Some central hypoattenuation concerning necrotic adenopathy.  Largest supraclavicular nodal deposit noted on the right measuring up to 3.1 x 3 cm in size (3/23). Paratracheal node measuring 3.3 x 1.9 cm (3/70). Right hilar deposit measuring 3.3 x 3.2 cm (3/90). Conglomerate adenopathy noted in the AP window and prevascular space as well. No acute abnormality of the trachea or esophagus. Hypoattenuating nodule in the thyroid isthmus measuring up to 1.7 cm in size. Lungs/Pleura: Centrally hypoattenuating lobular mass lesion seen in the posterosuperior segment right lower lobe measuring approximately 2.3 x 2.4 cm in size (4/93). Additional smaller nodules are seen throughout both lungs. Findings are on a background of diffuse centrilobular and paraseptal emphysematous changes as well as bronchitic features with airways thickening and scattered secretions. Regions of bandlike scarring and/or atelectasis are noted throughout both lungs. Trace right effusion with adjacent atelectasis. No left effusion. Upper Abdomen: Heterogeneous, largely hypoattenuating focus in the tail of the pancreas measuring 2.5 x 2.1 cm (3/21). Indeterminate hyperattenuating focus in the posterior right lobe liver measuring 1.3 x 1.3 cm (3/182), possible flash filling hemangioma though should be viewed with conspicuity given additional findings. No other acute abnormality in the upper abdomen. Musculoskeletal: Lucent lesion seen involving the T9 anterior inferior vertebral body with some mild soft tissue extension. Additional lucent lesion in the posterior left ninth rib (3/114) some ill defined sclerotic change in the right seventh rib laterally. No other consider acute or conspicuous osseous lesions. IMPRESSION: 1. Extensive centrally necrotic lymphadenopathy seen in the supraclavicular chains, mediastinum and hila, right  greater left with a necrotic appearing mass in the superior segment right lower lobe, numerous scattered solid micro nodules throughout both lungs, and intermediate attenuation likely malignant pericardial effusion. Differential considerations could include a primary pulmonary malignancy with widespread metastases versus metastatic disease from a partially visualized pancreatic tail lesion. Alternatively, atypical infection such as mycobacterial/tuberculosis should be excluded as well. 2. Trace right pleural effusion. 3. Reflux of contrast in the hepatic veins and IVC could reflect some elevated right heart pressures/right-sided dysfunction. Correlate with echocardiogram particularly given the presence pericardial effusion. 4. Hypoattenuating filling defect is seen in the left brachiocephalic vein, unclear if this could reflect extension from the nodal disease or merely bland thrombus with compression of the left brachiocephalic vein as it passes through the extensive prevascular deposits. No large central or lobar filling defects are seen within the pulmonary arteries though evaluation limited on this non tailored examination. 5. Lucent lesion involving the T9 anterior inferior vertebral body with some mild soft tissue extension. Additional lucent lesion in the posterior left ninth rib. Additional ill defined sclerotic change in the right seventh rib laterally. 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 6. Indeterminate hyperattenuating focus in the posterior right lobe liver measuring 1.3 x 1.3 cm, possible flash filling hemangioma though should be viewed with conspicuity given additional findings. 7. Hypoattenuating 1.7 cm nodule in the thyroid isthmus, Recommend thyroid US (ref: J Am Coll Radiol. 2015 Feb;12(2): 143-50). 8. Aberrant right subclavian artery. 9. Emphysema (ICD10-J43.9). These results were called by telephone at the time of interpretation on 11/10/2020 at 12:49 am to provider PA  McDonald, who verbally acknowledged these results. Electronically Signed: By: Lovena Le M.D. On: 11/10/2020 00:51   MR ANGIO HEAD WO CONTRAST  Result Date: 11/29/2020 CLINICAL DATA:  Follow-up examination for acute stroke. EXAM: MRI HEAD WITHOUT CONTRAST MRA HEAD WITHOUT CONTRAST TECHNIQUE: Multiplanar, multiecho pulse sequences of the brain and surrounding structures were obtained without intravenous contrast. Angiographic images of the head were obtained using MRA  technique without contrast. COMPARISON:  Prior CT from earlier the same day. FINDINGS: MRI HEAD FINDINGS Brain: Examination degraded by motion artifact. Generalized age-related cerebral atrophy. Multiple scattered foci of diffusion abnormality seen involving the cortical and subcortical aspect of the left cerebral hemisphere, with involvement of the left insula and overlying left frontal parietal cortex. Minimal patchy involvement of the left basal ganglia and subinsular white matter. Associated minimal petechial hemorrhage at the left insula without hemorrhagic transformation. 1.4 cm focus of susceptibility artifact at the left cerebellum, consistent with a small intraparenchymal hemorrhage, corresponding with abnormality on prior CT (series 18, image 14). Finding could reflect a hemorrhage or possibly hemorrhagic infarction. In additional smaller 5 mm hemorrhage seen at the contralateral right cerebellum (series 18, image 11). Mild localized edema without significant regional mass effect. No other evidence for acute or chronic intracranial hemorrhage. No other mass lesion, midline shift or mass effect. No hydrocephalus or extra-axial fluid collection. Pituitary gland suprasellar region within normal limits. Midline structures intact. Vascular: Major intracranial vascular flow voids are maintained. Skull and upper cervical spine: Craniocervical junction within normal limits. Normal bone marrow signal intensity. No focal marrow replacing lesion.  No scalp soft tissue abnormality. Sinuses/Orbits: Globes and orbital soft tissues within normal limits. Paranasal sinuses are clear. No significant mastoid effusion. Inner ear structures grossly normal. Other: None. MRA HEAD FINDINGS ANTERIOR CIRCULATION: Visualized distal cervical segments of the internal carotid arteries are patent with antegrade flow. Petrous, cavernous, and supraclinoid segments patent without stenosis. 3 mm focal outpouching arising from the supraclinoid right ICA could reflect a small vascular infundibulum related to a hypoplastic right PCOM versus small aneurysm (series 9, image 88). A1 segments patent bilaterally. Normal anterior communicating artery complex. Anterior cerebral arteries patent bilaterally. No M1 stenosis or occlusion. Normal MCA bifurcations. Distal right MCA branches well perfuse. On the left, susceptibility artifact is seen at the level of the right sylvian fissure, suspected to correspond with a distal right M3 occlusion, faintly seen on corresponding MRA (series 1070, image 18). Remainder of the distal left MCA branches perfused. POSTERIOR CIRCULATION: Visualized vertebral arteries widely patent to the vertebrobasilar junction. Both PICA origins patent and normal. Basilar patent to its distal aspect without stenosis. Superior cerebellar arteries patent bilaterally. 2 mm focal outpouching at the origin of the right SCA favored to reflect a small vascular infundibulum. Right PCA supplied via the basilar. Left PCA supplied via the basilar as well as a prominent left posterior communicating artery. Both PCAs well perfused to their distal aspects. IMPRESSION: MRI HEAD IMPRESSION: 1. 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 Associated faint petechial hemorrhage at the left insula without hemorrhagic transformation or significant mass effect. 2. 1.4 cm acute intraparenchymal hemorrhage at the left cerebellum, corresponding with abnormality on prior CT. This  could reflect a cerebellar hemorrhage or possibly hemorrhagic infarction. Additional smaller 5 mm hemorrhage at the contralateral right cerebellum. Clinical follow-up to resolution recommended to ensure no underlying lesions are present at these locations. MRA HEAD IMPRESSION: 1. Proximal left M3 occlusion, in keeping with the acute left MCA distribution infarct. 2. Otherwise wide patency of the major intracranial arterial circulation. No other proximal high-grade or correctable stenosis. 3. 3 mm outpouching arising from the supraclinoid right ICA, favored to reflect a small vascular infundibulum, although possible aneurysm difficult to exclude. Tension at follow-up recommended. Please note that an MRA neck was ordered for this exam, but was unable to be completed as the patient was unable to tolerate the full  length of the study. No tract to should be applied. Electronically Signed   By: Jeannine Boga M.D.   On: 11/18/2020 20:22   MR ANGIO NECK W WO CONTRAST  Result Date: 11/18/2020 CLINICAL DATA:  Follow-up examination for acute stroke. EXAM: MRI HEAD WITHOUT CONTRAST MRA HEAD WITHOUT CONTRAST TECHNIQUE: Multiplanar, multiecho pulse sequences of the brain and surrounding structures were obtained without intravenous contrast. Angiographic images of the head were obtained using MRA technique without contrast. COMPARISON:  Prior CT from earlier the same day. FINDINGS: MRI HEAD FINDINGS Brain: Examination degraded by motion artifact. Generalized age-related cerebral atrophy. Multiple scattered foci of diffusion abnormality seen involving the cortical and subcortical aspect of the left cerebral hemisphere, with involvement of the left insula and overlying left frontal parietal cortex. Minimal patchy involvement of the left basal ganglia and subinsular white matter. Associated minimal petechial hemorrhage at the left insula without hemorrhagic transformation. 1.4 cm focus of susceptibility artifact at the  left cerebellum, consistent with a small intraparenchymal hemorrhage, corresponding with abnormality on prior CT (series 18, image 14). Finding could reflect a hemorrhage or possibly hemorrhagic infarction. In additional smaller 5 mm hemorrhage seen at the contralateral right cerebellum (series 18, image 11). Mild localized edema without significant regional mass effect. No other evidence for acute or chronic intracranial hemorrhage. No other mass lesion, midline shift or mass effect. No hydrocephalus or extra-axial fluid collection. Pituitary gland suprasellar region within normal limits. Midline structures intact. Vascular: Major intracranial vascular flow voids are maintained. Skull and upper cervical spine: Craniocervical junction within normal limits. Normal bone marrow signal intensity. No focal marrow replacing lesion. No scalp soft tissue abnormality. Sinuses/Orbits: Globes and orbital soft tissues within normal limits. Paranasal sinuses are clear. No significant mastoid effusion. Inner ear structures grossly normal. Other: None. MRA HEAD FINDINGS ANTERIOR CIRCULATION: Visualized distal cervical segments of the internal carotid arteries are patent with antegrade flow. Petrous, cavernous, and supraclinoid segments patent without stenosis. 3 mm focal outpouching arising from the supraclinoid right ICA could reflect a small vascular infundibulum related to a hypoplastic right PCOM versus small aneurysm (series 9, image 88). A1 segments patent bilaterally. Normal anterior communicating artery complex. Anterior cerebral arteries patent bilaterally. No M1 stenosis or occlusion. Normal MCA bifurcations. Distal right MCA branches well perfuse. On the left, susceptibility artifact is seen at the level of the right sylvian fissure, suspected to correspond with a distal right M3 occlusion, faintly seen on corresponding MRA (series 1070, image 18). Remainder of the distal left MCA branches perfused. POSTERIOR  CIRCULATION: Visualized vertebral arteries widely patent to the vertebrobasilar junction. Both PICA origins patent and normal. Basilar patent to its distal aspect without stenosis. Superior cerebellar arteries patent bilaterally. 2 mm focal outpouching at the origin of the right SCA favored to reflect a small vascular infundibulum. Right PCA supplied via the basilar. Left PCA supplied via the basilar as well as a prominent left posterior communicating artery. Both PCAs well perfused to their distal aspects. IMPRESSION: MRI HEAD IMPRESSION: 1. 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 Associated faint petechial hemorrhage at the left insula without hemorrhagic transformation or significant mass effect. 2. 1.4 cm acute intraparenchymal hemorrhage at the left cerebellum, corresponding with abnormality on prior CT. This could reflect a cerebellar hemorrhage or possibly hemorrhagic infarction. Additional smaller 5 mm hemorrhage at the contralateral right cerebellum. Clinical follow-up to resolution recommended to ensure no underlying lesions are present at these locations. MRA HEAD IMPRESSION: 1. Proximal left M3 occlusion, in  keeping with the acute left MCA distribution infarct. 2. Otherwise wide patency of the major intracranial arterial circulation. No other proximal high-grade or correctable stenosis. 3. 3 mm outpouching arising from the supraclinoid right ICA, favored to reflect a small vascular infundibulum, although possible aneurysm difficult to exclude. Tension at follow-up recommended. Please note that an MRA neck was ordered for this exam, but was unable to be completed as the patient was unable to tolerate the full length of the study. No tract to should be applied. Electronically Signed   By: Jeannine Boga M.D.   On: 11/15/2020 20:22   MR BRAIN WO CONTRAST  Result Date: 11/19/2020 CLINICAL DATA:  Follow-up examination for acute stroke. EXAM: MRI HEAD WITHOUT CONTRAST MRA HEAD  WITHOUT CONTRAST TECHNIQUE: Multiplanar, multiecho pulse sequences of the brain and surrounding structures were obtained without intravenous contrast. Angiographic images of the head were obtained using MRA technique without contrast. COMPARISON:  Prior CT from earlier the same day. FINDINGS: MRI HEAD FINDINGS Brain: Examination degraded by motion artifact. Generalized age-related cerebral atrophy. Multiple scattered foci of diffusion abnormality seen involving the cortical and subcortical aspect of the left cerebral hemisphere, with involvement of the left insula and overlying left frontal parietal cortex. Minimal patchy involvement of the left basal ganglia and subinsular white matter. Associated minimal petechial hemorrhage at the left insula without hemorrhagic transformation. 1.4 cm focus of susceptibility artifact at the left cerebellum, consistent with a small intraparenchymal hemorrhage, corresponding with abnormality on prior CT (series 18, image 14). Finding could reflect a hemorrhage or possibly hemorrhagic infarction. In additional smaller 5 mm hemorrhage seen at the contralateral right cerebellum (series 18, image 11). Mild localized edema without significant regional mass effect. No other evidence for acute or chronic intracranial hemorrhage. No other mass lesion, midline shift or mass effect. No hydrocephalus or extra-axial fluid collection. Pituitary gland suprasellar region within normal limits. Midline structures intact. Vascular: Major intracranial vascular flow voids are maintained. Skull and upper cervical spine: Craniocervical junction within normal limits. Normal bone marrow signal intensity. No focal marrow replacing lesion. No scalp soft tissue abnormality. Sinuses/Orbits: Globes and orbital soft tissues within normal limits. Paranasal sinuses are clear. No significant mastoid effusion. Inner ear structures grossly normal. Other: None. MRA HEAD FINDINGS ANTERIOR CIRCULATION: Visualized  distal cervical segments of the internal carotid arteries are patent with antegrade flow. Petrous, cavernous, and supraclinoid segments patent without stenosis. 3 mm focal outpouching arising from the supraclinoid right ICA could reflect a small vascular infundibulum related to a hypoplastic right PCOM versus small aneurysm (series 9, image 88). A1 segments patent bilaterally. Normal anterior communicating artery complex. Anterior cerebral arteries patent bilaterally. No M1 stenosis or occlusion. Normal MCA bifurcations. Distal right MCA branches well perfuse. On the left, susceptibility artifact is seen at the level of the right sylvian fissure, suspected to correspond with a distal right M3 occlusion, faintly seen on corresponding MRA (series 1070, image 18). Remainder of the distal left MCA branches perfused. POSTERIOR CIRCULATION: Visualized vertebral arteries widely patent to the vertebrobasilar junction. Both PICA origins patent and normal. Basilar patent to its distal aspect without stenosis. Superior cerebellar arteries patent bilaterally. 2 mm focal outpouching at the origin of the right SCA favored to reflect a small vascular infundibulum. Right PCA supplied via the basilar. Left PCA supplied via the basilar as well as a prominent left posterior communicating artery. Both PCAs well perfused to their distal aspects. IMPRESSION: MRI HEAD IMPRESSION: 1. 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 Associated faint petechial hemorrhage at the left insula without hemorrhagic transformation or significant mass effect. 2. 1.4 cm acute intraparenchymal hemorrhage at the left cerebellum, corresponding with abnormality on prior CT. This could reflect a cerebellar hemorrhage or possibly hemorrhagic infarction. Additional smaller 5 mm hemorrhage at the contralateral right cerebellum. Clinical follow-up to resolution recommended to ensure no underlying lesions are present at these locations. MRA HEAD  IMPRESSION: 1. Proximal left M3 occlusion, in keeping with the acute left MCA distribution infarct. 2. Otherwise wide patency of the major intracranial arterial circulation. No other proximal high-grade or correctable stenosis. 3. 3 mm outpouching arising from the supraclinoid right ICA, favored to reflect a small vascular infundibulum, although possible aneurysm difficult to exclude. Tension at follow-up recommended. Please note that an MRA neck was ordered for this exam, but was unable to be completed as the patient was unable to tolerate the full length of the study. No tract to should be applied. Electronically Signed   By: Jeannine Boga M.D.   On: 11/13/2020 20:22   MR BRAIN W CONTRAST  Result Date: 2020-12-03 CLINICAL DATA:  Follow-up examination for stroke. EXAM: MRI HEAD WITH CONTRAST TECHNIQUE: Multiplanar, multiecho pulse sequences of the brain and surrounding structures were obtained with intravenous contrast. CONTRAST:  65mL GADAVIST GADOBUTROL 1 MMOL/ML IV SOLN COMPARISON:  Prior MRI from earlier the same day. FINDINGS: Previously identified left cerebellar hemorrhage demonstrates ring enhancement, measuring 1.4 cm. Additional right cerebellar lesion also demonstrates ring enhancement, measuring 7 mm (series 6, image 14). There are a few additional subtle small ring-enhancing lesions at the right occipital lobe (series 7, images 7, 3). Largest of these additional lesions measures 7 mm. There is an additional 5 mm lesion at the high left frontal convexity (series 7, image 17). Probable additional punctate cortical lesion at the anterior left frontal lobe (series 9, image 21). Given the presence of multiple lesions, findings most consistent with intracranial metastatic disease, hemorrhagic in nature given previous exams. Mild edema about the cerebellar lesions. No other significant edema or regional mass effect. No other convincing pathologic enhancement on this motion degraded exam. IMPRESSION:  Multiple ring-enhancing lesions involving the bilateral cerebellar hemispheres, right occipital lobe, and high left frontal lobe as above, consistent with intracranial metastatic disease. Lesions at the cerebellum are hemorrhagic in nature given findings on prior exams. Dedicated metastatic workup recommended if no primary is known at this time. Electronically Signed   By: Jeannine Boga M.D.   On: 2020-12-03 00:19   CT ABDOMEN PELVIS W CONTRAST  Result Date: 11/10/2020 CLINICAL DATA:  Metastatic disease evaluation EXAM: CT ABDOMEN AND PELVIS WITH CONTRAST TECHNIQUE: Multidetector CT imaging of the abdomen and pelvis was performed using the standard protocol following bolus administration of intravenous contrast. CONTRAST:  123mL OMNIPAQUE IOHEXOL 300 MG/ML  SOLN COMPARISON:  Same-day CT chest, 11/10/2020 FINDINGS: Lower chest: Small right pleural effusion, increased compared to prior examination. Large pericardial effusion, similar to prior. Hepatobiliary: No solid liver abnormality is seen. Periportal edema. Pericholecystic fluid, nonspecific in the setting of ascites. No gallstones, gallbladder wall thickening, or biliary dilatation. Pancreas: Unremarkable. No pancreatic ductal dilatation or surrounding inflammatory changes. Spleen: Normal in size without significant abnormality. Adrenals/Urinary Tract: Adrenal glands are unremarkable. Kidneys are normal, without renal calculi, solid lesion, or hydronephrosis. Bladder is unremarkable. Stomach/Bowel: Stomach is within normal limits. Appendix appears normal. No evidence of bowel wall thickening, distention, or inflammatory changes. Vascular/Lymphatic: Aortic atherosclerosis. No enlarged abdominal or pelvic lymph nodes. Reproductive: No mass or  other significant abnormality. Other: No abdominal wall hernia or abnormality. Small volume ascites throughout the abdomen and pelvis. Musculoskeletal: Small lucent and rim sclerotic lesions noted in the pelvis, for  example in the right ilium (series 3, image 54, 57). IMPRESSION: 1. No CT evidence of lymphadenopathy soft tissue metastatic disease in the abdomen or pelvis. 2. Small lucent and rim sclerotic lesions noted in the bony pelvis, suspicious for subtle osseous metastatic disease given the presence of a presumed metastatic lesion of the T9 vertebral body. PET-CT is more sensitive for the detection of subtle metabolically active osseous metastatic disease. 3. Small volume ascites throughout the abdomen and pelvis, nonspecific. 4. Periportal edema and pericholecystic fluid, nonspecific in the setting of ascites. 5. Small right pleural effusion, increased compared to prior examination. 6. Large pericardial effusion, similar to prior examination. Aortic Atherosclerosis (ICD10-I70.0). Electronically Signed   By: Eddie Candle M.D.   On: 11/10/2020 12:46   DG Chest Portable 1 View  Result Date: 11/28/2020 CLINICAL DATA:  Altered mental status, cough EXAM: PORTABLE CHEST 1 VIEW COMPARISON:  11/18/2020 FINDINGS: Heart is normal size. Layering right pleural effusion. Right lower lobe airspace opacity increasing since prior study and obscuring the previously seen right lower lobe mass. Left base atelectasis. There is hyperinflation of the lungs compatible with COPD. No acute bony abnormality. IMPRESSION: COPD. Layering right pleural effusion. Increasing right lower lobe airspace opacity concerning for pneumonia. Electronically Signed   By: Rolm Baptise M.D.   On: 11/28/2020 20:34   DG Chest Port 1 View  Result Date: 11/16/2020 CLINICAL DATA:  Pericardial effusion.  Chest pain EXAM: PORTABLE CHEST 1 VIEW COMPARISON:  November 14, 2020 chest radiograph and chest CT FINDINGS: Pericardial drain in place. The heart size and pulmonary vascularity appear within normal limits. There are pleural effusions bilaterally with bibasilar atelectasis. Fullness in the right paratracheal region is stable, consistent with adenopathy noted on  recent CT. There is a degree of right hilar and azygos region adenopathy as well. No bone lesions. Postoperative change lower cervical region. IMPRESSION: Pericardial drain in place.  Heart size normal. Pleural effusions bilaterally with bibasilar atelectasis. Areas of adenopathy in the right paratracheal, azygos, and right hilar regions seen by radiography. Adenopathy in other areas better seen on CT. Electronically Signed   By: Lowella Grip III M.D.   On: 11/16/2020 07:59   DG CHEST PORT 1 VIEW  Result Date: 11/14/2020 CLINICAL DATA:  pain, chest pain and cough.  Tobacco use. EXAM: PORTABLE CHEST 1 VIEW COMPARISON:  Chest x-ray 11/13/2020, CT chest 11/10/2020, CT chest 11/10/2020 FINDINGS: Hyperinflation with cystic changes consistent with emphysema. The heart size and mediastinal contours are unchanged. Persistent right hilar prominence. Bilateral trace to small volume pleural effusions, likely right greater than left. Right lower lobe opacity. No pneumothorax. No acute osseous abnormality. IMPRESSION: 1. Similar findings with persistent right lower lung zone opacity as well as bilateral trace to small volume pleural effusions, likely right greater than left. 2.  Emphysema (ICD10-J43.9). Electronically Signed   By: Iven Finn M.D.   On: 11/14/2020 06:59   DG CHEST PORT 1 VIEW  Result Date: 11/13/2020 CLINICAL DATA:  Pericardial effusion EXAM: PORTABLE CHEST 1 VIEW COMPARISON:  November 12, 2020 FINDINGS: Apparent pericardial drain present, unchanged in position. Heart size within normal limits. Pulmonary vascularity within normal limits. There are pleural effusions bilaterally with consolidation in the left lower lobe. There is apparent bullous disease in the left apex region. No adenopathy evident. Postoperative change lower  cervical region. IMPRESSION: Apparent pericardial drain present. Heart size within normal limits. Pleural effusions bilaterally. Left lower lobe consolidation may in part be  due to atelectasis; a degree of superimposed pneumonia in the left lower lobe cannot be excluded. Bullous disease noted left apex. Electronically Signed   By: Lowella Grip III M.D.   On: 11/13/2020 08:32   DG CHEST PORT 1 VIEW  Result Date: 11/12/2020 CLINICAL DATA:  Exudative pericardial effusion status post pericardial window EXAM: PORTABLE CHEST 1 VIEW COMPARISON:  11/11/2020 FINDINGS: Right basilar consolidation and associated small right pleural effusion is stable. Mild interval improvement in interstitial infiltrate within the right upper lobe. No typical biapical cicatricial change or cavitary lesions identified to suggest granulomatous infection. Small left basilar pleural effusion is again noted. No pneumothorax. Cardiac size within normal limits. Right hilar enlargement again seen in keeping with right hilar adenopathy better seen on prior CT examination. IMPRESSION: Minimally improved right lung pulmonary infiltrate. Right basilar consolidation and associated small right pleural effusion again noted. Small left pleural effusion present. Enlargement of the right hilum related to adenopathy unchanged. Electronically Signed   By: Fidela Salisbury MD   On: 11/12/2020 06:29   DG CHEST PORT 1 VIEW  Result Date: 11/11/2020 CLINICAL DATA:  Hemoptysis.  Pericardial effusion. EXAM: PORTABLE CHEST 1 VIEW COMPARISON:  11/10/2020.  CT 11/10/2020. FINDINGS: Mediastinum and right hilar fullness again noted consistent with previously visualized adenopathy. Right base infiltrate again noted. Reference is made to recent chest CT report for discussion of extensive findings throughout the chest. No prominent pleural effusion. No pneumothorax. Pericardial drain in stable position. Heart size stable. No pulmonary venous congestion. Prior cervical spine fusion. Reference is made to chest CT report for discussion of bone lesions present. IMPRESSION: 1. Mediastinal and right hilar fullness again noted consistent with  previously visualized adenopathy. Right base infiltrate again noted. No interim change. 2. Pericardial drain in stable position. No pneumothorax. Heart size stable. No pulmonary venous congestion. Chest is unchanged from prior exam. Reference is made to recent chest CT report for discussion of extensive findings throughout the chest. Electronically Signed   By: Marcello Moores  Register   On: 11/11/2020 08:29   DG Chest Port 1 View  Result Date: 11/10/2020 CLINICAL DATA:  Pericardial effusion status post drainage. EXAM: PORTABLE CHEST 1 VIEW COMPARISON:  CT chest from same day.  Chest x-ray from yesterday. FINDINGS: New pericardial drain. Normal heart size. Normal pulmonary vascularity. Unchanged right paratracheal and hilar fullness related to underlying lymphadenopathy. Emphysematous changes again noted. New patchy airspace disease at the right lung base. No pleural effusion or pneumothorax. IMPRESSION: 1. New pericardial drain. 2. New patchy airspace disease at the right lung base, suspicious for aspiration. 3. Unchanged mediastinal and right hilar lymphadenopathy. Electronically Signed   By: Titus Dubin M.D.   On: 11/10/2020 20:54   ECHOCARDIOGRAM COMPLETE  Result Date: 11/10/2020    ECHOCARDIOGRAM REPORT   Patient Name:   WISSAM RESOR Date of Exam: 11/10/2020 Medical Rec #:  [id number]      Height:       71.0 in Accession #:    [id number]     Weight:       136.0 lb Date of Birth:  28-Jun-1961      BSA:          1.790 m Patient Age:    80 years       BP:           112/79 mmHg Patient Gender:  M              HR:           110 bpm. Exam Location:  Inpatient Procedure: 2D Echo Indications:    pericardial effusion  History:        Patient has no prior history of Echocardiogram examinations.                 Signs/Symptoms:Shortness of Breath.  Sonographer:    Johny Chess Referring Phys: [id number] [place]  1. Left ventricular ejection fraction, by estimation, is 55 to 60%. The left ventricle has  normal function. The left ventricle has no regional wall motion abnormalities. Indeterminate diastolic filling due to E-A fusion.  2. Right ventricular systolic function is normal. The right ventricular size is normal. The estimated right ventricular systolic pressure is 45.4 mmHg.  3. Moderate pericardial effusion. The pericardial effusion is circumferential. Findings are consistent with cardiac tamponade.  4. The mitral valve is normal in structure. No evidence of mitral valve regurgitation.  5. The aortic valve is tricuspid. Aortic valve regurgitation is not visualized. No aortic stenosis is present.  6. The inferior vena cava is dilated in size with <50% respiratory variability, suggesting right atrial pressure of 15 mmHg. FINDINGS  Left Ventricle: Left ventricular ejection fraction, by estimation, is 55 to 60%. The left ventricle has normal function. The left ventricle has no regional wall motion abnormalities. The left ventricular internal cavity size was normal in size. There is  no left ventricular hypertrophy. Indeterminate diastolic filling due to E-A fusion. Right Ventricle: The right ventricular size is normal. No increase in right ventricular wall thickness. Right ventricular systolic function is normal. The tricuspid regurgitant velocity is 1.57 m/s, and with an assumed right atrial pressure of 15 mmHg, the estimated right ventricular systolic pressure is 09.8 mmHg. Left Atrium: Left atrial size was normal in size. Right Atrium: Right atrial size was normal in size. Pericardium: A moderately sized pericardial effusion is present. The pericardial effusion is circumferential. There is excessive respiratory variation in septal movement, diastolic collapse of the right ventricular free wall, diastolic collapse of the right atrial wall, excessive respiratory variation in the mitral valve spectral Doppler velocities and excessive respiratory variation in the tricuspid valve spectral Doppler velocities. There  is evidence of cardiac tamponade. Mitral Valve: The mitral valve is normal in structure. No evidence of mitral valve regurgitation. Tricuspid Valve: The tricuspid valve is normal in structure. Tricuspid valve regurgitation is not demonstrated. Aortic Valve: The aortic valve is tricuspid. Aortic valve regurgitation is not visualized. No aortic stenosis is present. Pulmonic Valve: The pulmonic valve was normal in structure. Pulmonic valve regurgitation is not visualized. Aorta: The aortic root is normal in size and structure. Venous: The inferior vena cava is dilated in size with less than 50% respiratory variability, suggesting right atrial pressure of 15 mmHg. IAS/Shunts: No atrial level shunt detected by color flow Doppler.  LEFT VENTRICLE PLAX 2D LVIDd:         4.20 cm Diastology LVIDs:         2.15 cm LV e' medial:    7.83 cm/s LV PW:         1.00 cm LV E/e' medial:  4.5 LV IVS:        0.90 cm LV e' lateral:   7.51 cm/s                        LV  E/e' lateral: 4.7  RIGHT VENTRICLE             IVC RV S prime:     26.80 cm/s  IVC diam: 2.70 cm TAPSE (M-mode): 2.3 cm LEFT ATRIUM             Index       RIGHT ATRIUM          Index LA diam:        2.20 cm 1.23 cm/m  RA Area:     7.14 cm LA Vol (A2C):   16.3 ml 9.11 ml/m  RA Volume:   12.30 ml 6.87 ml/m LA Vol (A4C):   18.3 ml 10.22 ml/m LA Biplane Vol: 19.1 ml 10.67 ml/m  AORTIC VALVE LVOT Vmax:   66.60 cm/s LVOT Vmean:  44.300 cm/s LVOT VTI:    0.089 m  AORTA Ao Asc diam: 3.30 cm MV E velocity: 35.10 cm/s  TRICUSPID VALVE MV A velocity: 57.80 cm/s  TR Peak grad:   9.9 mmHg MV E/A ratio:  0.61        TR Vmax:        157.46 cm/s                             SHUNTS                            Systemic VTI: 0.09 m Dani Gobble Croitoru MD Electronically signed by Sanda Klein MD Signature Date/Time: 11/10/2020/6:07:36 PM    Final    CT HEAD CODE STROKE WO CONTRAST  Result Date: 11/04/2020 CLINICAL DATA:  Code stroke.  Right-sided weakness EXAM: CT HEAD WITHOUT CONTRAST  TECHNIQUE: Contiguous axial images were obtained from the base of the skull through the vertex without intravenous contrast. COMPARISON:  None. FINDINGS: Brain: Brainstem appears normal. There appears to be an abnormal hyperdensity in the left cerebellum measuring approximately 1 cm in size. This could represent a cerebellar hemorrhage, hemorrhagic infarction or possibly a cavernoma. Cerebral hemispheres appear normal. No atrophy. No sign of acute infarction. No mass, hydrocephalus or extra-axial collection. Vascular: No abnormal vascular finding Skull: Normal Sinuses/Orbits: Clear/normal Other: None ASPECTS ([place] Stroke Program Early CT Score) - Ganglionic level infarction (caudate, lentiform nuclei, internal capsule, insula, M1-M3 cortex): 7 - Supraganglionic infarction (M4-M6 cortex): 3 Total score (0-10 with 10 being normal): 10 IMPRESSION: 1. 1 cm hyperdensity in the left cerebellum. This could represent a cerebellar hemorrhage, hemorrhagic infarction or possibly a cavernoma. 2. ASPECTS is 10. 3. These results were communicated to Dr. Quinn Axe At 6:45 pmon 03/16/2022by telephone discussion. Electronically Signed   By: Nelson Chimes M.D.   On: 11/13/2020 18:50   VAS US CAROTID  Result Date: 2020-12-14 Carotid Arterial Duplex Study Indications:       CVA and Weakness. Risk Factors:      Hypertension. Other Factors:     Stage IV Metastatic lung cancer, new onset AFib. Comparison Study:  No prior study on file Performing Technologist: Sharion Dove RVS  Examination Guidelines: A complete evaluation includes B-mode imaging, spectral Doppler, color Doppler, and power Doppler as needed of all accessible portions of each vessel. Bilateral testing is considered an integral part of a complete examination. Limited examinations for reoccurring indications may be performed as noted.  Right Carotid Findings: +----------+--------+--------+--------+------------------+------------------+             PSV cm/s EDV  cm/s Stenosis Plaque  Description Comments            +----------+--------+--------+--------+------------------+------------------+  CCA Prox   85       22                                   intimal thickening  +----------+--------+--------+--------+------------------+------------------+  CCA Distal 84       25                                   intimal thickening  +----------+--------+--------+--------+------------------+------------------+  ICA Prox   70       14                homogeneous                            +----------+--------+--------+--------+------------------+------------------+  ICA Distal 69       38                                                       +----------+--------+--------+--------+------------------+------------------+  ECA        47       11                                                       +----------+--------+--------+--------+------------------+------------------+ +----------+--------+-------+--------+-------------------+             PSV cm/s EDV cms Describe Arm Pressure (mmHG)  +----------+--------+-------+--------+-------------------+  Subclavian 82                                             +----------+--------+-------+--------+-------------------+ +---------+--------+--+--------+--+  Vertebral PSV cm/s 45 EDV cm/s 20  +---------+--------+--+--------+--+  Left Carotid Findings: +----------+--------+--------+--------+------------------+------------------+             PSV cm/s EDV cm/s Stenosis Plaque Description Comments            +----------+--------+--------+--------+------------------+------------------+  CCA Prox   78       26                                   intimal thickening  +----------+--------+--------+--------+------------------+------------------+  CCA Distal 50       22                                   intimal thickening  +----------+--------+--------+--------+------------------+------------------+  ICA Prox   72       28                homogeneous                             +----------+--------+--------+--------+------------------+------------------+  ICA Distal 90       47                                                       +----------+--------+--------+--------+------------------+------------------+  ECA        76       18                                                       +----------+--------+--------+--------+------------------+------------------+ +----------+--------+--------+--------+-------------------+             PSV cm/s EDV cm/s Describe Arm Pressure (mmHG)  +----------+--------+--------+--------+-------------------+  Subclavian 35       2                                      +----------+--------+--------+--------+-------------------+ +---------+--------+--+--------+--+  Vertebral PSV cm/s 41 EDV cm/s 16  +---------+--------+--+--------+--+   Summary: Right Carotid: The extracranial vessels were near-normal with only minimal wall                thickening or plaque. Incidental DVT noted in the right                subclavian, axillary, and brachial veins. Left Carotid: The extracranial vessels were near-normal with only minimal wall               thickening or plaque. Incidental DVT noted in the left IJV,               subclavian, axillary, and brachial veins.  *See table(s) above for measurements and observations.     Preliminary    VAS [place] LOWER EXTREMITY VENOUS (DVT)  Result Date: 11/22/2020  Lower Venous DVT Study Indications: Stroke.  Risk Factors: Cancer Stage IV metastatic lung cancer. Comparison Study: No prior study Performing Technologist: Sharion Dove RVS  Examination Guidelines: A complete evaluation includes B-mode imaging, spectral Doppler, color Doppler, and power Doppler as needed of all accessible portions of each vessel. Bilateral testing is considered an integral part of a complete examination. Limited examinations for reoccurring indications may be performed as noted. The reflux portion of the exam is performed with the patient in  reverse Trendelenburg.  +---------+---------------+---------+-----------+----------+--------------+  RIGHT     Compressibility Phasicity Spontaneity Properties Thrombus Aging  +---------+---------------+---------+-----------+----------+--------------+  CFV       Full                                             pulsatile flow  +---------+---------------+---------+-----------+----------+--------------+  SFJ       Full                                                             +---------+---------------+---------+-----------+----------+--------------+  FV Prox   Full                                                             +---------+---------------+---------+-----------+----------+--------------+  FV Mid    Full                                                             +---------+---------------+---------+-----------+----------+--------------+  FV Distal Full                                                             +---------+---------------+---------+-----------+----------+--------------+  PFV       Full                                                             +---------+---------------+---------+-----------+----------+--------------+  POP       Partial         No        No                     Acute           +---------+---------------+---------+-----------+----------+--------------+  PTV       None                                             Acute           +---------+---------------+---------+-----------+----------+--------------+  PERO      None                                             Acute           +---------+---------------+---------+-----------+----------+--------------+   +---------+---------------+---------+-----------+----------+--------------+  LEFT      Compressibility Phasicity Spontaneity Properties Thrombus Aging  +---------+---------------+---------+-----------+----------+--------------+  CFV       Full                                             pulsatile flow   +---------+---------------+---------+-----------+----------+--------------+  SFJ       Full                                                             +---------+---------------+---------+-----------+----------+--------------+  FV Prox   Full                                                             +---------+---------------+---------+-----------+----------+--------------+  FV Mid    Full                                                             +---------+---------------+---------+-----------+----------+--------------+  FV Distal Full                                                             +---------+---------------+---------+-----------+----------+--------------+  PFV       Full                                                             +---------+---------------+---------+-----------+----------+--------------+  POP                       No        No                     Acute           +---------+---------------+---------+-----------+----------+--------------+  PTV       None                                             Acute           +---------+---------------+---------+-----------+----------+--------------+  PERO      None                                             Acute           +---------+---------------+---------+-----------+----------+--------------+     Summary: RIGHT: - Findings consistent with acute deep vein thrombosis involving the right popliteal vein, right posterior tibial veins, and right peroneal veins.  LEFT: - Findings consistent with acute deep vein thrombosis involving the left popliteal vein, left posterior tibial veins, and left peroneal veins.  *See table(s) above for measurements and observations. Electronically signed by Ruta Hinds MD on 11/22/2020 at 11:14:25 AM.    Final     Microbiology Recent Results (from the past 240 hour(s))  Culture, blood (routine x 2)     Status: None (Preliminary result)   Collection Time: Nov 28, 2020  2:00 AM   Specimen: BLOOD  Result Value  Ref Range Status   Specimen Description BLOOD SITE NOT SPECIFIED  Final   Special Requests   Final    BOTTLES DRAWN AEROBIC AND ANAEROBIC Blood Culture adequate volume   Culture   Final    NO GROWTH 2 DAYS Performed at [facility], [address] [place], [place] [address]    Report Status PENDING  Incomplete  Culture, blood (routine x 2)     Status: None (Preliminary result)   Collection Time: 2020-11-28  2:43 AM   Specimen: BLOOD  Result Value Ref Range Status   Specimen Description BLOOD SITE NOT SPECIFIED  Final   Special Requests   Final    BOTTLES DRAWN AEROBIC AND ANAEROBIC Blood Culture adequate volume   Culture   Final    NO GROWTH 2 DAYS Performed at [facility], [address], [place], [place] [address]    Report  Status PENDING  Incomplete  SARS CORONAVIRUS 2 (TAT 6-24 HRS) Nasopharyngeal Nasopharyngeal Swab     Status: None   Collection Time: 12-19-20  8:50 AM   Specimen: Nasopharyngeal Swab  Result Value Ref Range Status   SARS Coronavirus 2 NEGATIVE NEGATIVE Final    Comment: (NOTE) SARS-CoV-2 target nucleic acids are NOT DETECTED.  The SARS-CoV-2 RNA is generally detectable in upper and lower respiratory specimens during the acute phase of infection. Negative results do not preclude SARS-CoV-2 infection, do not rule out co-infections with other pathogens, and should not be used as the sole basis for treatment or other patient management decisions. Negative results must be combined with clinical observations, patient history, and epidemiological information. The expected result is Negative.  Fact Sheet for Patients: SugarRoll.be  Fact Sheet for Healthcare Providers: https://www.woods-mathews.com/  This test is not yet approved or cleared by the [place] FDA and  has been authorized for detection and/or diagnosis of SARS-CoV-2 by FDA under an Emergency Use Authorization (EUA). This EUA will remain   in effect (meaning this test can be used) for the duration of the COVID-19 declaration under Se ction 564(b)(1) of the Act, 21 U.S.C. section 360bbb-3(b)(1), unless the authorization is terminated or revoked sooner.  Performed at [facility], [place] [address] [place], [place] [address]     Lab Basic Metabolic Panel: Recent Labs  Lab 11/17/20 0110 11/18/20 0023 11/18/20 1931 11/19/20 0037 11/05/2020 0850 11/22/2020 1828 11/24/2020 1833  NA 132* 132*  --  132* 131* 129* 130*  K 3.7 4.3  --  4.4 4.4 4.6 4.6  CL 97* 95*  --  96* 96* 93* 93*  CO2 27 28  --  25 23 22   --   GLUCOSE 113* 108*  --  124* 140* 165* 159*  BUN 10 11  --  12 18 24* 32*  CREATININE 0.88 0.89  --  0.87 1.06 1.52* 1.30*  CALCIUM 9.0 9.3  --  9.7 10.0 9.7  --   MG  --   --  1.9 1.8  --   --   --    Liver Function Tests: Recent Labs  Lab 11/19/20 0037 11/17/2020 0850 11/06/2020 1828  AST 18 17 34  ALT 17 16 23   ALKPHOS 105 140* 199*  BILITOT 0.9 0.7 0.8  PROT 5.8* 6.6 5.8*  ALBUMIN 2.9* 3.1* 2.9*   No results for input(s): LIPASE, AMYLASE in the last 168 hours. No results for input(s): AMMONIA in the last 168 hours. CBC: Recent Labs  Lab 11/18/20 0023 11/19/20 0037 11/14/2020 0850 11/18/2020 1828 11/16/2020 1833 December 19, 2020 0432  WBC 9.9 12.7* 17.6* 17.9*  --  19.0*  NEUTROABS  --  10.7* 15.5* 15.6*  --   --   HGB 11.1* 11.3* 11.1* 10.5* 10.9* 9.8*  HCT 31.7* 31.8* 32.4* 30.4* 32.0* 29.2*  MCV 94.3 92.7 92.6 94.7  --  95.7  PLT 371 341 290 262  --  208   Cardiac Enzymes: No results for input(s): CKTOTAL, CKMB, CKMBINDEX, TROPONINI in the last 168 hours. Sepsis Labs: Recent Labs  Lab 11/19/20 0037 11/06/2020 0850 11/30/2020 1828 12-19-20 0432 12-19-2020 0613  WBC 12.7* 17.6* 17.9* 19.0*  --   LATICACIDVEN  --   --   --   --  5.2*    Procedures/Operations  None   Shaiann Mcmanamon Chirag Odell Fasching 11/23/2020, 1:37 PM

## 2020-12-04 NOTE — H&P (Addendum)
History and Physical    Chandon Lazcano YQI:[id number] DOB: 11/05/1960 DOA: 12/02/2020  PCP: Patient, No Pcp Per Patient coming from: Home  Chief Complaint: Right-sided facial droop, right-sided weakness  HPI: Mete Purdum is a 60 y.o. male with medical history significant of recently diagnosed metastatic lung cancer, new onset A. fib not on anticoagulation, asthma presented to the ED as code stroke for evaluation of acute onset right-sided arm and leg weakness and right facial droop. He was at home with family when he had a syncopal episode followed by right-sided weakness.  Family immediately called EMS and he was brought in.  Seen by neurology on arrival.  Patient is not able to give any history, somnolent after receiving doses of Ativan in the ED for MRI.    Patient was recently admitted to the hospital from 11/09/2020 to 11/19/2020 and diagnosed with stage IV lung cancer with necrotic appearing pneumonia with sepsis physiology.  CT chest revealed extensive necrotic adenopathy and necrotic right lower lobe mass.  He also had malignant pericardial effusion with tamponade physiology and underwent pericardial window plus drainage by cardiothoracic surgery on 3/8.  He developed developed A. fib with RVR on 3/11 and was treated with amiodarone.  His CHA2DS2-VASc score is greater than 4 but he was not started on anticoagulation secondary to bloody drainage from pericardial window.  Pericardial drain was discontinued on 3/14.  Pathology confirmed lung as primary lesion for metastatic disease.  CT done during this admission also revealed hypoattenuating filling defect in the left brachiocephalic vein which it was unclear whether it reflected extension from nodal disease or bland thrombus with compression of the left brachiocephalic vein.  He was treated with prophylactic dose Lovenox.  ED Course: Afebrile.  Tachycardic and tachypneic.  Hypotensive with systolic in the 38V to 56E.  Not hypoxic.  Labs showing  WBC 33.2 with neutrophilic predominance, hemoglobin 10.5 (close to baseline), platelet count 262K.  Sodium 129, potassium 4.6, chloride 93, bicarb 22, BUN 24, creatinine 1.5 (baseline 0.8), glucose 165.  Alk phos 199, remainder of LFTs normal.  INR 1.5.  Screening Covid test pending.    Chest x-ray showing changes consistent with COPD and layering right pleural effusion.  Increasing right lower lobe airspace opacity concerning for pneumonia.    Head CT showing 1 cm hyperdensity in the left cerebellum which could represent a cerebellar hemorrhage, hemorrhagic infarction, or possibly a cavernoma.    Brain MRI without contrast showing patchy acute ischemic left MCA territory infarcts with associated faint petechial hemorrhage at the left insula without hemorrhagic transformation or significant mass-effect.  Also showing 1.4 cm acute intraparenchymal hemorrhage at the left cerebellum which could reflect a cerebellar hemorrhage or possibly hemorrhagic infarction.  Additional small 5 mm hemorrhage at the contralateral right cerebellum.    MRA head showing proximal left M3 occlusion but otherwise wide patency of the major intracranial arterial circulation.  No other proximal high-grade or correctable stenosis.  3 mm outpouching arising from the supraclinoid right ICA favored to reflect a small vascular infundibulum although possible aneurysm difficult to exclude.    Patient declined MR angio neck and was attempting to get out of the scanner and thus imaging was aborted.  Not a TPA candidate due to hemorrhagic mets.  MRI brain with contrast showing multiple ring-enhancing lesions involving the bilateral cerebellar hemispheres, right occipital lobe, and high left frontal lobe consistent with intracranial metastatic disease.  Lesions at the cerebellum are hemorrhagic in nature given findings on prior exams.  Patient  was given Ativan and 1 L normal saline bolus.  Review of Systems:  All systems reviewed and  apart from history of presenting illness, are negative.  Past Medical History:  Diagnosis Date  . Asthma     Past Surgical History:  Procedure Laterality Date  . CERVICAL SPINE SURGERY    . PERICARDIAL WINDOW N/A 11/10/2020   Procedure: PERICARDIAL WINDOW;  Surgeon: Gaye Pollack, MD;  Location: [facility] OR;  Service: Thoracic;  Laterality: N/A;     reports that he quit smoking 12 days ago. His smoking use included cigarettes. He has never used smokeless tobacco. He reports current alcohol use. He reports current drug use. Drug: Marijuana.  Allergies  Allergen Reactions  . Iodine Anaphylaxis  . Shellfish Allergy Anaphylaxis  . Other Hives and Swelling    Reaction to unknown oral asthma medication (pt has taken Theo Dur in the past - 10 yrs ago with no reaction)    History reviewed. No pertinent family history.  Prior to Admission medications   Medication Sig Start Date End Date Taking? Authorizing Provider  amiodarone (PACERONE) 200 MG tablet Take 1 tablet (200 mg total) by mouth every 12 (twelve) hours for 2 days. 11/19/20 2020-12-06 Yes Nita Sells, MD  amiodarone (PACERONE) 200 MG tablet Take 1 tablet (200 mg total) by mouth daily. 12-06-2020  Yes Nita Sells, MD  diphenhydrAMINE (BENADRYL) 25 mg capsule Take 1 capsule (25 mg total) by mouth at bedtime as needed for sleep. 11/19/20  Yes Nita Sells, MD  guaiFENesin (ROBITUSSIN) 100 MG/5ML SOLN Take 10 mLs (200 mg total) by mouth every 4 (four) hours as needed for cough or to loosen phlegm. 11/19/20  Yes Nita Sells, MD  HYDROmorphone HCl (DILAUDID) 1 MG/ML LIQD Take 1 mL (1 mg total) by mouth every 3 (three) hours as needed for severe pain. 11/19/20  Yes Domenic Polite, MD  levalbuterol [facility] HFA) 45 MCG/ACT inhaler Inhale 2 puffs into the lungs every 2 (two) hours as needed for wheezing. 11/19/20  Yes Nita Sells, MD  Lidocaine 0.5 % GEL Apply 1 each topically 3 (three) times daily. 11/19/20  Yes  Nita Sells, MD  Magnesium Oxide 400 (240 Mg) MG TABS Take 1 tablet (400 mg total) by mouth in the morning and at bedtime. 11/19/20  Yes Nita Sells, MD  metoprolol tartrate (LOPRESSOR) 25 MG tablet Take 1 tablet (25 mg total) by mouth 2 (two) times daily. 11/19/20  Yes Nita Sells, MD  Multiple Vitamin (MULTIVITAMIN WITH MINERALS) TABS tablet Take 1 tablet by mouth daily.   Yes [provider]  nicotine (NICODERM CQ - DOSED IN MG/24 HOURS) 14 mg/24hr patch Place 1 patch (14 mg total) onto the skin daily. 11/19/20  Yes Nita Sells, MD  oxyCODONE (OXYCONTIN) 15 mg 12 hr tablet Take 1 tablet (15 mg total) by mouth every 12 (twelve) hours. 11/19/20  Yes Domenic Polite, MD  pantoprazole (PROTONIX) 40 MG tablet Take 1 tablet (40 mg total) by mouth 2 (two) times daily. 11/19/20  Yes Nita Sells, MD  polyethylene glycol (MIRALAX / GLYCOLAX) 17 g packet Take 17 g by mouth daily. 11/19/20  Yes Nita Sells, MD  senna-docusate (SENOKOT-S) 8.6-50 MG tablet Take 1 tablet by mouth 2 (two) times daily. 11/19/20  Yes Nita Sells, MD  traMADol (ULTRAM) 50 MG tablet Take 1-2 tablets (50-100 mg total) by mouth every 6 (six) hours as needed (mild pain). Patient not taking: No sig reported 11/19/20   Domenic Polite, MD    Physical  Exam: Vitals:   2020-12-03 0030 2020-12-03 0045 2020-12-03 0100 December 03, 2020 0117  BP: 93/78  94/82 94/82  Pulse:    (!) 128  Resp: (!) 22 (!) 29 (!) 27 18  Temp:      TempSrc:      SpO2:    92%    Physical Exam Constitutional:      General: He is not in acute distress. HENT:     Head: Normocephalic and atraumatic.  Eyes:     Comments: Pupils unequal in size Left pupil brisk response to light Right pupil sluggish response to light  Cardiovascular:     Rate and Rhythm: Regular rhythm. Tachycardia present.     Pulses: Normal pulses.     Comments: Muffled heart sounds Pulmonary:     Breath sounds: Rales present. No  wheezing.     Comments: Slightly tachypneic Abdominal:     General: Bowel sounds are normal. There is no distension.     Palpations: Abdomen is soft.     Tenderness: There is no abdominal tenderness.  Musculoskeletal:        General: No swelling or tenderness.     Cervical back: Normal range of motion and neck supple.  Skin:    General: Skin is warm and dry.  Neurological:     Mental Status: He is alert.     Comments: Right upper and lower extremity weakness Right-sided facial droop     Labs on Admission: I have personally reviewed following labs and imaging studies  CBC: Recent Labs  Lab 11/16/20 0106 11/18/20 0023 11/19/20 0037 11/15/2020 0850 11/16/2020 1828 11/03/2020 1833  WBC 8.7 9.9 12.7* 17.6* 17.9*  --   NEUTROABS  --   --  10.7* 15.5* 15.6*  --   HGB 11.0* 11.1* 11.3* 11.1* 10.5* 10.9*  HCT 31.9* 31.7* 31.8* 32.4* 30.4* 32.0*  MCV 93.3 94.3 92.7 92.6 94.7  --   PLT 344 371 341 290 262  --    Basic Metabolic Panel: Recent Labs  Lab 11/17/20 0110 11/18/20 0023 11/18/20 1931 11/19/20 0037 11/09/2020 0850 11/16/2020 1828 11/16/2020 1833  NA 132* 132*  --  132* 131* 129* 130*  K 3.7 4.3  --  4.4 4.4 4.6 4.6  CL 97* 95*  --  96* 96* 93* 93*  CO2 27 28  --  25 23 22   --   GLUCOSE 113* 108*  --  124* 140* 165* 159*  BUN 10 11  --  12 18 24* 32*  CREATININE 0.88 0.89  --  0.87 1.06 1.52* 1.30*  CALCIUM 9.0 9.3  --  9.7 10.0 9.7  --   MG  --   --  1.9 1.8  --   --   --    GFR: Estimated Creatinine Clearance: 52.5 mL/min (A) (by C-G formula based on SCr of 1.3 mg/dL (H)). Liver Function Tests: Recent Labs  Lab 11/19/20 0037 11/13/2020 0850 11/26/2020 1828  AST 18 17 34  ALT 17 16 23   ALKPHOS 105 140* 199*  BILITOT 0.9 0.7 0.8  PROT 5.8* 6.6 5.8*  ALBUMIN 2.9* 3.1* 2.9*   No results for input(s): LIPASE, AMYLASE in the last 168 hours. No results for input(s): AMMONIA in the last 168 hours. Coagulation Profile: Recent Labs  Lab 11/08/2020 1828  INR 1.5*    Cardiac Enzymes: No results for input(s): CKTOTAL, CKMB, CKMBINDEX, TROPONINI in the last 168 hours. BNP (last 3 results) No results for input(s): PROBNP in the last 8760 hours.  HbA1C: No results for input(s): HGBA1C in the last 72 hours. CBG: Recent Labs  Lab 11/18/20 1109 11/18/20 1539 11/18/20 2133 11/19/20 0601 11/12/2020 1825  GLUCAP 109* 117* 122* 122* 153*   Lipid Profile: No results for input(s): CHOL, HDL, LDLCALC, TRIG, CHOLHDL, LDLDIRECT in the last 72 hours. Thyroid Function Tests: No results for input(s): TSH, T4TOTAL, FREET4, T3FREE, THYROIDAB in the last 72 hours. Anemia Panel: No results for input(s): VITAMINB12, FOLATE, FERRITIN, TIBC, IRON, RETICCTPCT in the last 72 hours. Urine analysis: No results found for: COLORURINE, APPEARANCEUR, LABSPEC, PHURINE, GLUCOSEU, HGBUR, BILIRUBINUR, KETONESUR, PROTEINUR, UROBILINOGEN, NITRITE, LEUKOCYTESUR  Radiological Exams on Admission: MR ANGIO HEAD WO CONTRAST  Result Date: 11/25/2020 CLINICAL DATA:  Follow-up examination for acute stroke. EXAM: MRI HEAD WITHOUT CONTRAST MRA HEAD WITHOUT CONTRAST TECHNIQUE: Multiplanar, multiecho pulse sequences of the brain and surrounding structures were obtained without intravenous contrast. Angiographic images of the head were obtained using MRA technique without contrast. COMPARISON:  Prior CT from earlier the same day. FINDINGS: MRI HEAD FINDINGS Brain: Examination degraded by motion artifact. Generalized age-related cerebral atrophy. Multiple scattered foci of diffusion abnormality seen involving the cortical and subcortical aspect of the left cerebral hemisphere, with involvement of the left insula and overlying left frontal parietal cortex. Minimal patchy involvement of the left basal ganglia and subinsular white matter. Associated minimal petechial hemorrhage at the left insula without hemorrhagic transformation. 1.4 cm focus of susceptibility artifact at the left cerebellum, consistent  with a small intraparenchymal hemorrhage, corresponding with abnormality on prior CT (series 18, image 14). Finding could reflect a hemorrhage or possibly hemorrhagic infarction. In additional smaller 5 mm hemorrhage seen at the contralateral right cerebellum (series 18, image 11). Mild localized edema without significant regional mass effect. No other evidence for acute or chronic intracranial hemorrhage. No other mass lesion, midline shift or mass effect. No hydrocephalus or extra-axial fluid collection. Pituitary gland suprasellar region within normal limits. Midline structures intact. Vascular: Major intracranial vascular flow voids are maintained. Skull and upper cervical spine: Craniocervical junction within normal limits. Normal bone marrow signal intensity. No focal marrow replacing lesion. No scalp soft tissue abnormality. Sinuses/Orbits: Globes and orbital soft tissues within normal limits. Paranasal sinuses are clear. No significant mastoid effusion. Inner ear structures grossly normal. Other: None. MRA HEAD FINDINGS ANTERIOR CIRCULATION: Visualized distal cervical segments of the internal carotid arteries are patent with antegrade flow. Petrous, cavernous, and supraclinoid segments patent without stenosis. 3 mm focal outpouching arising from the supraclinoid right ICA could reflect a small vascular infundibulum related to a hypoplastic right PCOM versus small aneurysm (series 9, image 88). A1 segments patent bilaterally. Normal anterior communicating artery complex. Anterior cerebral arteries patent bilaterally. No M1 stenosis or occlusion. Normal MCA bifurcations. Distal right MCA branches well perfuse. On the left, susceptibility artifact is seen at the level of the right sylvian fissure, suspected to correspond with a distal right M3 occlusion, faintly seen on corresponding MRA (series 1070, image 18). Remainder of the distal left MCA branches perfused. POSTERIOR CIRCULATION: Visualized vertebral  arteries widely patent to the vertebrobasilar junction. Both PICA origins patent and normal. Basilar patent to its distal aspect without stenosis. Superior cerebellar arteries patent bilaterally. 2 mm focal outpouching at the origin of the right SCA favored to reflect a small vascular infundibulum. Right PCA supplied via the basilar. Left PCA supplied via the basilar as well as a prominent left posterior communicating artery. Both PCAs well perfused to their distal aspects. IMPRESSION: MRI HEAD IMPRESSION: 1. Patchy acute  ischemic left MCA distribution infarcts as above. Associated faint petechial hemorrhage at the left insula without hemorrhagic transformation or significant mass effect. 2. 1.4 cm acute intraparenchymal hemorrhage at the left cerebellum, corresponding with abnormality on prior CT. This could reflect a cerebellar hemorrhage or possibly hemorrhagic infarction. Additional smaller 5 mm hemorrhage at the contralateral right cerebellum. Clinical follow-up to resolution recommended to ensure no underlying lesions are present at these locations. MRA HEAD IMPRESSION: 1. Proximal left M3 occlusion, in keeping with the acute left MCA distribution infarct. 2. Otherwise wide patency of the major intracranial arterial circulation. No other proximal high-grade or correctable stenosis. 3. 3 mm outpouching arising from the supraclinoid right ICA, favored to reflect a small vascular infundibulum, although possible aneurysm difficult to exclude. Tension at follow-up recommended. Please note that an MRA neck was ordered for this exam, but was unable to be completed as the patient was unable to tolerate the full length of the study. No tract to should be applied. Electronically Signed   By: Jeannine Boga M.D.   On: 11/26/2020 20:22   MR ANGIO NECK W WO CONTRAST  Result Date: 11/29/2020 CLINICAL DATA:  Follow-up examination for acute stroke. EXAM: MRI HEAD WITHOUT CONTRAST MRA HEAD WITHOUT CONTRAST TECHNIQUE:  Multiplanar, multiecho pulse sequences of the brain and surrounding structures were obtained without intravenous contrast. Angiographic images of the head were obtained using MRA technique without contrast. COMPARISON:  Prior CT from earlier the same day. FINDINGS: MRI HEAD FINDINGS Brain: Examination degraded by motion artifact. Generalized age-related cerebral atrophy. Multiple scattered foci of diffusion abnormality seen involving the cortical and subcortical aspect of the left cerebral hemisphere, with involvement of the left insula and overlying left frontal parietal cortex. Minimal patchy involvement of the left basal ganglia and subinsular white matter. Associated minimal petechial hemorrhage at the left insula without hemorrhagic transformation. 1.4 cm focus of susceptibility artifact at the left cerebellum, consistent with a small intraparenchymal hemorrhage, corresponding with abnormality on prior CT (series 18, image 14). Finding could reflect a hemorrhage or possibly hemorrhagic infarction. In additional smaller 5 mm hemorrhage seen at the contralateral right cerebellum (series 18, image 11). Mild localized edema without significant regional mass effect. No other evidence for acute or chronic intracranial hemorrhage. No other mass lesion, midline shift or mass effect. No hydrocephalus or extra-axial fluid collection. Pituitary gland suprasellar region within normal limits. Midline structures intact. Vascular: Major intracranial vascular flow voids are maintained. Skull and upper cervical spine: Craniocervical junction within normal limits. Normal bone marrow signal intensity. No focal marrow replacing lesion. No scalp soft tissue abnormality. Sinuses/Orbits: Globes and orbital soft tissues within normal limits. Paranasal sinuses are clear. No significant mastoid effusion. Inner ear structures grossly normal. Other: None. MRA HEAD FINDINGS ANTERIOR CIRCULATION: Visualized distal cervical segments of the  internal carotid arteries are patent with antegrade flow. Petrous, cavernous, and supraclinoid segments patent without stenosis. 3 mm focal outpouching arising from the supraclinoid right ICA could reflect a small vascular infundibulum related to a hypoplastic right PCOM versus small aneurysm (series 9, image 88). A1 segments patent bilaterally. Normal anterior communicating artery complex. Anterior cerebral arteries patent bilaterally. No M1 stenosis or occlusion. Normal MCA bifurcations. Distal right MCA branches well perfuse. On the left, susceptibility artifact is seen at the level of the right sylvian fissure, suspected to correspond with a distal right M3 occlusion, faintly seen on corresponding MRA (series 1070, image 18). Remainder of the distal left MCA branches perfused. POSTERIOR CIRCULATION: Visualized vertebral arteries widely  patent to the vertebrobasilar junction. Both PICA origins patent and normal. Basilar patent to its distal aspect without stenosis. Superior cerebellar arteries patent bilaterally. 2 mm focal outpouching at the origin of the right SCA favored to reflect a small vascular infundibulum. Right PCA supplied via the basilar. Left PCA supplied via the basilar as well as a prominent left posterior communicating artery. Both PCAs well perfused to their distal aspects. IMPRESSION: MRI HEAD IMPRESSION: 1. 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 Associated faint petechial hemorrhage at the left insula without hemorrhagic transformation or significant mass effect. 2. 1.4 cm acute intraparenchymal hemorrhage at the left cerebellum, corresponding with abnormality on prior CT. This could reflect a cerebellar hemorrhage or possibly hemorrhagic infarction. Additional smaller 5 mm hemorrhage at the contralateral right cerebellum. Clinical follow-up to resolution recommended to ensure no underlying lesions are present at these locations. MRA HEAD IMPRESSION: 1. Proximal left M3  occlusion, in keeping with the acute left MCA distribution infarct. 2. Otherwise wide patency of the major intracranial arterial circulation. No other proximal high-grade or correctable stenosis. 3. 3 mm outpouching arising from the supraclinoid right ICA, favored to reflect a small vascular infundibulum, although possible aneurysm difficult to exclude. Tension at follow-up recommended. Please note that an MRA neck was ordered for this exam, but was unable to be completed as the patient was unable to tolerate the full length of the study. No tract to should be applied. Electronically Signed   By: Jeannine Boga M.D.   On: 11/05/2020 20:22   MR BRAIN WO CONTRAST  Result Date: 11/24/2020 CLINICAL DATA:  Follow-up examination for acute stroke. EXAM: MRI HEAD WITHOUT CONTRAST MRA HEAD WITHOUT CONTRAST TECHNIQUE: Multiplanar, multiecho pulse sequences of the brain and surrounding structures were obtained without intravenous contrast. Angiographic images of the head were obtained using MRA technique without contrast. COMPARISON:  Prior CT from earlier the same day. FINDINGS: MRI HEAD FINDINGS Brain: Examination degraded by motion artifact. Generalized age-related cerebral atrophy. Multiple scattered foci of diffusion abnormality seen involving the cortical and subcortical aspect of the left cerebral hemisphere, with involvement of the left insula and overlying left frontal parietal cortex. Minimal patchy involvement of the left basal ganglia and subinsular white matter. Associated minimal petechial hemorrhage at the left insula without hemorrhagic transformation. 1.4 cm focus of susceptibility artifact at the left cerebellum, consistent with a small intraparenchymal hemorrhage, corresponding with abnormality on prior CT (series 18, image 14). Finding could reflect a hemorrhage or possibly hemorrhagic infarction. In additional smaller 5 mm hemorrhage seen at the contralateral right cerebellum (series 18, image  11). Mild localized edema without significant regional mass effect. No other evidence for acute or chronic intracranial hemorrhage. No other mass lesion, midline shift or mass effect. No hydrocephalus or extra-axial fluid collection. Pituitary gland suprasellar region within normal limits. Midline structures intact. Vascular: Major intracranial vascular flow voids are maintained. Skull and upper cervical spine: Craniocervical junction within normal limits. Normal bone marrow signal intensity. No focal marrow replacing lesion. No scalp soft tissue abnormality. Sinuses/Orbits: Globes and orbital soft tissues within normal limits. Paranasal sinuses are clear. No significant mastoid effusion. Inner ear structures grossly normal. Other: None. MRA HEAD FINDINGS ANTERIOR CIRCULATION: Visualized distal cervical segments of the internal carotid arteries are patent with antegrade flow. Petrous, cavernous, and supraclinoid segments patent without stenosis. 3 mm focal outpouching arising from the supraclinoid right ICA could reflect a small vascular infundibulum related to a hypoplastic right PCOM versus small aneurysm (series 9, image  88). A1 segments patent bilaterally. Normal anterior communicating artery complex. Anterior cerebral arteries patent bilaterally. No M1 stenosis or occlusion. Normal MCA bifurcations. Distal right MCA branches well perfuse. On the left, susceptibility artifact is seen at the level of the right sylvian fissure, suspected to correspond with a distal right M3 occlusion, faintly seen on corresponding MRA (series 1070, image 18). Remainder of the distal left MCA branches perfused. POSTERIOR CIRCULATION: Visualized vertebral arteries widely patent to the vertebrobasilar junction. Both PICA origins patent and normal. Basilar patent to its distal aspect without stenosis. Superior cerebellar arteries patent bilaterally. 2 mm focal outpouching at the origin of the right SCA favored to reflect a small  vascular infundibulum. Right PCA supplied via the basilar. Left PCA supplied via the basilar as well as a prominent left posterior communicating artery. Both PCAs well perfused to their distal aspects. IMPRESSION: MRI HEAD IMPRESSION: 1. 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 Associated faint petechial hemorrhage at the left insula without hemorrhagic transformation or significant mass effect. 2. 1.4 cm acute intraparenchymal hemorrhage at the left cerebellum, corresponding with abnormality on prior CT. This could reflect a cerebellar hemorrhage or possibly hemorrhagic infarction. Additional smaller 5 mm hemorrhage at the contralateral right cerebellum. Clinical follow-up to resolution recommended to ensure no underlying lesions are present at these locations. MRA HEAD IMPRESSION: 1. Proximal left M3 occlusion, in keeping with the acute left MCA distribution infarct. 2. Otherwise wide patency of the major intracranial arterial circulation. No other proximal high-grade or correctable stenosis. 3. 3 mm outpouching arising from the supraclinoid right ICA, favored to reflect a small vascular infundibulum, although possible aneurysm difficult to exclude. Tension at follow-up recommended. Please note that an MRA neck was ordered for this exam, but was unable to be completed as the patient was unable to tolerate the full length of the study. No tract to should be applied. Electronically Signed   By: Jeannine Boga M.D.   On: 11/16/2020 20:22   MR BRAIN W CONTRAST  Result Date: 07-Dec-2020 CLINICAL DATA:  Follow-up examination for stroke. EXAM: MRI HEAD WITH CONTRAST TECHNIQUE: Multiplanar, multiecho pulse sequences of the brain and surrounding structures were obtained with intravenous contrast. CONTRAST:  21m GADAVIST GADOBUTROL 1 MMOL/ML IV SOLN COMPARISON:  Prior MRI from earlier the same day. FINDINGS: Previously identified left cerebellar hemorrhage demonstrates ring enhancement,  measuring 1.4 cm. Additional right cerebellar lesion also demonstrates ring enhancement, measuring 7 mm (series 6, image 14). There are a few additional subtle small ring-enhancing lesions at the right occipital lobe (series 7, images 7, 3). Largest of these additional lesions measures 7 mm. There is an additional 5 mm lesion at the high left frontal convexity (series 7, image 17). Probable additional punctate cortical lesion at the anterior left frontal lobe (series 9, image 21). Given the presence of multiple lesions, findings most consistent with intracranial metastatic disease, hemorrhagic in nature given previous exams. Mild edema about the cerebellar lesions. No other significant edema or regional mass effect. No other convincing pathologic enhancement on this motion degraded exam. IMPRESSION: Multiple ring-enhancing lesions involving the bilateral cerebellar hemispheres, right occipital lobe, and high left frontal lobe as above, consistent with intracranial metastatic disease. Lesions at the cerebellum are hemorrhagic in nature given findings on prior exams. Dedicated metastatic workup recommended if no primary is known at this time. Electronically Signed   By: BJeannine BogaM.D.   On: 004-Apr-202200:19   DG Chest Portable 1 View  Result Date: 11/24/2020 CLINICAL  DATA:  Altered mental status, cough EXAM: PORTABLE CHEST 1 VIEW COMPARISON:  11/18/2020 FINDINGS: Heart is normal size. Layering right pleural effusion. Right lower lobe airspace opacity increasing since prior study and obscuring the previously seen right lower lobe mass. Left base atelectasis. There is hyperinflation of the lungs compatible with COPD. No acute bony abnormality. IMPRESSION: COPD. Layering right pleural effusion. Increasing right lower lobe airspace opacity concerning for pneumonia. Electronically Signed   By: Rolm Baptise M.D.   On: 11/06/2020 20:34   CT HEAD CODE STROKE WO CONTRAST  Result Date: 11/24/2020 CLINICAL  DATA:  Code stroke.  Right-sided weakness EXAM: CT HEAD WITHOUT CONTRAST TECHNIQUE: Contiguous axial images were obtained from the base of the skull through the vertex without intravenous contrast. COMPARISON:  None. FINDINGS: Brain: Brainstem appears normal. There appears to be an abnormal hyperdensity in the left cerebellum measuring approximately 1 cm in size. This could represent a cerebellar hemorrhage, hemorrhagic infarction or possibly a cavernoma. Cerebral hemispheres appear normal. No atrophy. No sign of acute infarction. No mass, hydrocephalus or extra-axial collection. Vascular: No abnormal vascular finding Skull: Normal Sinuses/Orbits: Clear/normal Other: None ASPECTS ([place] Stroke Program Early CT Score) - Ganglionic level infarction (caudate, lentiform nuclei, internal capsule, insula, M1-M3 cortex): 7 - Supraganglionic infarction (M4-M6 cortex): 3 Total score (0-10 with 10 being normal): 10 IMPRESSION: 1. 1 cm hyperdensity in the left cerebellum. This could represent a cerebellar hemorrhage, hemorrhagic infarction or possibly a cavernoma. 2. ASPECTS is 10. 3. These results were communicated to Dr. Quinn Axe At 6:45 pmon 03/17/2022by telephone discussion. Electronically Signed   By: Nelson Chimes M.D.   On: 11/15/2020 18:50    EKG: Independently reviewed.  EKG showing sinus rhythm, slight ST elevation in inferior and lateral leads.  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 fib.  His CHA2DS2-VASc score was greater than 4 but he was not started on anticoagulation due to also being diagnosed with malignant hemorrhagic pericardial effusion for which he  underwent pericardial window plus drainage on 3/8.  -Now presenting with acute onset right-sided facial droop and right arm and leg weakness.   -Head CT showing 1 cm hyperdensity in the left cerebellum which could represent a cerebellar hemorrhage, hemorrhagic infarction, or possibly a cavernoma.  -Brain MRI without contrast showing patchy acute ischemic left MCA territory infarcts with associated faint petechial hemorrhage at the left insula without hemorrhagic transformation or significant mass-effect.  Also showing 1.4 cm acute intraparenchymal hemorrhage at the left cerebellum which could reflect a cerebellar hemorrhage or possibly hemorrhagic infarction.  Additional small 5 mm hemorrhage at the contralateral right cerebellum.   -MRA head showing proximal left M3 occlusion but otherwise wide patency of the major intracranial arterial circulation.  No other proximal high-grade or correctable stenosis.  3 mm outpouching arising from the supraclinoid right ICA favored to reflect a small vascular infundibulum although possible aneurysm difficult to exclude.   -Patient declined MR angio neck and was attempting to get out of the scanner and thus imaging was aborted. -MRI brain with contrast showing multiple ring-enhancing lesions involving the bilateral cerebellar hemispheres, right occipital lobe, and high left frontal lobe consistent with intracranial metastatic disease.  Lesions at the cerebellum  are hemorrhagic in nature given findings on prior exams. -Not a TPA candidate due to hemorrhagic mets. -Neurology following, appreciate recommendations -Frequent neurochecks per stroke unit protocol -Carotid duplex -TTE -Lipid panel, A1c -Start statin if LDL greater than 70 -Holding off antithrombotic therapy given hemorrhagic mets.  Discussed with neurology -not comfortable with prophylactic dose Lovenox at this time.  Repeat CT head without contrast in 6 hours to assess stability of hemorrhagic  mets. -Routine EEG, seizure precautions -SBP goal -<160/90 -Telemetry monitoring for arrhythmia -Bedside swallow screen prior to p.o. intake -PT/OT/SLP eval  Addendum 4:45 AM: This morning patient's neurologic status worsened-worsening right-sided weakness and dysarthria.  Repeat head CT showing mild expansion of the left MCA territory ischemia at the insula since prior CT.  No hemorrhagic transformation or intracranial mass-effect.  Neurologist feels that the etiology is likely embolic from his A. fib.  He saw the patient and had a discussion with the patient's wife and explained that the risk of thrombectomy will be much greater than the benefit from this procedure.  Palliative care measures recommended and patient's wife understands but she wishes to speak to Dr. Julien Nordmann in the morning.  Pneumonia with sepsis physiology Acute hypoxemic respiratory failure Patient was treated for necrotic appearing pneumonia during his recent hospitalization.  Chest CT done at that time revealed extensive necrotic adenopathy and necrotic right lower lobe mass.  He is tachycardic and tachypneic.  Hypotensive with systolic as low as 03T.  Labs showing leukocytosis.  Chest x-ray showing increasing right lower lobe airspace opacity concerning for pneumonia.  Sats in the low 90s, placed on 2 L supplemental oxygen in the ED.  Slightly tachypneic.   -Vancomycin and cefepime ordered.  Blood culture x2 ordered.  Check lactic acid x2.  Monitor WBC count.  Patient has rales on exam, holding IV fluids until BNP is checked.  Blood pressure currently improved with systolic above 597.  Metastatic lung cancer: Recently diagnosed with metastatic adenocarcinoma involving right lower lobe lung nodule, massive mediastinal and supraclavicular lymphadenopathy, malignant pericardial effusion, mass lesion in the tail of the pancreas, and bony metastasis. Patient was seen by Dr. Earlie Server yesterday and it was felt that this was likely lung  cancer but also pancreatic cancer could not be completely excluded.  He had ordered additional staging work-up and plan was for the patient to follow-up in less than 2 weeks for reevaluation and more detailed discussion of his treatment options based on final staging work-up and molecular studies.  Oncology have discussed with the patient and his wife that he has an incurable condition and that all treatment options would be of palliative nature.  Imaging now showing brain mets as well. -Palliative care consult placed.  Please consult oncology in the morning.  Malignant pericardial effusion: Had tamponade physiology during recent hospitalization and underwent pericardial window plus drainage by cardiothoracic surgery on 3/8.  Pericardial drain was discontinued on 3/14.  There was concern today that his tachycardia and hypotension could be due to cardiac tamponade so I spoke to on-call cardiologist who saw the patient and did an urgent bedside ultrasound which revealed moderate pericardial effusion without signs of tamponade.  Tachycardia and hypotension likely due to sepsis. PE is on the differential for hypotension and tachycardia but unable to obtain CT angiogram chest as patient has high risk allergy to contrast (anaphylaxis).  Checking D-dimer level, although suspect it will be elevated given recently diagnosed brachiocephalic thrombus. -Routine echocardiogram ordered  Paroxysmal A. fib: Diagnosed with new onset A.  fib during recent hospitalization.  Rhythm is currently sinus tachycardia. -Not on anticoagulation due to hemorrhagic mets.  He was started on beta-blocker and amiodarone during his recent hospitalization.  Holding beta-blocker given hypotension.  Continue amiodarone.  Brachiocephalic thrombus: CT done during recent admission revealed hypoattenuating filling defect in the left brachiocephalic vein which it was unclear whether it reflected extension from nodal disease or bland thrombus with  compression of the left brachiocephalic vein.  He was treated with prophylactic dose Lovenox.  Now presenting with hemorrhagic stroke/brain mets. -Hold prophylactic low-dose Lovenox until repeat head CT is done to assess stability of hemorrhagic mets.  Mild hyponatremia: Likely due to malignancy.  Sodium 130 on repeat metabolic panel. -Continue to monitor closely  AKI: Repeat metabolic panel showing BUN 32, creatinine 1.3 (baseline 0.8). -Holding IV fluid as mentioned above until BNP is checked, continue to monitor renal function closely  Addendum: EKG showing slight ST elevation in inferior leads.  High-sensitivity troponin mildly elevated at 90, second set of troponin pending.  DVT prophylaxis: No chemical DVT prophylaxis at this time. Code Status: Full code Family Communication: No family available at this time. Disposition Plan: Status is: Inpatient  Remains inpatient appropriate because:Inpatient level of care appropriate due to severity of illness   Dispo: The patient is from: Home              Anticipated d/c is to: Home vs hospice              Patient currently is not medically stable to d/c.   Difficult to place patient No  Level of care: Level of care: Progressive   The medical decision making on this patient was of high complexity and the patient is at high risk for clinical deterioration, therefore this is a level 3 visit.  Shela Leff MD [facility]  If 7PM-7AM, please contact night-coverage [email or web address]  12/13/20, 3:35 AM

## 2020-12-04 DEATH — deceased

## 2020-12-09 ENCOUNTER — Other Ambulatory Visit (HOSPITAL_COMMUNITY): Payer: Self-pay

## 2020-12-14 ENCOUNTER — Ambulatory Visit: Payer: Self-pay | Admitting: Cardiovascular Disease

## 2020-12-23 ENCOUNTER — Inpatient Hospital Stay: Payer: Self-pay | Admitting: Nurse Practitioner

## 2020-12-24 ENCOUNTER — Encounter: Payer: Self-pay | Admitting: Dietician
